# Patient Record
Sex: Male | Born: 1945
Health system: Southern US, Community
[De-identification: ages and names within clinical notes are randomized; demographics above are authoritative.]

## PROBLEM LIST (undated history)

## (undated) DIAGNOSIS — G4710 Hypersomnia, unspecified: Secondary | ICD-10-CM

## (undated) DIAGNOSIS — I4891 Unspecified atrial fibrillation: Secondary | ICD-10-CM

## (undated) DIAGNOSIS — K635 Polyp of colon: Secondary | ICD-10-CM

## (undated) DIAGNOSIS — R0600 Dyspnea, unspecified: Secondary | ICD-10-CM

## (undated) DIAGNOSIS — S43429A Sprain of unspecified rotator cuff capsule, initial encounter: Secondary | ICD-10-CM

## (undated) DIAGNOSIS — R51 Headache: Secondary | ICD-10-CM

## (undated) DIAGNOSIS — S86012A Strain of left Achilles tendon, initial encounter: Secondary | ICD-10-CM

## (undated) DIAGNOSIS — I499 Cardiac arrhythmia, unspecified: Secondary | ICD-10-CM

## (undated) DIAGNOSIS — J329 Chronic sinusitis, unspecified: Secondary | ICD-10-CM

## (undated) DIAGNOSIS — K219 Gastro-esophageal reflux disease without esophagitis: Secondary | ICD-10-CM

## (undated) DIAGNOSIS — S8990XA Unspecified injury of unspecified lower leg, initial encounter: Secondary | ICD-10-CM

## (undated) DIAGNOSIS — L219 Seborrheic dermatitis, unspecified: Secondary | ICD-10-CM

## (undated) DIAGNOSIS — G47419 Narcolepsy without cataplexy: Secondary | ICD-10-CM

## (undated) DIAGNOSIS — M779 Enthesopathy, unspecified: Secondary | ICD-10-CM

## (undated) DIAGNOSIS — I340 Nonrheumatic mitral (valve) insufficiency: Secondary | ICD-10-CM

## (undated) DIAGNOSIS — I509 Heart failure, unspecified: Secondary | ICD-10-CM

## (undated) DIAGNOSIS — M1990 Unspecified osteoarthritis, unspecified site: Secondary | ICD-10-CM

## (undated) DIAGNOSIS — G43909 Migraine, unspecified, not intractable, without status migrainosus: Secondary | ICD-10-CM

## (undated) DIAGNOSIS — K5792 Diverticulitis of intestine, part unspecified, without perforation or abscess without bleeding: Secondary | ICD-10-CM

## (undated) DIAGNOSIS — L570 Actinic keratosis: Secondary | ICD-10-CM

## (undated) DIAGNOSIS — N189 Chronic kidney disease, unspecified: Secondary | ICD-10-CM

## (undated) DIAGNOSIS — R519 Headache, unspecified: Secondary | ICD-10-CM

## (undated) DIAGNOSIS — B159 Hepatitis A without hepatic coma: Secondary | ICD-10-CM

## (undated) DIAGNOSIS — G43109 Migraine with aura, not intractable, without status migrainosus: Secondary | ICD-10-CM

## (undated) DIAGNOSIS — M549 Dorsalgia, unspecified: Secondary | ICD-10-CM

## (undated) DIAGNOSIS — Z8719 Personal history of other diseases of the digestive system: Secondary | ICD-10-CM

## (undated) DIAGNOSIS — R52 Pain, unspecified: Secondary | ICD-10-CM

## (undated) DIAGNOSIS — B001 Herpesviral vesicular dermatitis: Secondary | ICD-10-CM

## (undated) HISTORY — PX: UPPER GASTROINTESTINAL ENDOSCOPY: SHX188

## (undated) HISTORY — DX: Migraine, unspecified, not intractable, without status migrainosus: G43.909

## (undated) HISTORY — DX: Migraine with aura, not intractable, without status migrainosus: G43.109

## (undated) HISTORY — DX: Seborrheic dermatitis, unspecified: L21.9

## (undated) HISTORY — DX: Herpesviral vesicular dermatitis: B00.1

## (undated) HISTORY — DX: Diverticulitis of intestine, part unspecified, without perforation or abscess without bleeding: K57.92

## (undated) HISTORY — DX: Hepatitis a without hepatic coma: B15.9

## (undated) HISTORY — DX: Dyspnea, unspecified: R06.00

## (undated) HISTORY — DX: Unspecified injury of unspecified lower leg, initial encounter: S89.90XA

## (undated) HISTORY — DX: Unspecified osteoarthritis, unspecified site: M19.90

## (undated) HISTORY — PX: COLONOSCOPY: SHX174

## (undated) HISTORY — DX: Gastro-esophageal reflux disease without esophagitis: K21.9

## (undated) HISTORY — DX: Pain, unspecified: R52

## (undated) HISTORY — PX: VASECTOMY: SHX75

## (undated) HISTORY — DX: Strain of left Achilles tendon, initial encounter: S86.012A

## (undated) HISTORY — PX: FINGER SURGERY: SHX640

## (undated) HISTORY — DX: Hypersomnia, unspecified: G47.10

## (undated) HISTORY — DX: Narcolepsy without cataplexy: G47.419

## (undated) HISTORY — DX: Headache, unspecified: R51.9

## (undated) HISTORY — PX: LASIK: SHX215

## (undated) HISTORY — DX: Dorsalgia, unspecified: M54.9

## (undated) HISTORY — DX: Actinic keratosis: L57.0

## (undated) HISTORY — DX: Sprain of unspecified rotator cuff capsule, initial encounter: S43.429A

## (undated) HISTORY — DX: Headache: R51

## (undated) HISTORY — DX: Enthesopathy, unspecified: M77.9

## (undated) HISTORY — DX: Chronic sinusitis, unspecified: J32.9

---
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
## 1983-09-14 DIAGNOSIS — B159 Hepatitis A without hepatic coma: Secondary | ICD-10-CM

## 1983-09-14 HISTORY — DX: Hepatitis a without hepatic coma: B15.9

## 1999-02-05 ENCOUNTER — Ambulatory Visit (HOSPITAL_COMMUNITY): Admission: RE | Admit: 1999-02-05 | Discharge: 1999-02-05 | Payer: Self-pay | Admitting: Gastroenterology

## 1999-07-08 ENCOUNTER — Encounter: Payer: Self-pay | Admitting: Neurology

## 1999-07-08 ENCOUNTER — Encounter: Admission: RE | Admit: 1999-07-08 | Discharge: 1999-07-08 | Payer: Self-pay | Admitting: Neurology

## 2000-09-13 DIAGNOSIS — S8990XA Unspecified injury of unspecified lower leg, initial encounter: Secondary | ICD-10-CM

## 2000-09-13 HISTORY — DX: Unspecified injury of unspecified lower leg, initial encounter: S89.90XA

## 2000-12-13 ENCOUNTER — Encounter: Admission: RE | Admit: 2000-12-13 | Discharge: 2000-12-13 | Payer: Self-pay | Admitting: Neurology

## 2000-12-13 ENCOUNTER — Encounter: Payer: Self-pay | Admitting: Neurology

## 2001-01-01 ENCOUNTER — Encounter: Payer: Self-pay | Admitting: Emergency Medicine

## 2001-01-01 ENCOUNTER — Emergency Department (HOSPITAL_COMMUNITY): Admission: EM | Admit: 2001-01-01 | Discharge: 2001-01-01 | Payer: Self-pay | Admitting: Emergency Medicine

## 2001-01-06 ENCOUNTER — Encounter: Payer: Self-pay | Admitting: Orthopaedic Surgery

## 2001-01-06 ENCOUNTER — Ambulatory Visit (HOSPITAL_COMMUNITY): Admission: RE | Admit: 2001-01-06 | Discharge: 2001-01-06 | Payer: Self-pay | Admitting: Orthopaedic Surgery

## 2004-06-05 ENCOUNTER — Ambulatory Visit (HOSPITAL_COMMUNITY): Admission: RE | Admit: 2004-06-05 | Discharge: 2004-06-05 | Payer: Self-pay | Admitting: Gastroenterology

## 2004-08-05 ENCOUNTER — Ambulatory Visit (HOSPITAL_COMMUNITY): Admission: RE | Admit: 2004-08-05 | Discharge: 2004-08-05 | Payer: Self-pay | Admitting: Gastroenterology

## 2004-12-29 ENCOUNTER — Encounter: Admission: RE | Admit: 2004-12-29 | Discharge: 2004-12-29 | Payer: Self-pay | Admitting: Diagnostic Radiology

## 2007-07-28 ENCOUNTER — Ambulatory Visit (HOSPITAL_BASED_OUTPATIENT_CLINIC_OR_DEPARTMENT_OTHER): Admission: RE | Admit: 2007-07-28 | Discharge: 2007-07-28 | Payer: Self-pay | Admitting: Orthopedic Surgery

## 2011-01-26 NOTE — Op Note (Signed)
NAME:  Christopher Weber, Christopher Weber               ACCOUNT NO.:  [id number]   MEDICAL RECORD NO.:  [id number]          PATIENT TYPE:  AMB   LOCATION:  DSC                          FACILITY:  [facility]   PHYSICIAN:  Katy Fitch. Sypher, M.D. DATE OF BIRTH:  27-Jun-1946   DATE OF PROCEDURE:  07/28/2007  DATE OF DISCHARGE:                               OPERATIVE REPORT   PREOPERATIVE DIAGNOSIS:  Chronic stenosing tenosynovitis of left long  finger at A1 pulley.   POSTOPERATIVE DIAGNOSIS:  Chronic stenosing tenosynovitis of left long  finger at A1 pulley.   OPERATION:  Release of left long finger A1 pulley under 2% lidocaine  field block anesthesia and sedation.   OPERATING SURGEON:  Katy Fitch. Sypher, M.D.   ASSISTANT:  Molly Maduro Dasnoit, PA-C   ANESTHESIA:  2% lidocaine flexor sheath block and palmar block, left  hand, to obtain anesthesia of the left long finger.   SUPERVISING ANESTHESIOLOGIST:  Janetta Hora. Gelene Mink, M.D.   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   He has had at least two prior steroid injections without longstanding  relief.   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   Due to episodes of awakening with his finger in a flexed posture and  difficulty with comfort while playing golf, he presents at this time for  release of his left long finger A1 pulley.   PROCEDURE:  [name] was brought to the operating room and placed in  the supine position on the operating table.   Following light sedation, the left arm was prepped with Betadine soap  and solution and sterilely draped.  A pneumatic tourniquet was applied  to the proximal brachium.   Following exsanguination of the left arm with Esmarch bandage, the  arterial tourniquet was inflated to 220 mmHg.  2% lidocaine was  infiltrated in the path of  the intended incision and into the flexor  sheath of the left long finger.   The procedure commenced with a short oblique incision in the distal  palmar crease just proximal to the A1 pulley.   The subcutaneous tissues were carefully divided, taking care to release  the pretendinous fibers of the palmar fascia to the long finger.  The A1  pulley was isolated and all soft tissues cleared with a Therapist, nutritional.  Blunt retractors were placed to protect the neurovascular structures.  The A1 pulley was then released along its radial border and a limited  synovectomy accomplished at the synovial reflection proximally.  Thereafter, Dr. Babs Sciara demonstrated full active range of motion was  finger with only minimal crepitation proximal to the A1 pulley.   The wound was then repaired with mattress suture of 5-0 nylon x3.   A compressive dressing was applied with Xeroflo, sterile gauze, and Ace  wrap.   There were no apparent complications.   For aftercare, we have encouraged him to continue to work on flexion and  extension  exercises for the next week.  He will return to our office for  followup in a week for dressing change, suture removal, and advancement  to an exercise program.      Katy Fitch. Sypher, M.D.  Electronically Signed     RVS/MEDQ  D:  07/28/2007  T:  07/28/2007  Job:  [id number]

## 2011-06-07 ENCOUNTER — Other Ambulatory Visit (INDEPENDENT_AMBULATORY_CARE_PROVIDER_SITE_OTHER): Payer: Self-pay | Admitting: General Surgery

## 2011-06-07 ENCOUNTER — Encounter (INDEPENDENT_AMBULATORY_CARE_PROVIDER_SITE_OTHER): Payer: Self-pay | Admitting: General Surgery

## 2011-06-07 ENCOUNTER — Ambulatory Visit (INDEPENDENT_AMBULATORY_CARE_PROVIDER_SITE_OTHER): Payer: BC Managed Care – PPO | Admitting: General Surgery

## 2011-06-07 VITALS — BP 130/88 | HR 60 | Temp 97.0°F | Resp 16 | Ht 69.0 in | Wt 171.1 lb

## 2011-06-07 DIAGNOSIS — D235 Other benign neoplasm of skin of trunk: Secondary | ICD-10-CM

## 2011-06-07 DIAGNOSIS — L723 Sebaceous cyst: Secondary | ICD-10-CM

## 2011-06-07 DIAGNOSIS — L720 Epidermal cyst: Secondary | ICD-10-CM

## 2011-06-07 NOTE — Patient Instructions (Signed)
Keep the wound clean and dry. Change the dry gauze dressing once or twice daily. Removedthe gauze packing in 48-672 hours and did not repack. You may shower the day after you remove the packing. The sutures should should be removed in 10-14 days. Call me if there are any problems.

## 2011-06-07 NOTE — Progress Notes (Signed)
Chief Complaint  Patient presents with  . Other    exision of cyst on back      HPI  Christopher Weber is a 65 y.o. male.    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  He says this has been there for some time and recently he has had 2 episodes where became very small swollen tender and drained. He called 2 or 3 weeks ago a set up an appointment for me to excise this in the office today.  He is otherwise very healthy. HPI  Past Medical History  Diagnosis Date  . Arthritis   . GERD (gastroesophageal reflux disease)   . Generalized headaches     Past Surgical History  Procedure Date  . Lasik   . Hernia repair 1963    right inguinal   . Tonsillectomy and adenoidectomy 1959  . Finger surgery 2009/2010    trigger finger of left hand      Family History  Problem Relation Age of Onset  . Heart disease Father 26    Social History History  Substance Use Topics  . Smoking status: Never Smoker   . Smokeless tobacco: Never Used  . Alcohol Use: 4.2 oz/week    7 Cans of beer per week    Allergies  Allergen Reactions  . Tetracyclines & Related     photosensitivity - can take medication but just must avoid being out in the sun     Current Outpatient Prescriptions  Medication Sig Dispense Refill  . ranitidine (ZANTAC) 150 MG tablet Take 150 mg by mouth daily.          Review of Systems Review of Systems  12 system review of systems is performed and is negative except as described above. Blood pressure 130/88, pulse 60, temperature 97 F (36.1 C), resp. rate 16, height 5\' 9"  (1.753 m), weight 171 lb 2 oz (77.622 kg).  Physical Exam Physical Exam  Constitutional: He appears well-developed and well-nourished. No distress.  HENT:  Head: Normocephalic and atraumatic.  Skin: Skin is warm and dry. No rash noted. He is not diaphoretic. No erythema. No pallor.       Data Reviewednone  Assessment    2 cm chronically infected epidermoid  cyst of MID back.    Plan    Informed consent was provided.  The patient was placed prone on the operating table.Marking pen was used to plan a transverse skin  Incision 4-5 cm. in length.The area was prepped with betadine in a sterile fashion. 1% xylocaine local. Transverse elliptical incision made and the cyst was completely excised. It was inspected I excised all of the cyst  The wounds irrigated with saline.The skin edges were closed loosely with 2 interrupted sutures of  3-0 nylon and packed loosely with iodoform gauze.Hemostasis was good. Dry gauze bandage was placed. Wound care was explained  Dry gauze bandage once or twice daily to expect some drainage  Remove gauze packing in 2 days. .Sutures should be removed in 10-14 days.  His wife is a home health care nurse and she will call if there are problems. Otherwise see me p.r.n.       Juel Bellerose M 06/07/2011, 2:26 PM

## 2011-06-14 ENCOUNTER — Telehealth (INDEPENDENT_AMBULATORY_CARE_PROVIDER_SITE_OTHER): Payer: Self-pay | Admitting: General Surgery

## 2011-06-14 NOTE — Telephone Encounter (Signed)
LM with path results on VM.

## 2012-08-29 ENCOUNTER — Other Ambulatory Visit: Payer: Self-pay | Admitting: Orthopedic Surgery

## 2012-08-30 NOTE — H&P (Signed)
  Patient is a 66 y/o right handed male who recently presented c/o increasing symptoms of STS of the right long finger for the past 2 months. He has tried splinting with out success. He has previously undergone release A-1 pulley of the left long finger in 2008 with excellent post-op results.  Exam of the right hand reveals active triggering of the right long finger. Neurovascular exam is intact. He has full ROM of the remaining digits without triggering. Normal sweat pattern. Impression: Stenosing tenosynovitis right long finger  Plan: To the OR for release A-1 pulley right long finger.The procedure, risks,benefits and post-op course were discussed with the patient at length and they were in agreement with the plan.   Jonni Sanger PA-C  H&P documentation: 08/31/2012  -History and Physical Reviewed  -Patient has been re-examined  -No change in the plan of care  Wyn Forster, MD

## 2012-08-31 ENCOUNTER — Encounter (HOSPITAL_BASED_OUTPATIENT_CLINIC_OR_DEPARTMENT_OTHER): Payer: Self-pay

## 2012-08-31 ENCOUNTER — Encounter (HOSPITAL_BASED_OUTPATIENT_CLINIC_OR_DEPARTMENT_OTHER)
Admission: RE | Disposition: A | Discharge status: Home / Self Care | Payer: Self-pay | Source: Ambulatory Visit | Attending: Orthopedic Surgery

## 2012-08-31 ENCOUNTER — Ambulatory Visit (HOSPITAL_BASED_OUTPATIENT_CLINIC_OR_DEPARTMENT_OTHER)
Admission: RE | Admit: 2012-08-31 | Discharge: 2012-08-31 | Disposition: A | Discharge status: Home / Self Care | Payer: BC Managed Care – PPO | Source: Ambulatory Visit | Attending: Orthopedic Surgery | Admitting: Orthopedic Surgery

## 2012-08-31 DIAGNOSIS — M6530 Trigger finger, unspecified finger: Secondary | ICD-10-CM | POA: Insufficient documentation

## 2012-08-31 HISTORY — PX: TRIGGER FINGER RELEASE: SHX641

## 2012-08-31 SURGERY — MINOR RELEASE TRIGGER FINGER/A-1 PULLEY
Anesthesia: LOCAL | Site: Finger | Laterality: Right | Wound class: Clean

## 2012-08-31 MED ORDER — LIDOCAINE HCL 2 % IJ SOLN
INTRAMUSCULAR | Status: DC | PRN
  Administered 2012-08-31: 4 mL

## 2012-08-31 MED ORDER — ACETAMINOPHEN-CODEINE #3 300-30 MG PO TABS: 1.0000 | ORAL_TABLET | ORAL | Status: DC | PRN

## 2012-08-31 MED ORDER — CHLORHEXIDINE GLUCONATE 4 % EX LIQD: 60.0000 mL | Freq: Once | CUTANEOUS | Status: DC

## 2012-08-31 SURGICAL SUPPLY — 38 items
BANDAGE ADHESIVE 1X3 (GAUZE/BANDAGES/DRESSINGS) IMPLANT
BANDAGE ELASTIC 3 VELCRO ST LF (GAUZE/BANDAGES/DRESSINGS) ×2 IMPLANT
BLADE SURG 15 STRL LF DISP TIS (BLADE) ×1 IMPLANT
BLADE SURG 15 STRL SS (BLADE) ×2
BNDG CMPR 9X4 STRL LF SNTH (GAUZE/BANDAGES/DRESSINGS)
BNDG ESMARK 4X9 LF (GAUZE/BANDAGES/DRESSINGS) IMPLANT
BRUSH SCRUB EZ PLAIN DRY (MISCELLANEOUS) ×2 IMPLANT
CLOTH BEACON ORANGE TIMEOUT ST (SAFETY) ×2 IMPLANT
CORDS BIPOLAR (ELECTRODE) IMPLANT
COVER MAYO STAND STRL (DRAPES) ×2 IMPLANT
COVER TABLE BACK 60X90 (DRAPES) ×2 IMPLANT
CUFF TOURNIQUET SINGLE 18IN (TOURNIQUET CUFF) ×1 IMPLANT
DECANTER SPIKE VIAL GLASS SM (MISCELLANEOUS) IMPLANT
DRAPE EXTREMITY T 121X128X90 (DRAPE) ×2 IMPLANT
DRAPE SURG 17X23 STRL (DRAPES) ×2 IMPLANT
GLOVE BIO SURGEON STRL SZ7.5 (GLOVE) ×2 IMPLANT
GLOVE BIOGEL M STRL SZ7.5 (GLOVE) ×2 IMPLANT
GLOVE BIOGEL PI IND STRL 8 (GLOVE) IMPLANT
GLOVE BIOGEL PI INDICATOR 8 (GLOVE) ×1
GLOVE ORTHO TXT STRL SZ7.5 (GLOVE) ×2 IMPLANT
GOWN PREVENTION PLUS XLARGE (GOWN DISPOSABLE) ×2 IMPLANT
GOWN PREVENTION PLUS XXLARGE (GOWN DISPOSABLE) ×4 IMPLANT
NEEDLE 27GAX1X1/2 (NEEDLE) ×1 IMPLANT
PACK BASIN DAY SURGERY FS (CUSTOM PROCEDURE TRAY) ×2 IMPLANT
PADDING CAST ABS 4INX4YD NS (CAST SUPPLIES) ×1
PADDING CAST ABS COTTON 4X4 ST (CAST SUPPLIES) ×1 IMPLANT
SPONGE GAUZE 4X4 12PLY (GAUZE/BANDAGES/DRESSINGS) ×2 IMPLANT
STOCKINETTE 4X48 STRL (DRAPES) ×2 IMPLANT
STRIP CLOSURE SKIN 1/2X4 (GAUZE/BANDAGES/DRESSINGS) IMPLANT
SUT ETHILON 5 0 P 3 18 (SUTURE)
SUT NYLON ETHILON 5-0 P-3 1X18 (SUTURE) IMPLANT
SUT PROLENE 3 0 PS 2 (SUTURE) IMPLANT
SUT PROLENE 4 0 P 3 18 (SUTURE) ×1 IMPLANT
SYR 3ML 23GX1 SAFETY (SYRINGE) IMPLANT
SYR CONTROL 10ML LL (SYRINGE) ×1 IMPLANT
TRAY DSU PREP LF (CUSTOM PROCEDURE TRAY) ×2 IMPLANT
UNDERPAD 30X30 INCONTINENT (UNDERPADS AND DIAPERS) ×2 IMPLANT
WATER STERILE IRR 1000ML POUR (IV SOLUTION) IMPLANT

## 2012-08-31 NOTE — Op Note (Signed)
[id number] 

## 2012-08-31 NOTE — Brief Op Note (Signed)
08/31/2012  2:06 PM  PATIENT:  Christopher Weber  66 y.o. male  PRE-OPERATIVE DIAGNOSIS:  STS Right Long Finger  POST-OPERATIVE DIAGNOSIS:  STS Right Long Finger  PROCEDURE:  Procedure(s) (LRB) with comments: MINOR RELEASE TRIGGER FINGER/A-1 PULLEY (Right) - right long finger  SURGEON:  Surgeon(s) and Role:    * Wyn Forster., MD - Primary  PHYSICIAN ASSISTANT:   ASSISTANTS: none   ANESTHESIA:   local  EBL:     BLOOD ADMINISTERED:none  DRAINS: none   LOCAL MEDICATIONS USED:  XYLOCAINE   SPECIMEN:  No Specimen  DISPOSITION OF SPECIMEN:  N/A  COUNTS:  YES  TOURNIQUET:   Total Tourniquet Time Documented: Forearm (Right) - 6 minutes  DICTATION: .Other Dictation: Dictation Number [id number]  PLAN OF CARE: Discharge to home after PACU  PATIENT DISPOSITION:  PACU - hemodynamically stable.

## 2012-09-01 ENCOUNTER — Encounter (HOSPITAL_BASED_OUTPATIENT_CLINIC_OR_DEPARTMENT_OTHER): Payer: Self-pay | Admitting: Orthopedic Surgery

## 2012-09-01 NOTE — Op Note (Signed)
NAME:  Christopher Weber, Christopher Weber               ACCOUNT NO.:  [id number]  MEDICAL RECORD NO.:  [id number]  LOCATION:                                 FACILITY:  PHYSICIAN:  Christopher Fitch. Artis Beggs, M.D. DATE OF BIRTH:  May 29, 1946  DATE OF PROCEDURE:  08/31/2012 DATE OF DISCHARGE:                              OPERATIVE REPORT   PREOPERATIVE DIAGNOSIS:  Chronic stenosing tenosynovitis status right long finger.  POSTOPERATIVE DIAGNOSIS:  Chronic stenosing tenosynovitis status right long finger.  OPERATION:  Release of right long finger A1 pulley and incidental synovectomy of superficialis tendon.  SURGEON:  Christopher Fitch. Taletha Twiford, M.D.  ASSISTANT:  None.  ANESTHESIA:  2% lidocaine flexor sheath and metacarpal head level block of right long finger.  This was performed as a minor operating room procedure.  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  He has a history of bilateral stenosing tenosynovitis.  He is status post release of his left long finger A1 pulley in 2006.  He now presents with identical triggering on the right.  Christopher Weber experienced prolonged stiffness of his left long finger PIP joint following his prior surgery, therefore he choose at this time to have surgery in a more timely manner.  Preoperatively, he was reminded the potential risks and benefits of surgery.  Understands to perform this under local anesthesia with him awake, so we could demonstrate active range of motion of his finger.  After informed consent, he was brought to the operating room at this time.  PROCEDURE:  Christopher Weber is brought to room 4 at the [facility] and placed in supine position on the operating table.  After anesthesia, informed consent and Betadine prep 4 mL of 2% lidocaine infiltrated into the path intended incision and the flexor sheath of the right long finger.  Within 5 minutes, excellent anesthesia of the long finger was  achieved.  The right hand and arm were then prepped with Betadine soap and solution, sterilely draped.  A pneumatic tourniquet was applied to the proximal forearm.  Sterile stockinette and impervious arthroscopy drapes were applied.  Following exsanguination of the right hand and forearm by direct compression, the arterial tourniquet was inflated to 135 mmHg due to mild systolic hypertension.  A routine surgical time-out was accomplished.  We then created a 1 cm oblique incision in the distal palmar crease.  Subcutaneous tissues were carefully divided revealing early palmar fibromatosis of the pretendinous fibers.  This was excised with scissors followed by blunt dissection revealing the A1 pulley. Ragnell retractors were placed to protect the neurovascular structures followed by identification of the pulley.  Christopher Weber assisted by hyperextending his MP joints followed by incision of the A1 pulley in its midline.  After complete release of the A1 pulley, the superficialis profundus tendons were inspected.  There was a moderate degree of synovitis on the superficialis tendon.  This removed the microrongeur. Christopher Weber then demonstrated full active range of motion of his long finger without residual triggering.  The wound was inspected for bleeding points.  None were identified.  The skin was then repaired with intradermal 4-0 Prolene suture.  A Steri- Strip  was applied followed by release of the tourniquet.  A compressive dressing was applied with sterile gauze, and Ace wrap.  For aftercare, Christopher Weber is provided a prescription for Tylenol with Codeine No. 3 one p.o. q.4-6 hours p.r.n. pain, 20 tablets without refill.  We will see him back in our office in 1 week for suture removal or sooner p.r.n. problems.     Christopher Weber, M.D.     RVS/MEDQ  D:  08/31/2012  T:  09/01/2012  Job:  [id number]

## 2013-04-12 ENCOUNTER — Encounter: Payer: Self-pay | Admitting: Neurology

## 2013-04-12 ENCOUNTER — Ambulatory Visit (INDEPENDENT_AMBULATORY_CARE_PROVIDER_SITE_OTHER): Payer: BC Managed Care – PPO | Admitting: Neurology

## 2013-04-12 VITALS — BP 107/74 | HR 59 | Resp 16 | Ht 68.0 in | Wt 170.0 lb

## 2013-04-12 DIAGNOSIS — G43909 Migraine, unspecified, not intractable, without status migrainosus: Secondary | ICD-10-CM

## 2013-04-12 DIAGNOSIS — Z79899 Other long term (current) drug therapy: Secondary | ICD-10-CM

## 2013-04-12 DIAGNOSIS — G47429 Narcolepsy in conditions classified elsewhere without cataplexy: Secondary | ICD-10-CM | POA: Insufficient documentation

## 2013-04-12 DIAGNOSIS — G47419 Narcolepsy without cataplexy: Secondary | ICD-10-CM

## 2013-04-12 HISTORY — DX: Narcolepsy without cataplexy: G47.419

## 2013-04-12 MED ORDER — SUMATRIPTAN SUCCINATE 100 MG PO TABS: 100.0000 mg | ORAL_TABLET | Freq: Once | ORAL | Status: DC | PRN

## 2013-04-12 NOTE — Addendum Note (Signed)
Addended by: Melvyn Novas on: 04/12/2013 03:25 PM   Modules accepted: Orders

## 2013-04-12 NOTE — Progress Notes (Signed)
[name]  Provider:  Melvyn Novas, M D  Referring Provider: [name], MD Primary Care Physician:  Pearla Dubonnet, MD  Chief Complaint  Patient presents with  . Follow-up    per EMR,hypersomnia, rm 10    HPI:  Christopher Weber is a 67 y.o. male  Is seen here as a referral/ revisit  from Dr. Kevan Ny for hypersomnia .  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 There were also no clinically significant oxygen desaturations and only moderate snoring was noted not continuously as well. The patient stayed for an MSLT the following day, which revealed a mean sleep latency of only 4.4 minutes also no REM sleep was noted. Based on the persistent hypersomnia complaint and Epworth Sleepiness Scale of 14 points a normal body mass index and normal hypertension or diabetes been present at that time. The diagnosis was clinically without of severe or excessive daytime hypersomnia clinically in the narcolepsy spectrum. The patient had induce sleep paralysis, hypersomnia, sleep attacks, vivid dreams and  intrusions in wakefulness and some severe sleep fragmentation was also reported. He reported.hypnapompic hallucinations.  XYREM had been  Introduced- and the patient reported a reduction in the upper or sleepiness score from 14-4 points and today to 6 points. The regular dose of Xyrem, that was initiated caused some side effects. 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 He stated today that he experiences some migraines, announcing by visual aura of the central scotoma once a month , and  sees "squiggly lines" for about 2 hours, no medicine is taken. Marland Kitchen He may have one a month on average.  10 PM bedtime and up at 5 AM. No longer vivid dreams. He is not waking up with hallucinations 10 times at night.  Hypnapompic and hypnagogic hallucinations are reduced.       Review of Systems: Out of a complete 14 system review, the patient complains of only the following symptoms, and all other reviewed systems are negative.   History   Social History  . Marital Status: Married    Spouse Name: N/A    Number of Children: N/A  . Years of Education: N/A   Occupational History  . Not on file.   Social History Main Topics  . Smoking status: Never Smoker   . Smokeless tobacco: Never Used  . Alcohol Use: 4.2 oz/week    7 Cans of beer per week     Comment: consumes one beer daily  . Drug Use: No  . Sexually Active: Not on file   Other Topics Concern  . Not on file   Social History Narrative  . No narrative on file    Family History  Problem Relation Age of Onset  . Heart disease Father 52  . Hypertension Father   . Cancer Mother     breast,colon,skin  . Heart disease Mother     prostate  . CVA Maternal Grandmother   . Microcephaly Maternal Grandmother   . Microcephaly Paternal Grandfather     Past Medical History  Diagnosis Date  . Arthritis   . GERD (gastroesophageal reflux disease)   . Generalized headaches   . Hepatitis A 1985  . Migraine   . Pain     facial trigemonal  . Hypersomnia   . Diverticulitis   . Dyspnea  on exertion  . Achilles rupture, left   . Tendinitis     left middle finger  . Seborrhea     chronic  . Actinic keratoses   . Sinusitis   . Knee injuries 2002    left  . Back pain     low  . Fever blister     episodically  . Narcolepsy without cataplexy 04/12/2013     MSLT with  MSL 4.4 minutes.     Past Surgical History  Procedure Laterality Date  . Lasik  1998 or 99  . Hernia repair  1963    right inguinal   . Finger surgery  2009/2010    trigger finger of left hand    . Trigger finger release  08/31/2012    Procedure: MINOR RELEASE TRIGGER FINGER/A-1 PULLEY;  Surgeon: Wyn Forster., MD;  Location: [facility];  Service: Orthopedics;  Laterality: Right;  right long finger  . Tonsillectomy and adenoidectomy  1960  . Colonoscopy  2011-last    every 5 years  . Vasectomy      Current Outpatient Prescriptions  Medication Sig Dispense Refill  . Multiple Vitamin (MULTIVITAMIN) capsule Take 1 capsule by mouth daily.      . Omega-3 Fatty Acids (FISH OIL) 500 MG CAPS Take by mouth. As directed      . ranitidine (ZANTAC) 150 MG tablet Take 150 mg by mouth daily.        . Sodium Oxybate (XYREM) 500 MG/ML SOLN Take by mouth. Only 2 grams at night      . valACYclovir (VALTREX) 500 MG tablet Take 500 mg by mouth daily as needed.       No current facility-administered medications for this visit.    Allergies as of 04/12/2013 - Review Complete 04/12/2013  Allergen Reaction Noted  . Tetracyclines & related  06/07/2011  . Doxycycline  04/12/2013  . Dulcolax stool softener (dss)  04/12/2013    Vitals: BP 107/74  Pulse 59  Resp 16  Ht 5\' 8"  (1.727 m)  Wt 170 lb (77.111 kg)  BMI 25.85 kg/m2 Last Weight:  Wt Readings from Last 1 Encounters:  04/12/13 170 lb (77.111 kg)   Last Height:   Ht Readings from Last 1 Encounters:  04/12/13 5\' 8"  (1.727 m)    Physical exam:  General: The patient is awake, alert and appears not in acute distress. The patient is well groomed. Head: Normocephalic, atraumatic. Neck is supple.  Cardiovascular:  Regular rate and rhythm , without  murmurs or carotid bruit, and without distended neck veins. Respiratory: Lungs are clear to auscultation. Skin:  Without evidence of edema, or rash Trunk:  Patient  has normal posture.  Neurologic exam : The patient is awake and alert, oriented to place and time.  Memory subjective described as intact. There is a normal attention span & concentration ability. Speech is fluent without dysarthria, dysphonia or aphasia. Mood and affect are appropriate.  Cranial nerves: Pupils are equal and briskly reactive to light. Funduscopic  exam without  evidence of pallor or edema.  Extraocular movements  in vertical and horizontal planes intact and without nystagmus. Visual fields by finger perimetry are intact. Hearing to finger rub intact.  Facial sensation intact to fine touch.  Facial motor strength is symmetric and tongue and uvula move midline.  Motor exam:  Normal tone and normal muscle bulk and symmetric normal strength in all extremities.  Sensory:  Fine touch, pinprick and vibration were tested  in all extremities. Proprioception is  normal.  Coordination: Rapid alternating movements in the fingers/hands is tested and normal. Finger-to-nose maneuver without evidence of ataxia, dysmetria or tremor.  Gait and station: Patient walks without assistive device .  Assessment:  After physical and neurologic examination, review of laboratory studies, imaging, neurophysiology testing and pre-existing records, assessment is that of clinical narcolepsy.   Plan:  Treatment plan and additional workup : Continue Xyrem at 2 gram twice nightly.

## 2013-04-12 NOTE — Patient Instructions (Signed)
Narcolepsy Narcolepsy is a disabling neurological disorder of sleep regulation. It affects the control of sleep. It also affects the control of wakefulness. It is an interruption of the dreaming state of sleep. This state is known as REM or rapid eye movement sleep.  SYMPTOMS  The development, number, and severity of symptoms vary widely among people with the disorder. Symptoms generally begin between the ages of 15 and 30. The four classic symptoms of the disorder are:   Excessive daytime sleepiness.  Cataplexy. This is sudden, brief episodes of muscle weakness or paralysis. It is caused by strong emotions. Common strong emotions are laughter, anger, surprise, or anticipation.  Sleep paralysis. This is paralysis upon falling asleep or waking up.  Hallucinations. These are vivid dream-like images that occur at when you first fall asleep. Other symptoms include:   Unrelenting excessive sleepiness. This is usually the first and most obvious symptom.  Sleep attacks. Patients have strong sleep attacks throughout the day. These attacks can last for 30 seconds to more than 30 minutes. These happen no matter how much or how well the person slept the night before. These attacks end up making the person sleep at work and social events. The person can fall asleep while eating, talking, and driving. They also fall asleep at other out of place times.  Disturbed nighttime sleep.  Tossing and turning in bed.  Leg jerks.  Nightmares.  Waking up often. DIAGNOSIS  It's possible that genetics play a role in this disorder. Narcolepsy is not a rare disorder. It is often misdiagnosed. It is often diagnosed years after symptoms first appear. Early diagnosis and treatment are important. This help the physical and mental well-being of the patient. TREATMENT  There is no cure for narcolepsy. The symptoms can be controlled with behavioral and medical therapy. The excessive daytime sleepiness may be treated with  stimulant drugs. It may also be treated with the drug modafinil (Provigil). Cataplexy and other REM-sleep symptoms may be treated with antidepressant medications. Medications will reduce the symptoms. Medications will not ease symptoms entirely. Many available medications have side effects. Basic lifestyle changes may also reduce the symptoms. These changes include having regular sleep schedules and scheduled daytime naps. Other lifestyle changes include avoiding "over-stimulating" situations. Document Released: 08/20/2002 Document Revised: 11/22/2011 Document Reviewed: 08/30/2005 [employer] Patient Information 2014 [employer].  

## 2013-04-13 LAB — COMPREHENSIVE METABOLIC PANEL
Albumin/Globulin Ratio: 2.3 (ref 1.1–2.5)
Albumin: 4.6 g/dL (ref 3.6–4.8)
Alkaline Phosphatase: 48 IU/L (ref 39–117)
BUN/Creatinine Ratio: 12 (ref 10–22)
BUN: 15 mg/dL (ref 8–27)
Chloride: 102 mmol/L (ref 97–108)
GFR calc Af Amer: 70 mL/min/{1.73_m2} (ref >59)
GFR calc non Af Amer: 60 mL/min/{1.73_m2} (ref >59)
Globulin, Total: 2 g/dL (ref 1.5–4.5)
Total Bilirubin: 0.5 mg/dL (ref 0.0–1.2)

## 2013-04-13 NOTE — Progress Notes (Signed)
Quick Note:  All normal labs - ______

## 2013-04-18 ENCOUNTER — Telehealth: Payer: Self-pay

## 2013-04-18 NOTE — Telephone Encounter (Signed)
I called patient. He was not home. I spoke with daughter and asked that she have patient call back for results. She asked if it was urgent. I let her know, No. Just call when it is convenient.

## 2013-04-18 NOTE — Telephone Encounter (Signed)
Message copied by [facility] on Wed Apr 18, 2013  4:33 PM ------      Message from: [facility], CARMEN      Created: Fri Apr 13, 2013  4:04 PM       All normal labs - ------

## 2013-07-16 DIAGNOSIS — Z136 Encounter for screening for cardiovascular disorders: Secondary | ICD-10-CM | POA: Diagnosis not present

## 2013-07-16 DIAGNOSIS — H2510 Age-related nuclear cataract, unspecified eye: Secondary | ICD-10-CM | POA: Diagnosis not present

## 2013-07-16 DIAGNOSIS — Z0000 Encounter for general adult medical examination without abnormal findings: Secondary | ICD-10-CM | POA: Diagnosis not present

## 2013-07-16 DIAGNOSIS — K649 Unspecified hemorrhoids: Secondary | ICD-10-CM | POA: Diagnosis not present

## 2013-07-16 DIAGNOSIS — Z23 Encounter for immunization: Secondary | ICD-10-CM | POA: Diagnosis not present

## 2013-07-16 DIAGNOSIS — G47429 Narcolepsy in conditions classified elsewhere without cataplexy: Secondary | ICD-10-CM | POA: Diagnosis not present

## 2013-07-16 DIAGNOSIS — Z125 Encounter for screening for malignant neoplasm of prostate: Secondary | ICD-10-CM | POA: Diagnosis not present

## 2013-07-16 DIAGNOSIS — B009 Herpesviral infection, unspecified: Secondary | ICD-10-CM | POA: Diagnosis not present

## 2013-07-16 DIAGNOSIS — Z79899 Other long term (current) drug therapy: Secondary | ICD-10-CM | POA: Diagnosis not present

## 2013-07-19 ENCOUNTER — Other Ambulatory Visit: Payer: Self-pay

## 2013-07-20 DIAGNOSIS — R3129 Other microscopic hematuria: Secondary | ICD-10-CM | POA: Diagnosis not present

## 2013-08-14 ENCOUNTER — Telehealth: Payer: Self-pay

## 2013-08-14 NOTE — Telephone Encounter (Signed)
Patient called, left message saying he has a new Financial controller.  Says they may require a new Prior Auth for his meds.  He did not leave the new info.  He asked for a return call on his cell at [phone].  I called back, got no answer.  Asked him to call us back with the name of Ins, his ID# and the phone number we need to call if PA is required.

## 2013-08-16 ENCOUNTER — Telehealth: Payer: Self-pay | Admitting: Neurology

## 2013-08-16 NOTE — Telephone Encounter (Signed)
Mr Hemmer has faxed over all of his new ins info.  I called him to confirm we have received the info and will submit PA.  He would like his email address noted in his chart : [email or web address].  By viewing his demographics, this info is already in his chart.

## 2013-08-16 NOTE — Telephone Encounter (Signed)
LMVM that 1/29 appt is CX due to provider not being in and for him to call back and R/S

## 2013-09-13 DIAGNOSIS — K635 Polyp of colon: Secondary | ICD-10-CM

## 2013-09-13 HISTORY — DX: Polyp of colon: K63.5

## 2013-10-11 ENCOUNTER — Ambulatory Visit: Payer: BC Managed Care – PPO | Admitting: Neurology

## 2013-12-20 ENCOUNTER — Other Ambulatory Visit: Payer: Self-pay | Admitting: Neurology

## 2014-02-08 ENCOUNTER — Telehealth: Payer: Self-pay | Admitting: Neurology

## 2014-02-08 NOTE — Telephone Encounter (Signed)
Patient returning call, regarding coming in today between 11:30-2:00.  He received a message stating Dr Brett Fairy had NP in training, but erased the message and doesn't remember the name of person who left message inquiring if he could come in today.  Please call and advise

## 2014-02-15 NOTE — Telephone Encounter (Signed)
Patient has an scheduled an appointment with Dr. Brett Fairy for next week.

## 2014-02-21 ENCOUNTER — Ambulatory Visit (INDEPENDENT_AMBULATORY_CARE_PROVIDER_SITE_OTHER): Payer: Medicare Other | Admitting: Neurology

## 2014-02-21 ENCOUNTER — Encounter: Payer: Self-pay | Admitting: Neurology

## 2014-02-21 VITALS — BP 111/75 | HR 59 | Resp 16 | Ht 67.25 in | Wt 171.0 lb

## 2014-02-21 DIAGNOSIS — G47411 Narcolepsy with cataplexy: Secondary | ICD-10-CM | POA: Diagnosis not present

## 2014-02-21 MED ORDER — SODIUM OXYBATE 500 MG/ML PO SOLN: 2000.0000 mg | Freq: Every evening | ORAL | Status: DC

## 2014-02-21 NOTE — Progress Notes (Signed)
[name]  Provider:  [name], M D  Referring Provider: [name], MD Primary Care Physician:  Henrine Screws, MD  Chief Complaint  Patient presents with  . Follow-up    Room 10  . 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  There were also no clinically significant oxygen desaturations and only moderate snoring was noted not continuously as well. The patient stayed on for an MSLT the following day, which revealed a mean sleep latency of only 4.4 minutes also no REM sleep was noted. Based on the persistent hypersomnia complaint and Epworth Sleepiness Scale of 14 points a normal body mass index and normal hypertension or diabetes been present at that time. The diagnosis was clinically without of severe or excessive daytime hypersomnia clinically in the narcolepsy spectrum. The patient had induce sleep paralysis, hypersomnia, sleep attacks, vivid dreams and  intrusions in wakefulness and some severe sleep fragmentation was also reported. He reported.hypnapompic hallucinations.  XYREM had been  Introduced- and the patient reported a reduction in the upper or sleepiness score from 14-4 points and today to 6 points.  The regular dose of Xyrem, that was initiated caused some side effects. 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  still achieves a reduction in his sleepiness but has no sexual side effects. He stated today that he experiences some migraines, announced by visual aura of the central scotoma now 2-3 times a  month ,but only leading to a headache every 2 month once .  He sees "squiggly lines" for about 2 hours, in that case he will take 50 mg Imitrex and aleve. He still gets vertigo.   Recently he met a childhood friend who told him : "  you were always asleep ;anytime and anywhere " .    Sleep habits unchanged:  XYREM : 10 PM bedtime , promptly going to sleep  and up at 5 AM.  No longer vivid dreams. He is not waking up with hallucinations 10 times at night. Hypnapompic and hypnagogic hallucinations are reduced.    He has a new complaint of left arm numbness when keeping the arm extended for a time, for ex. while biking.  Neck arthritis.    Review of Systems: Out of a complete 14 system review, the patient complains of only the following symptoms, and all other reviewed systems are negative. epworth between 3 and 8    History   Social History  . Marital Status: Married    Spouse Name: GraceAnn    Number of Children: 2  . Years of Education: College   Occupational History  .     Social History Main Topics  . Smoking status: Never Smoker   . Smokeless tobacco: Never Used  . Alcohol Use: 4.2 oz/week    7 Cans of beer per week     Comment: consumes one beer daily  . Drug Use: No  . Sexual Activity: Not on file   Other Topics Concern  . Not on file   Social History Narrative   Patient is married [name]) and lives at home with his wife.   Patient has two adult children.   Patient is working full-time.   Patient has a Financial risk analyst, Production manager.  Patient is right-handed.   Patient drinks two cups of coffee in the morning.    Family History  Problem Relation Age of Onset  . Heart disease Father 26  . Hypertension Father   . Cancer Mother     breast,colon,skin  . Heart disease Mother     prostate  . CVA Maternal Grandmother   . Microcephaly Maternal Grandmother   . Microcephaly Paternal Grandfather     Past Medical History  Diagnosis Date  . Arthritis   . GERD (gastroesophageal reflux disease)   . Generalized headaches   . Hepatitis A 1985  . Migraine   . Pain     facial trigemonal  . Hypersomnia   . Diverticulitis   . Dyspnea     on exertion  . Achilles rupture, left   .  Tendinitis     left middle finger  . Seborrhea     chronic  . Actinic keratoses   . Sinusitis   . Knee injuries 2002    left  . Back pain     low  . Fever blister     episodically  . Narcolepsy without cataplexy 04/12/2013     MSLT with  MSL 4.4 minutes.     Past Surgical History  Procedure Laterality Date  . [place] or 99  . Hernia repair  1963    right inguinal   . Finger surgery  2009/2010    trigger finger of left hand    . Trigger finger release  08/31/2012    Procedure: MINOR RELEASE TRIGGER FINGER/A-1 PULLEY;  Surgeon: Cammie Sickle., MD;  Location: [place];  Service: Orthopedics;  Laterality: Right;  right long finger  . Tonsillectomy and adenoidectomy  1960  . Colonoscopy  2011-last    every 5 years  . Vasectomy      Current Outpatient Prescriptions  Medication Sig Dispense Refill  . Multiple Vitamin (MULTIVITAMIN) capsule Take 1 capsule by mouth daily.      . Omega-3 Fatty Acids (FISH OIL) 500 MG CAPS Take by mouth. As directed      . ranitidine (ZANTAC) 150 MG tablet Take 150 mg by mouth daily.        . Sodium Oxybate (XYREM) 500 MG/ML SOLN Take by mouth. Only 2 grams at night      . SUMAtriptan (IMITREX) 100 MG tablet TAKE 1 TABLET (100 MG TOTAL) BY MOUTH ONCE AS NEEDED FOR MIGRAINE.  10 tablet  3  . valACYclovir (VALTREX) 500 MG tablet Take 500 mg by mouth daily as needed.       No current facility-administered medications for this visit.    Allergies as of 02/21/2014 - Review Complete 02/21/2014  Allergen Reaction Noted  . Tetracyclines & related  06/07/2011  . Doxycycline  04/12/2013  . Dulcolax stool softener [dss]  04/12/2013    Vitals: BP 111/75  Pulse 59  Resp 16  Ht 5' 7.25" (1.708 m)  Wt 171 lb (77.565 kg)  BMI 26.59 kg/m2 Last Weight:  Wt Readings from Last 1 Encounters:  02/21/14 171 lb (77.565 kg)   Last Height:   Ht Readings from Last 1 Encounters:  02/21/14 5' 7.25" (1.708 m)    Physical  exam:  General: The patient is awake, alert and appears not in acute distress. The patient is well groomed. Head: Normocephalic, atraumatic. Neck is supple.  Cardiovascular:  Regular rate and rhythm , without  murmurs or carotid bruit, and without distended neck  veins. Respiratory: Lungs are clear to auscultation. Skin:  Without evidence of edema, or rash Trunk:  Patient  has normal posture.  Neurologic exam : The patient is awake and alert, oriented to place and time.  Memory subjective described as intact. There is a normal attention span & concentration ability. Speech is fluent without dysarthria, dysphonia or aphasia. Mood and affect are appropriate.  Cranial nerves: Pupils are equal and briskly reactive to light. Funduscopic exam without  evidence of pallor or edema.  Extraocular movements  in vertical and horizontal planes intact and without nystagmus. Visual fields by finger perimetry are intact. Hearing to finger rub intact.  Facial sensation intact to fine touch.  Facial motor strength is symmetric and tongue and uvula move midline.  Motor exam:  Normal tone and normal muscle bulk and symmetric normal strength in all extremities.  Sensory:  Fine touch, pinprick and vibration were tested in all extremities. Proprioception is  normal.  Coordination: Rapid alternating movements in the fingers/hands is tested and normal. Finger-to-nose maneuver without evidence of ataxia, dysmetria or tremor.  Gait and station: Patient walks without assistive device .  Assessment:  After physical and neurologic examination, review of laboratory studies, imaging, neurophysiology testing and pre-existing records,  1)assessment is that of clinical narcolepsy. 2) likely cervical spine arthritis- related to the C 5-6  Sensory changes in the right arm .   Plan:  Treatment plan and additional workup : Continue Xyrem at 2 gram twice nightly.  EMG and NCS declined.  Patient wants no MRI of the neck.    Swiss narcolepsy scale at 14 minus.  He still wakes up 3-4 times each night on XYREM 2 gram bid.

## 2014-02-21 NOTE — Patient Instructions (Signed)
Narcolepsy Narcolepsy is a disabling neurological disorder of sleep regulation. It affects the control of sleep. It also affects the control of wakefulness. It is an interruption of the dreaming state of sleep. This state is known as REM or rapid eye movement sleep.  SYMPTOMS  The development, number, and severity of symptoms vary widely among people with the disorder. Symptoms generally begin between the ages of 29 and 26. The four classic symptoms of the disorder are:   Excessive daytime sleepiness.  Cataplexy. This is sudden, brief episodes of muscle weakness or paralysis. It is caused by strong emotions. Common strong emotions are laughter, anger, surprise, or anticipation.  Sleep paralysis. This is paralysis upon falling asleep or waking up.  Hallucinations. These are vivid dream-like images that occur at when you first fall asleep. Other symptoms include:   Unrelenting excessive sleepiness. This is usually the first and most obvious symptom.  Sleep attacks. Patients have strong sleep attacks throughout the day. These attacks can last for 30 seconds to more than 30 minutes. These happen no matter how much or how well the person slept the night before. These attacks end up making the person sleep at work and social events. The person can fall asleep while eating, talking, and driving. They also fall asleep at other out of place times.  Disturbed nighttime sleep.  Tossing and turning in bed.  Leg jerks.  Nightmares.  Waking up often. DIAGNOSIS  It's possible that genetics play a role in this disorder. Narcolepsy is not a rare disorder. It is often misdiagnosed. It is often diagnosed years after symptoms first appear. Early diagnosis and treatment are important. This help the physical and mental well-being of the patient. TREATMENT  There is no cure for narcolepsy. The symptoms can be controlled with behavioral and medical therapy. The excessive daytime sleepiness may be treated with  stimulant drugs. It may also be treated with the drug modafinil (Provigil). Cataplexy and other REM-sleep symptoms may be treated with antidepressant medications. Medications will reduce the symptoms. Medications will not ease symptoms entirely. Many available medications have side effects. Basic lifestyle changes may also reduce the symptoms. These changes include having regular sleep schedules and scheduled daytime naps. Other lifestyle changes include avoiding "over-stimulating" situations. Document Released: 08/20/2002 Document Revised: 11/22/2011 Document Reviewed: 08/30/2005 [facility] Patient Information 2014 [place].

## 2014-02-22 LAB — COMPREHENSIVE METABOLIC PANEL
ALBUMIN: 4.3 g/dL (ref 3.6–4.8)
ALT: 29 IU/L (ref 0–44)
AST: 30 IU/L (ref 0–40)
Albumin/Globulin Ratio: 1.9 (ref 1.1–2.5)
Alkaline Phosphatase: 41 IU/L (ref 39–117)
BUN/Creatinine Ratio: 12 (ref 10–22)
BUN: 16 mg/dL (ref 8–27)
CALCIUM: 9.4 mg/dL (ref 8.6–10.2)
CO2: 26 mmol/L (ref 18–29)
CREATININE: 1.3 mg/dL — AB (ref 0.76–1.27)
Chloride: 103 mmol/L (ref 96–108)
GFR calc Af Amer: 66 mL/min/{1.73_m2} (ref >59)
GFR calc non Af Amer: 57 mL/min/{1.73_m2} — ABNORMAL LOW (ref >59)
GLOBULIN, TOTAL: 2.3 g/dL (ref 1.5–4.5)
Glucose: 87 mg/dL (ref 65–99)
Potassium: 4.3 mmol/L (ref 3.5–5.2)
Sodium: 137 mmol/L (ref 134–144)
TOTAL PROTEIN: 6.6 g/dL (ref 6.0–8.5)
Total Bilirubin: 0.5 mg/dL (ref 0.0–1.2)

## 2014-03-08 DIAGNOSIS — L820 Inflamed seborrheic keratosis: Secondary | ICD-10-CM | POA: Diagnosis not present

## 2014-03-08 DIAGNOSIS — D239 Other benign neoplasm of skin, unspecified: Secondary | ICD-10-CM | POA: Diagnosis not present

## 2014-03-08 DIAGNOSIS — D219 Benign neoplasm of connective and other soft tissue, unspecified: Secondary | ICD-10-CM | POA: Diagnosis not present

## 2014-03-08 DIAGNOSIS — L819 Disorder of pigmentation, unspecified: Secondary | ICD-10-CM | POA: Diagnosis not present

## 2014-03-08 DIAGNOSIS — L821 Other seborrheic keratosis: Secondary | ICD-10-CM | POA: Diagnosis not present

## 2014-03-20 ENCOUNTER — Telehealth: Payer: Self-pay | Admitting: Neurology

## 2014-03-20 ENCOUNTER — Encounter: Payer: Self-pay | Admitting: Neurology

## 2014-03-20 NOTE — Telephone Encounter (Signed)
Spoke to patient regarding rescheduling 08/23/14 appointment per Dr. Edwena Felty schedule, patient verbalized understanding, printed and mailed letter with new appointment time.

## 2014-05-28 ENCOUNTER — Telehealth: Payer: Self-pay | Admitting: Neurology

## 2014-05-28 DIAGNOSIS — G47411 Narcolepsy with cataplexy: Secondary | ICD-10-CM

## 2014-05-28 MED ORDER — SODIUM OXYBATE 500 MG/ML PO SOLN: ORAL | Status: DC

## 2014-05-28 NOTE — Telephone Encounter (Signed)
Patient calling to state that he has increased his dosage of Xyrem to 3 grams per night and is requesting the script to be changed to reflect that. Please call and advise.

## 2014-05-28 NOTE — Telephone Encounter (Signed)
Patient called saying he has increased the Xyrem dose from 2 grams twice nightly to 3 grams.  He is requesting a new Rx for this dose.  Please advise.  Thank you.

## 2014-06-14 DIAGNOSIS — S92524A Nondisplaced fracture of medial phalanx of right lesser toe(s), initial encounter for closed fracture: Secondary | ICD-10-CM | POA: Diagnosis not present

## 2014-07-24 DIAGNOSIS — H2513 Age-related nuclear cataract, bilateral: Secondary | ICD-10-CM | POA: Diagnosis not present

## 2014-07-24 DIAGNOSIS — D2311 Other benign neoplasm of skin of right eyelid, including canthus: Secondary | ICD-10-CM | POA: Diagnosis not present

## 2014-07-25 DIAGNOSIS — K219 Gastro-esophageal reflux disease without esophagitis: Secondary | ICD-10-CM | POA: Diagnosis not present

## 2014-07-25 DIAGNOSIS — G4753 Recurrent isolated sleep paralysis: Secondary | ICD-10-CM | POA: Diagnosis not present

## 2014-07-25 DIAGNOSIS — Z13220 Encounter for screening for lipoid disorders: Secondary | ICD-10-CM | POA: Diagnosis not present

## 2014-07-25 DIAGNOSIS — Z23 Encounter for immunization: Secondary | ICD-10-CM | POA: Diagnosis not present

## 2014-07-25 DIAGNOSIS — Z125 Encounter for screening for malignant neoplasm of prostate: Secondary | ICD-10-CM | POA: Diagnosis not present

## 2014-07-25 DIAGNOSIS — B009 Herpesviral infection, unspecified: Secondary | ICD-10-CM | POA: Diagnosis not present

## 2014-07-25 DIAGNOSIS — G43109 Migraine with aura, not intractable, without status migrainosus: Secondary | ICD-10-CM | POA: Diagnosis not present

## 2014-07-25 DIAGNOSIS — Z0000 Encounter for general adult medical examination without abnormal findings: Secondary | ICD-10-CM | POA: Diagnosis not present

## 2014-07-25 DIAGNOSIS — Z79899 Other long term (current) drug therapy: Secondary | ICD-10-CM | POA: Diagnosis not present

## 2014-07-25 DIAGNOSIS — R312 Other microscopic hematuria: Secondary | ICD-10-CM | POA: Diagnosis not present

## 2014-07-25 DIAGNOSIS — G47429 Narcolepsy in conditions classified elsewhere without cataplexy: Secondary | ICD-10-CM | POA: Diagnosis not present

## 2014-07-25 DIAGNOSIS — N529 Male erectile dysfunction, unspecified: Secondary | ICD-10-CM | POA: Diagnosis not present

## 2014-08-12 ENCOUNTER — Telehealth: Payer: Self-pay | Admitting: *Deleted

## 2014-08-12 NOTE — Telephone Encounter (Signed)
LMVM for pt that was following up on call for appt rescheduling for 08-29-14.  Did relay that spoke to Dr. Brett Fairy about 1200 appt and this was ok.  Will change to 08-29-14 at 1200.  Pt to call back if not ok.

## 2014-08-23 ENCOUNTER — Ambulatory Visit: Payer: Medicare Other | Admitting: Neurology

## 2014-08-29 ENCOUNTER — Ambulatory Visit: Payer: Medicare Other | Admitting: Neurology

## 2014-08-29 ENCOUNTER — Ambulatory Visit (INDEPENDENT_AMBULATORY_CARE_PROVIDER_SITE_OTHER): Payer: Medicare Other | Admitting: Neurology

## 2014-08-29 ENCOUNTER — Encounter: Payer: Self-pay | Admitting: Neurology

## 2014-08-29 VITALS — BP 120/77 | HR 59 | Resp 12 | Ht 67.5 in | Wt 172.0 lb

## 2014-08-29 DIAGNOSIS — G47419 Narcolepsy without cataplexy: Secondary | ICD-10-CM | POA: Diagnosis not present

## 2014-08-29 DIAGNOSIS — S43429A Sprain of unspecified rotator cuff capsule, initial encounter: Secondary | ICD-10-CM

## 2014-08-29 DIAGNOSIS — G43109 Migraine with aura, not intractable, without status migrainosus: Secondary | ICD-10-CM

## 2014-08-29 DIAGNOSIS — G47411 Narcolepsy with cataplexy: Secondary | ICD-10-CM

## 2014-08-29 DIAGNOSIS — S43421D Sprain of right rotator cuff capsule, subsequent encounter: Secondary | ICD-10-CM

## 2014-08-29 HISTORY — DX: Narcolepsy without cataplexy: G47.419

## 2014-08-29 HISTORY — DX: Migraine with aura, not intractable, without status migrainosus: G43.109

## 2014-08-29 HISTORY — DX: Sprain of unspecified rotator cuff capsule, initial encounter: S43.429A

## 2014-08-29 MED ORDER — XYREM 500 MG/ML PO SOLN: ORAL | Status: DC

## 2014-08-29 NOTE — Progress Notes (Signed)
[name]  Provider:  [name], M D  Referring Provider: [name], MD Primary Care Physician:  Henrine Screws, MD  Chief Complaint  Patient presents with  . 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  Dr. [name] underwent a polysomnography on 11/26/2011, revealing only mild sleep disordered breathing.  There were also no clinically significant oxygen desaturations and only moderate snoring was noted- not continuously.  The patient stayed on for an MSLT the following day, which revealed a mean sleep latency of only 4.4 minutes also no REM sleep was noted. Based on the persistent hypersomnia complaint and Epworth Sleepiness Scale of 14 points a normal body mass index and normal hypertension or diabetes been present at that time. The diagnosis was clinically without of severe or excessive daytime hypersomnia clinically in the narcolepsy spectrum. The patient had induce sleep paralysis, hypersomnia, sleep attacks, vivid dreams and  intrusions in wakefulness and some severe sleep fragmentation was also reported. He reported.hypnapompic hallucinations.  XYREM had been  Introduced- and the patient reported a reduction in the upper or sleepiness score from 14-4 points and today to 6 points.  The regular dose of Xyrem, that was initiated caused some side effects. 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  still achieves a reduction in his sleepiness but has no sexual side effects. He stated today that he experiences some migraines, announced by visual aura of the central scotoma now 2-3 times a  month ,but only leading to a headache every 2 month once .  He sees "squiggly lines" for about 2 hours, in that case he will take 50 mg Imitrex and Aleve. He still gets vertigo.   Recently he met a  childhood friend who told him : " you were always asleep ;anytime and anywhere " .  Sleep habits unchanged:  XYREM : 10 PM bedtime , promptly going to sleep  and up at 5 AM.  No longer vivid dreams. He is not waking up with hallucinations 10 times at night. Hypnapompic and hypnagogic hallucinations are reduced.  He has a new complaint of left arm numbness when keeping the arm extended for a time, for ex. while biking.  Neck arthritis.   Interval history : 08-29-14 " if I take more Xyrem I sleep better, but have erectile dysfunction. If I don't take enough, my libido is fine but I an prone to vertigo".  He developed a way to take xyrem at 11 Pm and goes to bed at 9 Pm, the second at 1.30 AM- no hang over. His daughter was married October, in an interfaith marriage. He is doing well. No alcohol .     Review of Systems: Out of a complete 14 system review, the patient complains of only the following symptoms, and all other reviewed systems are negative. epworth  8 points. FSS 13 points .  Never able to sleep on planes or in cars.  Sonata taken when on long plane trips.    History   Social History  . Marital Status: Married    Spouse Name: Christopher Weber    Number of Children: 2  . Years of Education: College   Occupational History  .     Social History Main Topics  . Smoking  status: Never Smoker   . Smokeless tobacco: Never Used  . Alcohol Use: 4.2 oz/week    7 Cans of beer per week     Comment: consumes one beer daily  . Drug Use: No  . Sexual Activity: Not on file   Other Topics Concern  . Not on file   Social History Narrative   Patient is married Christopher Weber) and lives at home with his wife.   Patient has two adult children.   Patient is working full-time.   Patient has a Financial risk analyst, Production manager.   Patient is right-handed.   Patient drinks two cups of coffee in the morning.    Family History  Problem Relation Age of Onset  . Heart disease Father 15  . Hypertension  Father   . Cancer Mother     breast,colon,skin  . Heart disease Mother     prostate  . CVA Maternal Grandmother   . Microcephaly Maternal Grandmother   . Microcephaly Paternal Grandfather     Past Medical History  Diagnosis Date  . Arthritis   . GERD (gastroesophageal reflux disease)   . Generalized headaches   . Hepatitis A 1985  . Migraine   . Pain     facial trigemonal  . Hypersomnia   . Diverticulitis   . Dyspnea     on exertion  . Achilles rupture, left   . Tendinitis     left middle finger  . Seborrhea     chronic  . Actinic keratoses   . Sinusitis   . Knee injuries 2002    left  . Back pain     low  . Fever blister     episodically  . Narcolepsy without cataplexy 04/12/2013     MSLT with  MSL 4.4 minutes.     Past Surgical History  Procedure Laterality Date  . [place] or 99  . Hernia repair  1963    right inguinal   . Finger surgery  2009/2010    trigger finger of left hand    . Trigger finger release  08/31/2012    Procedure: MINOR RELEASE TRIGGER FINGER/A-1 PULLEY;  Surgeon: Cammie Sickle., MD;  Location: [place];  Service: Orthopedics;  Laterality: Right;  right long finger  . Tonsillectomy and adenoidectomy  1960  . Colonoscopy  2011-last    every 5 years  . Vasectomy      Current Outpatient Prescriptions  Medication Sig Dispense Refill  . Multiple Vitamin (MULTIVITAMIN) capsule Take 1 capsule by mouth daily.    . Omega-3 Fatty Acids (FISH OIL) 500 MG CAPS Take by mouth. As directed    . ranitidine (ZANTAC) 150 MG tablet Take 150 mg by mouth daily.      . Sodium Oxybate (XYREM) 500 MG/ML SOLN 3 grams liquid XYREM  at night 500 mL 3  . SUMAtriptan (IMITREX) 100 MG tablet TAKE 1 TABLET (100 MG TOTAL) BY MOUTH ONCE AS NEEDED FOR MIGRAINE. 10 tablet 3  . valACYclovir (VALTREX) 500 MG tablet Take 500 mg by mouth daily as needed.     No current facility-administered medications for this visit.    Allergies as of  08/29/2014 - Review Complete 08/29/2014  Allergen Reaction Noted  . Tetracyclines & related  06/07/2011  . Doxycycline  04/12/2013  . Dulcolax stool softener [dss]  04/12/2013    Vitals: BP 120/77 mmHg  Pulse 59  Resp 12  Ht 5' 7.5" (1.715 m)  Wt 172 lb (  78.019 kg)  BMI 26.53 kg/m2 Last Weight:  Wt Readings from Last 1 Encounters:  08/29/14 172 lb (78.019 kg)   Last Height:   Ht Readings from Last 1 Encounters:  08/29/14 5' 7.5" (1.715 m)    Physical exam:  General: The patient is awake, alert and appears not in acute distress. The patient is well groomed. Head: Normocephalic, atraumatic. Neck is supple.  Cardiovascular:  Regular rate and rhythm , without  murmurs or carotid bruit, and without distended neck veins. Respiratory: Lungs are clear to auscultation. Skin:  Without evidence of edema, or rash Trunk:  Patient  has normal posture.  Neurologic exam : The patient is awake and alert, oriented to place and time.   Memory subjective described as intact. There is a normal attention span & concentration ability.  Speech is fluent without dysarthria, dysphonia or aphasia. Mood and affect are appropriate.  Cranial nerves: Pupils are equal and briskly reactive to light. Funduscopic exam without  evidence of pallor or edema.  Extraocular movements  in vertical and horizontal planes intact and without nystagmus. Visual fields by finger perimetry are intact. Hearing to finger rub intact.  Facial sensation intact to fine touch.  Facial motor strength is symmetric and tongue and uvula move midline.  Motor exam:  Normal tone and symmetric normal strength in all extremities.  Sensory:  Fine touch, pinprick and vibration were tested in all extremities. Proprioception is  normal.  Coordination: Rapid alternating movements in the fingers/hands is tested and normal.  Finger-to-nose maneuver without evidence of ataxia, dysmetria or tremor.  Gait and station: Patient walks without  assistive device . No instability .   Assessment:  After physical and neurologic examination, review of laboratory studies, imaging, neurophysiology testing and pre-existing records,    Labs were provided through 07-25-14  Creatinine elevated for 20 years, eye exam normal.  1) assessment is that of  Narcolepsy , without cataplexy. 2)Sensory changes in the right arm . Related to position in bed, cannot sleep with extended arm on the right. Ulnar neuropathy. 3) vertigo and headaches, visual aura for migraine. Responding to triptan and ibuprofen. Vertigo is part of his migraine.    Plan:  Treatment plan and additional workup :  Continue Xyrem at 2 gram twice nightly.     Swiss narcolepsy scale at 14 minus.  He still wakes up 3-4 times each night on XYREM 2 gram bid.

## 2014-08-29 NOTE — Patient Instructions (Signed)
Narcolepsy Narcolepsy is a nervous system disorder that causes daytime sleepiness and sudden bouts of irresistible sleep during the day (sleep attacks). Many people with narcolepsy live with extreme daytime sleepiness for many years before being diagnosed and treated. Narcolepsy is a lifelong (chronic) disorder.  You normally go through cycles when you sleep. When your sleep becomes deeper, you have less body movement, and you start dreaming. This type of deep sleep should happen after about 90 minutes of lighter sleep. Deep sleep is called rapid eye movement (REM) sleep. When you have narcolepsy, REM is not well regulated. It can happen as soon as you fall asleep, and components of REM sleep can occur during the day when you are awake. Uncontrolled REM sleep causes symptoms of narcolepsy. CAUSES Narcolepsy may be caused by an abnormality with a chemical messenger (neurotransmitter) in your brain that controls your sleep and wake cycles. Most people with narcolepsy have low levels of the neurotransmitter hypocretin. Hypocretin is important for controlling wakefulness.  A hypocretin imbalance may be caused by abnormal genes that are passed down through families. It may also develop if the body's defense system (immune system) mistakenly attacks the brain cells that produce hypocretin (autoimmune disease). RISK FACTORS You may be at higher risk for narcolepsy if you have a family history of the disease. Other risk factors that may contribute include:  Injuries, tumors, or infections in the areas of the brain that control sleep.  Exposure to toxins.  Stress.  Hormones produced during puberty and menopause.  Poor sleep habits. SIGNS AND SYMPTOMS  Symptoms of narcolepsy can start at any age but usually begin when people are between the ages of 7 and 25 years. There are four major symptoms. Not everyone with narcolepsy will have all four.   Excessive daytime sleepiness. This is the most common symptom  and is usually the first symptom you will notice.  You may feel as if you are in a mental fog.  Daytime sleepiness may severely affect your performance at work or school.  You may fall asleep during a conversation or while eating dinner.  Sudden loss of muscle tone (cataplexy). You do not lose consciousness, but you may suddenly lose muscle control. When this occurs, your speech may become slurred, or your knees may buckle. This symptom is usually triggered by surprise, anger, or laughter.  Sleep paralysis. You may lose the ability to speak or move just as you start to fall asleep or wake up. You will be aware of the paralysis. It usually lasts for just a few seconds or minutes.  Vivid hallucinations. These may occur with sleep paralysis. The hallucinations are like having bizarre or frightening dreams while you are still awake. Other symptoms may include:   Trouble staying asleep at night (insomnia).  Restless sleep.  Feeling a strong urge to get up at night to smoke or eat. DIAGNOSIS  Your health care provider can diagnose narcolepsy based on your symptoms and the results of two diagnostic tests. You may also be asked to keep a sleep diary for several weeks. You may need the tests if you have had daytime sleepiness for at least 3 months. The two tests are:   A polysomnogram to find out how well your REM sleep is regulated at night. This test is an overnight sleep study. It measures your heart rate, breathing, movement, and brain waves.  A multiple sleep latency test (MSLT) to find out how well your REM sleep is regulated during the day. This   is a daytime sleep study. You may need to take several naps during the day. This test also measures your heart rate, breathing, movement, and brain waves. TREATMENT  There is no cure for narcolepsy, but treatment can be very effective in helping manage the condition. Treatment may include:  Lifestyle and sleeping strategies to help cope with the  condition.  Medicines. These may include:  Stimulant medicines to fight daytime sleepiness.  Antidepressant medicines to treat cataplexy.  Sodium oxybate. This is a strong sedative that you take at night. It can help daytime sleepiness and cataplexy. HOME CARE INSTRUCTIONS  Take all medicines as directed by your health care provider.  Follow these sleep practices:  Try to get about 8 hours of sleep every night.  Go to sleep and get up close to the same time every day.  Keep your bedroom dark, quiet, and comfortable.  Schedule short naps for when you feel sleepiest during the day. Tell your employer or teachers that you have narcolepsy. You may be able to adjust your schedule to include time for naps.  Try to get at least 20 minutes of exercise every day. This will help you sleep better at night and reduce daytime sleepiness.  Do not drink alcohol or caffeinated beverages within 4-5 hours of bedtime.  Do not eat a heavy meal before bedtime. Eat at about the same times every day.  Do not smoke.  Do not drive if you are sleepy or have untreated narcolepsy.  Do not swim or go out on the water without a life jacket. SEEK MEDICAL CARE IF: Your medicines are not controlling your narcolepsy symptoms. SEEK IMMEDIATE MEDICAL CARE IF:  You hurt yourself during a sleep attack or an attack of cataplexy.  You have chest pain or trouble breathing. Document Released: 08/20/2002 Document Revised: 01/14/2014 Document Reviewed: 08/22/2013 ExitCare Patient Information 2015 [employer]. This information is not intended to replace advice given to you by your health care provider. Make sure you discuss any questions you have with your health care provider.  

## 2014-09-09 ENCOUNTER — Encounter: Payer: Self-pay | Admitting: Neurology

## 2014-10-04 ENCOUNTER — Other Ambulatory Visit: Payer: Self-pay | Admitting: Neurology

## 2014-10-11 ENCOUNTER — Other Ambulatory Visit: Payer: Self-pay | Admitting: Gastroenterology

## 2014-10-11 DIAGNOSIS — D123 Benign neoplasm of transverse colon: Secondary | ICD-10-CM | POA: Diagnosis not present

## 2014-10-11 DIAGNOSIS — Z1211 Encounter for screening for malignant neoplasm of colon: Secondary | ICD-10-CM | POA: Diagnosis not present

## 2014-10-11 DIAGNOSIS — K635 Polyp of colon: Secondary | ICD-10-CM | POA: Diagnosis not present

## 2014-10-11 DIAGNOSIS — K5730 Diverticulosis of large intestine without perforation or abscess without bleeding: Secondary | ICD-10-CM | POA: Diagnosis not present

## 2014-10-11 DIAGNOSIS — D126 Benign neoplasm of colon, unspecified: Secondary | ICD-10-CM | POA: Diagnosis not present

## 2014-10-11 DIAGNOSIS — Z8371 Family history of colonic polyps: Secondary | ICD-10-CM | POA: Diagnosis not present

## 2014-10-11 DIAGNOSIS — Z800 Family history of malignant neoplasm of digestive organs: Secondary | ICD-10-CM | POA: Diagnosis not present

## 2014-10-11 DIAGNOSIS — D122 Benign neoplasm of ascending colon: Secondary | ICD-10-CM | POA: Diagnosis not present

## 2015-01-15 ENCOUNTER — Telehealth: Payer: Self-pay | Admitting: Neurology

## 2015-01-15 NOTE — Telephone Encounter (Signed)
Christopher Weber called and stated that the patient has been approved for the Rx. XYREM 500 MG/ML SOLN for one year starting today. She said that she has already let the patient know so there is no need to call him but she wanted you to know.

## 2015-01-15 NOTE — Telephone Encounter (Signed)
Thank you!  [employer] has approved the request for coverage on Xyrem effective until 01/15/2015 Ref # [facility]

## 2015-02-27 ENCOUNTER — Ambulatory Visit (INDEPENDENT_AMBULATORY_CARE_PROVIDER_SITE_OTHER): Payer: Medicare Other | Admitting: Neurology

## 2015-02-27 ENCOUNTER — Encounter: Payer: Self-pay | Admitting: Neurology

## 2015-02-27 ENCOUNTER — Other Ambulatory Visit: Payer: Self-pay

## 2015-02-27 VITALS — BP 104/64 | HR 72 | Resp 20 | Ht 66.93 in | Wt 167.0 lb

## 2015-02-27 DIAGNOSIS — G47419 Narcolepsy without cataplexy: Secondary | ICD-10-CM

## 2015-02-27 DIAGNOSIS — S43421D Sprain of right rotator cuff capsule, subsequent encounter: Secondary | ICD-10-CM

## 2015-02-27 DIAGNOSIS — G43109 Migraine with aura, not intractable, without status migrainosus: Secondary | ICD-10-CM

## 2015-02-27 DIAGNOSIS — G47411 Narcolepsy with cataplexy: Secondary | ICD-10-CM | POA: Diagnosis not present

## 2015-02-27 DIAGNOSIS — Z5181 Encounter for therapeutic drug level monitoring: Secondary | ICD-10-CM

## 2015-02-27 MED ORDER — SUMATRIPTAN SUCCINATE 100 MG PO TABS: 100.0000 mg | ORAL_TABLET | ORAL | Status: DC | PRN

## 2015-02-27 MED ORDER — XYREM 500 MG/ML PO SOLN: ORAL | Status: DC

## 2015-02-27 MED ORDER — ZALEPLON 10 MG PO CAPS: 10.0000 mg | ORAL_CAPSULE | Freq: Every evening | ORAL | Status: DC | PRN

## 2015-02-27 NOTE — Patient Instructions (Signed)
Narcolepsy Narcolepsy is a nervous system disorder that causes daytime sleepiness and sudden bouts of irresistible sleep during the day (sleep attacks). Many people with narcolepsy live with extreme daytime sleepiness for many years before being diagnosed and treated. Narcolepsy is a lifelong (chronic) disorder.  You normally go through cycles when you sleep. When your sleep becomes deeper, you have less body movement, and you start dreaming. This type of deep sleep should happen after about 90 minutes of lighter sleep. Deep sleep is called rapid eye movement (REM) sleep. When you have narcolepsy, REM is not well regulated. It can happen as soon as you fall asleep, and components of REM sleep can occur during the day when you are awake. Uncontrolled REM sleep causes symptoms of narcolepsy. CAUSES Narcolepsy may be caused by an abnormality with a chemical messenger (neurotransmitter) in your brain that controls your sleep and wake cycles. Most people with narcolepsy have low levels of the neurotransmitter hypocretin. Hypocretin is important for controlling wakefulness.  A hypocretin imbalance may be caused by abnormal genes that are passed down through families. It may also develop if the body's defense system (immune system) mistakenly attacks the brain cells that produce hypocretin (autoimmune disease). RISK FACTORS You may be at higher risk for narcolepsy if you have a family history of the disease. Other risk factors that may contribute include:  Injuries, tumors, or infections in the areas of the brain that control sleep.  Exposure to toxins.  Stress.  Hormones produced during puberty and menopause.  Poor sleep habits. SIGNS AND SYMPTOMS  Symptoms of narcolepsy can start at any age but usually begin when people are between the ages of 7 and 25 years. There are four major symptoms. Not everyone with narcolepsy will have all four.   Excessive daytime sleepiness. This is the most common symptom  and is usually the first symptom you will notice.  You may feel as if you are in a mental fog.  Daytime sleepiness may severely affect your performance at work or school.  You may fall asleep during a conversation or while eating dinner.  Sudden loss of muscle tone (cataplexy). You do not lose consciousness, but you may suddenly lose muscle control. When this occurs, your speech may become slurred, or your knees may buckle. This symptom is usually triggered by surprise, anger, or laughter.  Sleep paralysis. You may lose the ability to speak or move just as you start to fall asleep or wake up. You will be aware of the paralysis. It usually lasts for just a few seconds or minutes.  Vivid hallucinations. These may occur with sleep paralysis. The hallucinations are like having bizarre or frightening dreams while you are still awake. Other symptoms may include:   Trouble staying asleep at night (insomnia).  Restless sleep.  Feeling a strong urge to get up at night to smoke or eat. DIAGNOSIS  Your health care provider can diagnose narcolepsy based on your symptoms and the results of two diagnostic tests. You may also be asked to keep a sleep diary for several weeks. You may need the tests if you have had daytime sleepiness for at least 3 months. The two tests are:   A polysomnogram to find out how well your REM sleep is regulated at night. This test is an overnight sleep study. It measures your heart rate, breathing, movement, and brain waves.  A multiple sleep latency test (MSLT) to find out how well your REM sleep is regulated during the day. This   is a daytime sleep study. You may need to take several naps during the day. This test also measures your heart rate, breathing, movement, and brain waves. TREATMENT  There is no cure for narcolepsy, but treatment can be very effective in helping manage the condition. Treatment may include:  Lifestyle and sleeping strategies to help cope with the  condition.  Medicines. These may include:  Stimulant medicines to fight daytime sleepiness.  Antidepressant medicines to treat cataplexy.  Sodium oxybate. This is a strong sedative that you take at night. It can help daytime sleepiness and cataplexy. HOME CARE INSTRUCTIONS  Take all medicines as directed by your health care provider.  Follow these sleep practices:  Try to get about 8 hours of sleep every night.  Go to sleep and get up close to the same time every day.  Keep your bedroom dark, quiet, and comfortable.  Schedule short naps for when you feel sleepiest during the day. Tell your employer or teachers that you have narcolepsy. You may be able to adjust your schedule to include time for naps.  Try to get at least 20 minutes of exercise every day. This will help you sleep better at night and reduce daytime sleepiness.  Do not drink alcohol or caffeinated beverages within 4-5 hours of bedtime.  Do not eat a heavy meal before bedtime. Eat at about the same times every day.  Do not smoke.  Do not drive if you are sleepy or have untreated narcolepsy.  Do not swim or go out on the water without a life jacket. SEEK MEDICAL CARE IF: Your medicines are not controlling your narcolepsy symptoms. SEEK IMMEDIATE MEDICAL CARE IF:  You hurt yourself during a sleep attack or an attack of cataplexy.  You have chest pain or trouble breathing. Document Released: 08/20/2002 Document Revised: 01/14/2014 Document Reviewed: 08/22/2013 ExitCare Patient Information 2015 [employer]. This information is not intended to replace advice given to you by your health care provider. Make sure you discuss any questions you have with your health care provider.  

## 2015-02-27 NOTE — Telephone Encounter (Signed)
Rx signed and faxed.

## 2015-02-27 NOTE — Progress Notes (Signed)
[name]  Provider:  Larey Weber, Christopher Weber  Referring Provider: Josetta Weber, Christopher Weber Primary Care Physician:  Christopher Weber, Christopher Weber  Chief Complaint  Patient presents with  . 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  Christopher Weber underwent a polysomnography on 11/26/2011, revealing only mild sleep disordered breathing. There were also no clinically significant oxygen desaturations and only moderate snoring was noted- not continuously. The patient stayed on for an MSLT the following day, which revealed a mean sleep latency of only 4.4 minutes also no REM sleep was noted. Based on the persistent hypersomnia complaint and Epworth Sleepiness Scale of 14 points a normal body mass index and normal hypertension or diabetes been present at that time. The diagnosis was clinically without of severe or excessive daytime hypersomnia clinically in the narcolepsy spectrum. The patient had induce sleep paralysis, hypersomnia, sleep attacks, vivid dreams and  intrusions in wakefulness and some severe sleep fragmentation was also reported. He reported.hypnapompic hallucinations.   XYREM had been introduced- and the patient reported a reduction in the upper or sleepiness score from 14-4 points and today to 6 points.  The regular dose of Xyrem, that was initiated caused some side effects. Based on this,  Christopher Weber is now taking a rather low  Dose  of Xyrem at night, 2 gram bid.  still achieves a reduction in his sleepiness but has no sexual side effects. He stated today that he experiences some migraines, announced by visual aura of the central scotoma now 2-3 times a  month ,but only leading to a headache every 2 month once .  He sees "squiggly lines" for about 2 hours, in that case he will take 50 mg Imitrex and Aleve. He still gets vertigo.  Recently he met a  childhood friend who told him : " you were always asleep ;anytime and anywhere " .  Sleep habits unchanged:  XYREM : 10 PM bedtime , promptly going to sleep  and up at 5 AM.  No longer vivid dreams. He is not waking up with hallucinations 10 times at night. Hypnapompic and hypnagogic hallucinations are reduced.  He has a new complaint of left arm numbness when keeping the arm extended for a time, for ex. while biking.  Neck arthritis.   Interval history : 08-29-14 " if I take more Xyrem I sleep better, but have erectile dysfunction. If I don't take enough, my libido is fine but I an prone to vertigo".  He developed a way to take xyrem at 11 Pm and goes to bed at 9 Pm, the second at 1.30 AM- no hang over. His daughter was married October, in an interfaith marriage. He is doing well. No alcohol.   Interval 02-27-15 Patient is planning a trip to [place] and had questions about how to get rest on the pain especially in light of him being a Xyrem user. I encouraged him to use Sonata,  He would not be a candidate for bit summer. His Epworth sleepiness score today is endorsed at 7 points his geriatric depression score was 1.  I will also fill the paperwork necessary to enroll the patient in the so-called REMS program. He is still working as a Teacher, music, he is still driving to several [place] centers. He is not falling asleep in the car, plane etc.  Today repeated labs.  LFT.      Review of Systems: Out of a complete 14 system review, the patient complains of only the following symptoms, and all other reviewed systems are negative. epworth  7 points. FSS 13 points . GDS 1 .  Never able to sleep on planes or in cars.  Sonata taken when on long plane trips.    History   Social History  . Marital Status: Married    Spouse Name: Christopher Weber  . Number of Children: 2  . Years of Education: College   Occupational History  .     Social History Main Topics  . Smoking status: Never Smoker   . Smokeless  tobacco: Never Used  . Alcohol Use: 4.2 oz/week    7 Cans of beer per week     Comment: consumes one beer daily  . Drug Use: No  . Sexual Activity: Not on file   Other Topics Concern  . Not on file   Social History Narrative   Patient is married Christopher Weber) and lives at home with his wife.   Patient has two adult children.   Patient is working full-time.   Patient has a Financial risk analyst, Production manager.   Patient is right-handed.   Patient drinks two cups of coffee in the morning.    Family History  Problem Relation Age of Onset  . Heart disease Father 93  . Hypertension Father   . Cancer Mother     breast,colon,skin  . Heart disease Mother     prostate  . CVA Maternal Grandmother   . Microcephaly Maternal Grandmother   . Microcephaly Paternal Grandfather     Past Medical History  Diagnosis Date  . Arthritis   . GERD (gastroesophageal reflux disease)   . Generalized headaches   . Hepatitis A 1985  . Migraine   . Pain     facial trigemonal  . Hypersomnia   . Diverticulitis   . Dyspnea     on exertion  . Achilles rupture, left   . Tendinitis     left middle finger  . Seborrhea     chronic  . Actinic keratoses   . Sinusitis   . Knee injuries 2002    left  . Back pain     low  . Fever blister     episodically  . Narcolepsy without cataplexy 04/12/2013     MSLT with  MSL 4.4 minutes.   . Controlled narcolepsy 08/29/2014  . Rotator cuff (capsule) sprain 08/29/2014  . Migraine headache with aura 08/29/2014    Past Surgical History  Procedure Laterality Date  . [place] or 99  . Hernia repair  1963    right inguinal   . Finger surgery  2009/2010    trigger finger of left hand    . Trigger finger release  08/31/2012    Procedure: MINOR RELEASE TRIGGER FINGER/A-1 PULLEY;  Surgeon: Cammie Sickle., Christopher Weber;  Location: [facility];  Service: Orthopedics;  Laterality: Right;  right long finger  . Tonsillectomy and adenoidectomy  1960  .  Colonoscopy  2011-last    every 5 years  . Vasectomy      Current Outpatient Prescriptions  Medication Sig Dispense Refill  . Multiple Vitamin (MULTIVITAMIN) capsule Take 1 capsule by mouth daily.    . Omega-3 Fatty Acids (FISH OIL) 500 MG CAPS Take by mouth. As directed    . ranitidine (ZANTAC) 150 MG tablet Take 150 mg by mouth daily.      Marland Kitchen  SUMAtriptan (IMITREX) 100 MG tablet TAKE 1 TABLET (100 MG TOTAL) BY MOUTH ONCE AS NEEDED FOR MIGRAINE. 10 tablet 6  . valACYclovir (VALTREX) 500 MG tablet Take 500 mg by mouth daily as needed.    Gust Brooms 500 MG/ML SOLN 2 grams liquid XYREM  Twice at night 1 Bottle 5   No current facility-administered medications for this visit.    Allergies as of 02/27/2015 - Review Complete 02/27/2015  Allergen Reaction Noted  . Tetracyclines & related  06/07/2011  . Doxycycline  04/12/2013  . Dulcolax stool softener [dss]  04/12/2013    Vitals: BP 104/64 mmHg  Pulse 72  Resp 20  Ht 5' 6.93" (1.7 Christopher)  Wt 167 lb (75.751 kg)  BMI 26.21 kg/m2 Last Weight:  Wt Readings from Last 1 Encounters:  02/27/15 167 lb (75.751 kg)   Last Height:   Ht Readings from Last 1 Encounters:  02/27/15 5' 6.93" (1.7 Christopher)    Physical exam:  General: The patient is awake, alert and appears not in acute distress. The patient is well groomed. Head: Normocephalic, atraumatic. Neck is supple.  Cardiovascular:  Regular rate and rhythm , without  murmurs or carotid bruit, and without distended neck veins. Respiratory: Lungs are clear to auscultation. Skin:  Without evidence of edema, or rash Trunk:  Patient  has normal posture.  Neurologic exam : The patient is awake and alert, oriented to place and time.   Memory subjective described as intact. There is a normal attention span & concentration ability.  Speech is fluent without dysarthria, dysphonia or aphasia. Mood and affect are appropriate. Cranial nerves: Pupils are equal and briskly reactive to light. Funduscopic exam  without  evidence of pallor or edema.  Extraocular movements  in vertical and horizontal planes intact and without nystagmus. Visual fields by finger perimetry are intact. Hearing to finger rub intact.  Facial sensation intact to fine touch. Facial motor strength is symmetric and tongue and uvula move midline. Motor exam:  Normal tone and symmetric normal strength in all extremities. Sensory:  Fine touch, pinprick and vibration were tested in all extremities. Proprioception is  normal. Coordination: Rapid alternating movements in the fingers/hands is tested and normal. Finger-to-nose maneuver without evidence of ataxia, dysmetria or tremor. Gait and station: Patient walks without assistive device . No instability .   Assessment:  After physical and neurologic examination, review of laboratory studies, imaging, neurophysiology testing and pre-existing records,    Labs were provided through 07-25-14, Creatinine elevated for 20 years, eye exam normal.  1) assessment is that of Narcolepsy , without cataplexy. 2)Sensory changes in the right arm . Related to position in bed, cannot sleep with extended arm on the right. Ulnar neuropathy. 3) vertigo and headaches, visual aura for migraine. Responding to triptan and ibuprofen. Vertigo is part of his migraine.    Plan:  Treatment plan and additional workup :  Continue Xyrem at 2 gram twice nightly.     Swiss narcolepsy scale at 16 minus.  He still wakes up 2-3  times each night on XYREM 2 gram bid. First wakes up after 1.5 hours of sleep, again after 3 hours of sleep.  He will use sonata on the long flight to [facility]

## 2015-02-28 LAB — COMPREHENSIVE METABOLIC PANEL
ALK PHOS: 42 IU/L (ref 39–117)
ALT: 27 IU/L (ref 0–44)
AST: 28 IU/L (ref 0–40)
Albumin/Globulin Ratio: 2.1 (ref 1.1–2.5)
Albumin: 4.4 g/dL (ref 3.6–4.8)
BILIRUBIN TOTAL: 0.7 mg/dL (ref 0.0–1.2)
BUN / CREAT RATIO: 11 (ref 10–22)
BUN: 16 mg/dL (ref 8–27)
CHLORIDE: 103 mmol/L (ref 97–108)
CO2: 25 mmol/L (ref 18–29)
CREATININE: 1.48 mg/dL — AB (ref 0.76–1.27)
Calcium: 10 mg/dL (ref 8.6–10.2)
GFR calc non Af Amer: 49 mL/min/{1.73_m2} — ABNORMAL LOW (ref >59)
GFR, EST AFRICAN AMERICAN: 56 mL/min/{1.73_m2} — AB (ref >59)
GLOBULIN, TOTAL: 2.1 g/dL (ref 1.5–4.5)
Glucose: 91 mg/dL (ref 65–99)
Potassium: 5 mmol/L (ref 3.5–5.2)
SODIUM: 141 mmol/L (ref 134–144)
Total Protein: 6.5 g/dL (ref 6.0–8.5)

## 2015-03-03 ENCOUNTER — Telehealth: Payer: Self-pay

## 2015-03-03 NOTE — Telephone Encounter (Signed)
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 GFR lower than 1 year ago. Please inform him, he knows about the creatinnine for many years.

## 2015-03-03 NOTE — Telephone Encounter (Signed)
Called pt to give him lab results. No answer, left a message asking him to call me back.

## 2015-03-04 NOTE — Telephone Encounter (Signed)
Spoke to pt. He said he was able to see the results in mychart, and is aware of his creatinine and GFR.

## 2015-03-04 NOTE — Telephone Encounter (Signed)
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 GFR lower than 1 year ago. Please inform him, he knows about the creatinnine for many years.

## 2015-03-04 NOTE — Telephone Encounter (Signed)
Patient called returning Kristen's call.

## 2015-03-04 NOTE — Telephone Encounter (Signed)
Attempted to call pt again re: results. No answer.

## 2015-03-10 ENCOUNTER — Other Ambulatory Visit: Payer: Self-pay

## 2015-04-01 ENCOUNTER — Telehealth: Payer: Self-pay | Admitting: Neurology

## 2015-04-01 DIAGNOSIS — L03019 Cellulitis of unspecified finger: Secondary | ICD-10-CM | POA: Diagnosis not present

## 2015-04-01 NOTE — Telephone Encounter (Signed)
Christopher Weber called and requested to speak with someone regarding a medication interaction with wine. Please call and advise.

## 2015-04-01 NOTE — Telephone Encounter (Signed)
I called back.  Spoke with Vaughan Basta.  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  They will proceed with order as prescribed.

## 2015-04-04 DIAGNOSIS — B009 Herpesviral infection, unspecified: Secondary | ICD-10-CM | POA: Diagnosis not present

## 2015-04-09 ENCOUNTER — Telehealth: Payer: Self-pay

## 2015-04-09 NOTE — Telephone Encounter (Signed)
Xyrem has confirmed the patient is enrolled with the current Xyrem REMS Program.

## 2015-07-23 ENCOUNTER — Other Ambulatory Visit: Payer: Self-pay

## 2015-07-23 DIAGNOSIS — G43109 Migraine with aura, not intractable, without status migrainosus: Secondary | ICD-10-CM

## 2015-07-23 DIAGNOSIS — S43421D Sprain of right rotator cuff capsule, subsequent encounter: Secondary | ICD-10-CM

## 2015-07-23 DIAGNOSIS — G47411 Narcolepsy with cataplexy: Secondary | ICD-10-CM

## 2015-07-23 DIAGNOSIS — G47419 Narcolepsy without cataplexy: Secondary | ICD-10-CM

## 2015-07-23 MED ORDER — XYREM 500 MG/ML PO SOLN: ORAL | Status: DC

## 2015-07-23 NOTE — Telephone Encounter (Signed)
Rx signed and faxed.

## 2015-07-28 DIAGNOSIS — N529 Male erectile dysfunction, unspecified: Secondary | ICD-10-CM | POA: Diagnosis not present

## 2015-07-28 DIAGNOSIS — Z79899 Other long term (current) drug therapy: Secondary | ICD-10-CM | POA: Diagnosis not present

## 2015-07-28 DIAGNOSIS — B009 Herpesviral infection, unspecified: Secondary | ICD-10-CM | POA: Diagnosis not present

## 2015-07-28 DIAGNOSIS — Z1389 Encounter for screening for other disorder: Secondary | ICD-10-CM | POA: Diagnosis not present

## 2015-07-28 DIAGNOSIS — K219 Gastro-esophageal reflux disease without esophagitis: Secondary | ICD-10-CM | POA: Diagnosis not present

## 2015-07-28 DIAGNOSIS — R3129 Other microscopic hematuria: Secondary | ICD-10-CM | POA: Diagnosis not present

## 2015-07-28 DIAGNOSIS — G43109 Migraine with aura, not intractable, without status migrainosus: Secondary | ICD-10-CM | POA: Diagnosis not present

## 2015-07-28 DIAGNOSIS — Z23 Encounter for immunization: Secondary | ICD-10-CM | POA: Diagnosis not present

## 2015-07-28 DIAGNOSIS — Z0001 Encounter for general adult medical examination with abnormal findings: Secondary | ICD-10-CM | POA: Diagnosis not present

## 2015-07-28 DIAGNOSIS — G47429 Narcolepsy in conditions classified elsewhere without cataplexy: Secondary | ICD-10-CM | POA: Diagnosis not present

## 2015-07-28 DIAGNOSIS — D126 Benign neoplasm of colon, unspecified: Secondary | ICD-10-CM | POA: Diagnosis not present

## 2015-07-28 DIAGNOSIS — Z125 Encounter for screening for malignant neoplasm of prostate: Secondary | ICD-10-CM | POA: Diagnosis not present

## 2015-08-01 ENCOUNTER — Other Ambulatory Visit: Payer: Self-pay | Admitting: Internal Medicine

## 2015-08-01 DIAGNOSIS — R319 Hematuria, unspecified: Secondary | ICD-10-CM

## 2015-08-05 DIAGNOSIS — R3129 Other microscopic hematuria: Secondary | ICD-10-CM | POA: Diagnosis not present

## 2015-08-14 ENCOUNTER — Ambulatory Visit
Admission: RE | Admit: 2015-08-14 | Discharge: 2015-08-14 | Disposition: A | Discharge status: Home / Self Care | Payer: Medicare Other | Source: Ambulatory Visit | Attending: Internal Medicine | Admitting: Internal Medicine

## 2015-08-14 DIAGNOSIS — R319 Hematuria, unspecified: Secondary | ICD-10-CM | POA: Diagnosis not present

## 2015-08-14 DIAGNOSIS — N281 Cyst of kidney, acquired: Secondary | ICD-10-CM | POA: Diagnosis not present

## 2015-08-28 ENCOUNTER — Ambulatory Visit (INDEPENDENT_AMBULATORY_CARE_PROVIDER_SITE_OTHER): Payer: Medicare Other | Admitting: Neurology

## 2015-08-28 ENCOUNTER — Encounter: Payer: Self-pay | Admitting: Neurology

## 2015-08-28 VITALS — BP 100/64 | HR 66 | Resp 20 | Ht 66.0 in | Wt 168.0 lb

## 2015-08-28 DIAGNOSIS — Z79899 Other long term (current) drug therapy: Secondary | ICD-10-CM | POA: Diagnosis not present

## 2015-08-28 DIAGNOSIS — G43109 Migraine with aura, not intractable, without status migrainosus: Secondary | ICD-10-CM | POA: Diagnosis not present

## 2015-08-28 DIAGNOSIS — S43421D Sprain of right rotator cuff capsule, subsequent encounter: Secondary | ICD-10-CM

## 2015-08-28 DIAGNOSIS — G47419 Narcolepsy without cataplexy: Secondary | ICD-10-CM | POA: Diagnosis not present

## 2015-08-28 DIAGNOSIS — G47411 Narcolepsy with cataplexy: Secondary | ICD-10-CM | POA: Diagnosis not present

## 2015-08-28 MED ORDER — XYREM 500 MG/ML PO SOLN: ORAL | Status: DC

## 2015-08-28 NOTE — Progress Notes (Signed)
[name]  Provider:  [name], M D  Referring Provider: [name], MD Primary Care Physician:  Henrine Screws, MD  Chief Complaint  Patient presents with  . 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 There were also no clinically significant oxygen desaturations and only moderate snoring was noted- not continuously. The patient stayed on for an MSLT the following day, which revealed a mean sleep latency of only 4.4 minutes also no REM sleep was noted. Based on the persistent hypersomnia complaint and Epworth Sleepiness Scale of 14 points a normal body mass index and normal hypertension or diabetes been present at that time. The diagnosis was clinically without of severe or excessive daytime hypersomnia clinically in the narcolepsy spectrum. The patient had induce sleep paralysis, hypersomnia, sleep attacks, vivid dreams and  intrusions in wakefulness and some severe sleep fragmentation was also reported. He reported.hypnapompic hallucinations.   XYREM had been introduced- and the patient reported a reduction in the upper or sleepiness score from 14-4 points and today to 6 points.  The regular dose of Xyrem, that was initiated caused some side effects. Based on this, [name] is now taking a rather low dose  of Xyrem at night, 2 gram bid.  still achieves a reduction in his sleepiness but has no sexual side effects. He stated today that he experiences some migraines, announced by visual aura of the central scotoma now 2-3 times a  month ,but only leading to a headache every 2 month once .  He sees "squiggly lines" for about 2 hours, in that case he will take 50 mg Imitrex and Aleve. He still gets vertigo.  Recently he met a childhood  friend who told him : " you were always asleep ;anytime and anywhere " .  Sleep habits unchanged:  XYREM : 10 PM bedtime , promptly going to sleep  and up at 5 AM.  No longer vivid dreams. He is not waking up with hallucinations 10 times at night. Hypnapompic and hypnagogic hallucinations are reduced.  He has a new complaint of left arm numbness when keeping the arm extended for a time, for ex. while biking.  Neck arthritis.   Interval history : 08-29-14 " if I take more Xyrem I sleep better, but have erectile dysfunction. If I don't take enough, my libido is fine but I an prone to vertigo".  He developed a way to take xyrem at 11 Pm and goes to bed at 9 Pm, the second at 1.30 AM- no hang over. His daughter was married October, in an interfaith marriage. He is doing well. No alcohol.   Interval 02-27-15 Patient is planning a trip to [place] and had questions about how to get rest on the pain especially in light of him being a Xyrem user. I encouraged him to use Sonata,  He would not be a candidate for bit summer. His Epworth sleepiness score today is endorsed at 7 points his geriatric depression score was 1.  I will also fill the paperwork necessary to enroll the patient in the so-called REMS program. 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 He is not falling asleep in the car, plane etc.  Today repeated labs. LFT.  Interval history from 08-28-15 Patient doing well on 2.5 gram twice nightly of XYREM. Did well with Sonata on his flight to [place], was  finally able to sleep on a plane.  He has been diagnosed with a herpetic infection on his right thumb , He was placed on Valtrex. Had taken this 4 years ago. He has noted that the valtrex keeps trigeminal pain and Vertigo away !!  [name] also provided his last laboratory tests from Dr. Inda Merlin primary care office for me. Labs were normal. White blood count subchondral differential, he had microhematuria,  ultrasound of the kidney was negative ultrasound of the bladder was negative. Flow cytometry was negative. I also found the recent metabolic panel obtained on 02-27-15. His labs show normal liver function tests but an elevated creatinine of 1.48. Ago his creatinine was 1.3 the patient is a elevated creatinine levels. 20 years.     Review of Systems: Out of a complete 14 system review, the patient complains of only the following symptoms, and all other reviewed systems are negative. epworth  7 points. FSS 13 points . GDS 1 .  Never able to sleep on planes or in cars.  Sonata taken when on long plane trips.  Herpetic cause of narcolepsy - sleepiness. ?   Social History   Social History  . Marital Status: Married    Spouse Name: [name]  . Number of Children: 2  . Years of Education: College   Occupational History  .     Social History Main Topics  . Smoking status: Never Smoker   . Smokeless tobacco: Never Used  . Alcohol Use: 4.2 oz/week    7 Cans of beer per week     Comment: consumes one beer daily  . Drug Use: No  . Sexual Activity: Not on file   Other Topics Concern  . Not on file   Social History Narrative   Patient is married [name]) and lives at home with his wife.   Patient has two adult children.   Patient is working full-time.   Patient has a Financial risk analyst, Production manager.   Patient is right-handed.   Patient drinks two cups of coffee in the morning.    Family History  Problem Relation Age of Onset  . Heart disease Father 45  . Hypertension Father   . Cancer Mother     breast,colon,skin  . Heart disease Mother     prostate  . CVA Maternal Grandmother   . Microcephaly Maternal Grandmother   . Microcephaly Paternal Grandfather     Past Medical History  Diagnosis Date  . Arthritis   . GERD (gastroesophageal reflux disease)   . Generalized headaches   . Hepatitis A 1985  . Migraine   . Pain     facial trigemonal  . Hypersomnia   .  Diverticulitis   . Dyspnea     on exertion  . Achilles rupture, left   . Tendinitis     left middle finger  . Seborrhea     chronic  . Actinic keratoses   . Sinusitis   . Knee injuries 2002    left  . Back pain     low  . Fever blister     episodically  . Narcolepsy without cataplexy 04/12/2013     MSLT with  MSL 4.4 minutes.   . Controlled narcolepsy 08/29/2014  . Rotator cuff (capsule) sprain 08/29/2014  . Migraine headache with aura 08/29/2014    Past Surgical History  Procedure Laterality Date  . [place] or 99  . Hernia repair  1963    right inguinal   . Finger surgery  2009/2010    trigger finger of left hand    . Trigger finger release  08/31/2012    Procedure: MINOR RELEASE TRIGGER FINGER/A-1 PULLEY;  Surgeon: Cammie Sickle., MD;  Location: [facility];  Service: Orthopedics;  Laterality: Right;  right long finger  . Tonsillectomy and adenoidectomy  1960  . Colonoscopy  2011-last    every 5 years  . Vasectomy      Current Outpatient Prescriptions  Medication Sig Dispense Refill  . Multiple Vitamin (MULTIVITAMIN) capsule Take 1 capsule by mouth daily.    . Omega-3 Fatty Acids (FISH OIL) 500 MG CAPS Take by mouth. As directed    . ranitidine (ZANTAC) 150 MG tablet Take 150 mg by mouth daily.      . SUMAtriptan (IMITREX) 100 MG tablet Take 1 tablet (100 mg total) by mouth every 2 (two) hours as needed for migraine. May repeat in 2 hours if headache persists or recurs. 10 tablet 6  . valACYclovir (VALTREX) 500 MG tablet Take 500 mg by mouth daily.     Gust Brooms 500 MG/ML SOLN 2 grams liquid XYREM  Twice at night 1 Bottle 5  . zaleplon (SONATA) 10 MG capsule Take 1 capsule (10 mg total) by mouth at bedtime as needed for sleep. 30 capsule 1   No current facility-administered medications for this visit.    Allergies as of 08/28/2015 - Review Complete 08/28/2015  Allergen Reaction Noted  . Tetracyclines & related  06/07/2011  . Doxycycline   04/12/2013  . Dulcolax stool softener [dss]  04/12/2013    Vitals: BP 100/64 mmHg  Pulse 66  Resp 20  Ht '5\' 6"'$  (1.676 m)  Wt 168 lb (76.204 kg)  BMI 27.13 kg/m2 Last Weight:  Wt Readings from Last 1 Encounters:  08/28/15 168 lb (76.204 kg)   Last Height:   Ht Readings from Last 1 Encounters:  08/28/15 '5\' 6"'$  (1.676 m)    Physical exam:  General: The patient is awake, alert and appears not in acute distress. The patient is well groomed. Head: Normocephalic, atraumatic. Neck is supple.  Cardiovascular:  Regular rate and rhythm , without  murmurs or carotid bruit, and without distended neck veins. Respiratory: Lungs are clear to auscultation. Skin:  Without evidence of edema, or rash Trunk:  Patient  has normal posture.  Neurologic exam : The patient is awake and alert, oriented to place and time.   Memory subjective described as intact. There is a normal attention span & concentration ability.  Speech is fluent without dysarthria, dysphonia or aphasia. Mood and affect are appropriate. Cranial nerves: Pupils are equal and briskly reactive to light. Funduscopic exam without  evidence of pallor or edema.  Extraocular movements  in vertical and horizontal planes intact and without nystagmus. Visual fields by finger perimetry are intact. Hearing to finger rub intact.  Facial sensation intact to fine touch. Facial motor strength is symmetric and tongue and uvula move midline. Motor exam:  Normal tone and symmetric normal strength in all extremities. Sensory:  Fine touch, pinprick and vibration were tested in all extremities. Proprioception is  normal. Coordination: Rapid alternating movements in the fingers/hands is tested and normal. Finger-to-nose maneuver without evidence of ataxia, dysmetria or tremor. Gait and station: Patient walks without assistive device . No instability .   Assessment:  After  physical and neurologic examination, review of laboratory studies, imaging,  neurophysiology testing and pre-existing records,    Labs : Creatinine elevated for 20 years, eye exam normal.   1) assessment is that of Narcolepsy , without cataplexy.  2)Sensory changes in the right arm . Related to position in bed, cannot sleep with extended arm on the right. Ulnar neuropathy.  3) vertigo and headaches, visual aura for migraine.  Responding to triptan and ibuprofen. Vertigo is part of his migraine.    Plan:  Treatment plan and additional workup :  Continue Xyrem at 2.5  gram twice nightly.   Swiss narcolepsy scale at 12 minus.  He still wakes up 2-3  times each night on XYREM 2 gram bid. First wakes up after 1.5 hours of sleep, again after 3 hours of sleep.  He  used Quarry manager on the long flight to [place] , for the first time he slept on a plane !    Milferd Ansell, MD

## 2015-10-27 ENCOUNTER — Telehealth: Payer: Self-pay

## 2015-10-27 NOTE — Telephone Encounter (Signed)
I received a notice from Medicare stating that Medicare will not continue to cover th prescription's full amount because of a quantity limit of 18 tablets/30 days. I called [facility] and verified that they only dispense 10 tablets per 30 days as ordered. They verified that the last refill was on 10/17/15 for 10 tablets and they will only give 10 tablets per 30 days.

## 2015-12-18 ENCOUNTER — Telehealth: Payer: Self-pay

## 2015-12-18 NOTE — Telephone Encounter (Signed)
Completed pa for xyrem and submitted to [facility]. Should received determination in 1-3 business days.

## 2015-12-18 NOTE — Telephone Encounter (Signed)
PA for xyrem was approved 12/18/15 to 12/17/16.

## 2015-12-23 ENCOUNTER — Other Ambulatory Visit: Payer: Self-pay

## 2015-12-23 DIAGNOSIS — G47419 Narcolepsy without cataplexy: Secondary | ICD-10-CM

## 2015-12-23 DIAGNOSIS — S43421D Sprain of right rotator cuff capsule, subsequent encounter: Secondary | ICD-10-CM

## 2015-12-23 DIAGNOSIS — G47411 Narcolepsy with cataplexy: Secondary | ICD-10-CM

## 2015-12-23 DIAGNOSIS — G43109 Migraine with aura, not intractable, without status migrainosus: Secondary | ICD-10-CM

## 2015-12-23 MED ORDER — XYREM 500 MG/ML PO SOLN: ORAL | Status: DC

## 2015-12-23 MED ORDER — XYREM 500 MG/ML PO SOLN
ORAL | Status: DC
Start: 2015-12-23 — End: 2016-02-26

## 2015-12-23 NOTE — Telephone Encounter (Signed)
RX faxed to xyrem [facility] program. Received a receipt of confirmation.

## 2016-01-29 ENCOUNTER — Other Ambulatory Visit: Payer: Self-pay | Admitting: Neurology

## 2016-02-26 ENCOUNTER — Ambulatory Visit (INDEPENDENT_AMBULATORY_CARE_PROVIDER_SITE_OTHER): Payer: Medicare Other | Admitting: Neurology

## 2016-02-26 ENCOUNTER — Encounter: Payer: Self-pay | Admitting: Neurology

## 2016-02-26 VITALS — BP 114/78 | HR 68 | Resp 20 | Ht 66.0 in | Wt 164.0 lb

## 2016-02-26 DIAGNOSIS — S43421D Sprain of right rotator cuff capsule, subsequent encounter: Secondary | ICD-10-CM

## 2016-02-26 DIAGNOSIS — G43109 Migraine with aura, not intractable, without status migrainosus: Secondary | ICD-10-CM

## 2016-02-26 DIAGNOSIS — G47419 Narcolepsy without cataplexy: Secondary | ICD-10-CM | POA: Diagnosis not present

## 2016-02-26 DIAGNOSIS — G47411 Narcolepsy with cataplexy: Secondary | ICD-10-CM

## 2016-02-26 MED ORDER — XYREM 500 MG/ML PO SOLN
ORAL | Status: DC
Start: 2016-02-26 — End: 2016-07-27

## 2016-02-26 NOTE — Progress Notes (Signed)
[name]  Provider:  [name], M D  Referring Provider: [name], MD Primary Care Physician:  Henrine Screws, MD  Chief Complaint  Patient presents with  . 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 There were also no clinically significant oxygen desaturations and only moderate snoring was noted- not continuously. The patient stayed on for an MSLT the following day, which revealed a mean sleep latency of only 4.4 minutes also no REM sleep was noted. Based on the persistent hypersomnia complaint and Epworth Sleepiness Scale of 14 points a normal body mass index and normal hypertension or diabetes been present at that time. The diagnosis was clinically without of severe or excessive daytime hypersomnia clinically in the narcolepsy spectrum. The patient had induce sleep paralysis, hypersomnia, sleep attacks, vivid dreams and  intrusions in wakefulness and some severe sleep fragmentation was also reported. He reported.hypnapompic hallucinations.  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 love. He woke one morning with a severest of headaches stiffness and photophobia. In the meantime he has been started on Valtrex and his migraines have responded, he feels more alert to overall it seems that the Valtrex has not just reduced possible hepatic lesions but had a positive cognitive and alertness affect. His energy level is much higher than it was before Valtrex was introduced.  XYREM had been introduced- and the patient reported a reduction in the Epworth sleepiness score from 14-4 points and today to 6 points.  The regular dose of Xyrem, that was initiated caused some side  effects. Based on this, [name] is now taking a rather low dose of Xyrem at night, 2 gram bid and  still achieves a reduction in his sleepiness but has no sexual side effects. He stated that he experiences some migraines, announced by visual aura of the central scotoma now 2-3 times a  month ,but only leading to a headache every 2 month once . He sees "squiggly lines" for about 2 hours, in that case he will take 50 mg Imitrex and Aleve. He still gets vertigo. Recently he met a childhood friend who told him : " you were always asleep ;anytime and anywhere " .  Sleep habits unchanged:  XYREM : 10 PM bedtime , promptly going to sleep  and up at 5 AM.  No longer vivid dreams. He is not waking up with hallucinations 10 times at night. Hypnapompic and hypnagogic hallucinations are reduced.  He has a new complaint of left arm numbness when keeping the arm extended for a time, for ex. while biking.  Neck arthritis.   Interval history : 08-29-14 " if I take more Xyrem I sleep better, but have erectile dysfunction. If I don't take enough, my libido is fine but I an prone to vertigo".  He developed a way to take xyrem at 11 Pm and goes to bed at 9 Pm, the second at 1.30 AM- no hang over. His daughter was married October, in an interfaith marriage. He is doing well. No alcohol.   Interval 02-27-15 -Patient is planning a trip to [place] and had questions about how to get rest on the pain especially in light of him being  a Xyrem user. I encouraged him to use Sonata,  He would not be a candidate for bit summer. His Epworth sleepiness score today is endorsed at 7 points his geriatric depression score was 1. I will also fill the paperwork necessary to enroll the patient in the so-called REMS program. 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 He is not falling asleep in the car, plane etc. Today repeated labs. LFT.   Interval history from 12-15-16Patient doing well on 2.5 gram  twice nightly of XYREM. Did well with Sonata on his flight to [place], was  finally able to sleep on a plane. He has been diagnosed with a herpetic infection on his right thumb , He was placed on Valtrex. Had taken this 4 years ago. He has noted that the valtrex keeps trigeminal pain and Vertigo away !! [name] also provides his last laboratory tests from Dr. Inda Merlin primary care office for me. Labs were normal.  I also found the recent metabolic panel obtained on 02-27-15. His labs show normal liver function tests but an elevated creatinine of 1.48. Ago his creatinine was 1.3 the patient is a elevated creatinine levels. 20 years.  Interval history from 02/26/2016. As the pleasure of seeing [name] today who is here for his routine follow-up is a Xyrem patient. He also reports that he has had an increase in alertness, vitality and cognitive ability since he was started on Valtrex. He has a very remote history of a viral meningitis in 1988. Gean Quint diagnosed a herpetic whitlow on his thumb in July 2016, and asked him to restart Valtrex. His skin lesions have resolved, he also reported having had some fasciculations in his gastrocnemius muscle which have reduced and he does know longer suffer from migraines. And he doesn't have the trigeminal pain on his right face.     Review of Systems: Out of a complete 14 system review, the patient complains of only the following symptoms, and all other reviewed systems are negative. epworth  7 points. FSS 13 points . GDS 1 .  Never able to sleep on planes or in cars.  Sonata taken when on long plane trips.  Herpetic cause of narcolepsy - sleepiness. ?   Social History   Social History  . Marital Status: Married    Spouse Name: [name]  . Number of Children: 2  . Years of Education: College   Occupational History  .     Social History Main Topics  . Smoking status: Never Smoker   . Smokeless tobacco: Never Used  . Alcohol Use: 4.2 oz/week    7  Cans of beer per week     Comment: consumes one beer daily  . Drug Use: No  . Sexual Activity: Not on file   Other Topics Concern  . Not on file   Social History Narrative   Patient is married [name]) and lives at home with his wife.   Patient has two adult children.   Patient is working full-time.   Patient has a Financial risk analyst, Production manager.   Patient is right-handed.   Patient drinks two cups of coffee in the morning.    Family History  Problem Relation Age of Onset  . Heart disease Father 27  . Hypertension Father   . Cancer Mother     breast,colon,skin  . Heart disease Mother     prostate  . CVA Maternal Grandmother   . Microcephaly Maternal Grandmother   .  Microcephaly Paternal Grandfather       Allergies as of 02/26/2016 - Review Complete 02/26/2016  Allergen Reaction Noted  . Azithromycin Photosensitivity 02/26/2016  . Tetracyclines & related  06/07/2011  . Doxycycline  04/12/2013  . Dulcolax stool softener [dss]  04/12/2013    Vitals: BP 114/78 mmHg  Pulse 68  Resp 20  Ht '5\' 6"'$  (1.676 m)  Wt 164 lb (74.39 kg)  BMI 26.48 kg/m2 Last Weight:  Wt Readings from Last 1 Encounters:  02/26/16 164 lb (74.39 kg)   Last Height:   Ht Readings from Last 1 Encounters:  02/26/16 '5\' 6"'$  (1.676 m)    Physical exam:  General: The patient is awake, alert and appears not in acute distress. The patient is well groomed. Head: Normocephalic, atraumatic. Neck is supple.  Cardiovascular:  Regular rate and rhythm , without  murmurs or carotid bruit, and without distended neck veins. Respiratory: Lungs are clear to auscultation. Skin:  Without evidence of edema, or rash Trunk:  Patient  has normal posture.  Neurologic exam : The patient is awake and alert, oriented to place and time.   Memory subjective described as intact. There is a normal attention span & concentration ability.  Speech is fluent without dysarthria, dysphonia or aphasia.  Mood and affect are  appropriate. He appears happy, light and fresh.  Cranial nerves: Pupils are equal and briskly reactive to light.  Extraocular movements  in vertical and horizontal planes intact and without nystagmus. Visual fields by finger perimetry are intact. Hearing to finger rub intact.  Facial sensation intact to fine touch. Facial motor strength is symmetric and tongue and uvula move midline.   Assessment:  After physical and neurologic examination, review of laboratory studies, imaging, neurophysiology testing and pre-existing records,    Labs : Creatinine elevated for 20 years, eye exam normal.   1) assessment is that of Narcolepsy , without cataplexy.  2)Sensory changes in the right arm . Related to position in bed, cannot sleep with extended arm on the right. Ulnar neuropathy. All improved after Valtrex .   3) vertigo and headaches, visual aura for migraine. Responding to triptan and ibuprofen. Vertigo is part of his migraine.    Plan:  Treatment plan and additional workup :   Continue Xyrem at 2.5  gram twice nightly.   Swiss narcolepsy scale at 12 minus.  He still wakes up 2-3  times each night on XYREM 2 gram bid. First wakes up after 1.5 hours of sleep, again after 3 hours of sleep.  He used Quarry manager on  long flights  , for the first time he slept on a plane ! Has this medication prn available.    Azrael Maddix, [place]. MD

## 2016-02-27 ENCOUNTER — Other Ambulatory Visit: Payer: Self-pay | Admitting: General Surgery

## 2016-02-27 DIAGNOSIS — D216 Benign neoplasm of connective and other soft tissue of trunk, unspecified: Secondary | ICD-10-CM | POA: Diagnosis not present

## 2016-02-27 DIAGNOSIS — D170 Benign lipomatous neoplasm of skin and subcutaneous tissue of head, face and neck: Secondary | ICD-10-CM | POA: Diagnosis not present

## 2016-02-27 LAB — COMPREHENSIVE METABOLIC PANEL
A/G RATIO: 1.8 (ref 1.2–2.2)
ALT: 23 IU/L (ref 0–44)
AST: 29 IU/L (ref 0–40)
Albumin: 4.3 g/dL (ref 3.6–4.8)
Alkaline Phosphatase: 40 IU/L (ref 39–117)
BILIRUBIN TOTAL: 0.6 mg/dL (ref 0.0–1.2)
BUN / CREAT RATIO: 9 — AB (ref 10–24)
BUN: 13 mg/dL (ref 8–27)
CO2: 24 mmol/L (ref 18–29)
Calcium: 9.6 mg/dL (ref 8.6–10.2)
Chloride: 101 mmol/L (ref 96–106)
Creatinine, Ser: 1.37 mg/dL — ABNORMAL HIGH (ref 0.76–1.27)
GFR calc non Af Amer: 53 mL/min/{1.73_m2} — ABNORMAL LOW (ref >59)
GFR, EST AFRICAN AMERICAN: 61 mL/min/{1.73_m2} (ref >59)
GLOBULIN, TOTAL: 2.4 g/dL (ref 1.5–4.5)
Glucose: 90 mg/dL (ref 65–99)
Potassium: 4.7 mmol/L (ref 3.5–5.2)
SODIUM: 141 mmol/L (ref 134–144)
TOTAL PROTEIN: 6.7 g/dL (ref 6.0–8.5)

## 2016-03-01 ENCOUNTER — Telehealth: Payer: Self-pay

## 2016-03-01 NOTE — Telephone Encounter (Signed)
I spoke to pt and advised him that per Dr. Brett Fairy, his labs are all as usual, chronically elevated creatinine, normal LFTs. Pt verbalized understanding of results. Pt had no questions at this time but was encouraged to call back if questions arise.  Pt wanted to advise Dr. Brett Fairy that he had a lipoma removed from his scalp yesterday.

## 2016-03-01 NOTE — Telephone Encounter (Signed)
I called pt to discuss labs. No answer, left a message asking him to call me back. 

## 2016-03-01 NOTE — Telephone Encounter (Signed)
-----   Message from [name], MD sent at 02/27/2016  1:49 PM EDT ----- All as usual;  chronically elevated creatinine, Normal LFTs.  The XYREM and Valtrex medication have not caused liver problems.

## 2016-03-31 DIAGNOSIS — M25571 Pain in right ankle and joints of right foot: Secondary | ICD-10-CM | POA: Diagnosis not present

## 2016-03-31 DIAGNOSIS — M79604 Pain in right leg: Secondary | ICD-10-CM | POA: Diagnosis not present

## 2016-03-31 DIAGNOSIS — M25371 Other instability, right ankle: Secondary | ICD-10-CM | POA: Diagnosis not present

## 2016-03-31 DIAGNOSIS — M79671 Pain in right foot: Secondary | ICD-10-CM | POA: Diagnosis not present

## 2016-03-31 DIAGNOSIS — M19079 Primary osteoarthritis, unspecified ankle and foot: Secondary | ICD-10-CM | POA: Diagnosis not present

## 2016-05-07 DIAGNOSIS — L820 Inflamed seborrheic keratosis: Secondary | ICD-10-CM | POA: Diagnosis not present

## 2016-05-07 DIAGNOSIS — L570 Actinic keratosis: Secondary | ICD-10-CM | POA: Diagnosis not present

## 2016-05-07 DIAGNOSIS — L821 Other seborrheic keratosis: Secondary | ICD-10-CM | POA: Diagnosis not present

## 2016-05-07 DIAGNOSIS — L814 Other melanin hyperpigmentation: Secondary | ICD-10-CM | POA: Diagnosis not present

## 2016-05-07 DIAGNOSIS — L918 Other hypertrophic disorders of the skin: Secondary | ICD-10-CM | POA: Diagnosis not present

## 2016-05-07 DIAGNOSIS — D225 Melanocytic nevi of trunk: Secondary | ICD-10-CM | POA: Diagnosis not present

## 2016-07-22 ENCOUNTER — Encounter: Payer: Self-pay | Admitting: Neurology

## 2016-07-27 ENCOUNTER — Other Ambulatory Visit: Payer: Self-pay

## 2016-07-27 DIAGNOSIS — G47411 Narcolepsy with cataplexy: Secondary | ICD-10-CM

## 2016-07-27 DIAGNOSIS — G47419 Narcolepsy without cataplexy: Secondary | ICD-10-CM

## 2016-07-27 DIAGNOSIS — G43109 Migraine with aura, not intractable, without status migrainosus: Secondary | ICD-10-CM

## 2016-07-27 MED ORDER — XYREM 500 MG/ML PO SOLN
ORAL | 5 refills | Status: DC
Start: 2016-07-27 — End: 2017-02-10

## 2016-07-27 NOTE — Telephone Encounter (Signed)
RX for xyrem faxed to [facility]. Received a receipt of confirmation.

## 2016-08-16 DIAGNOSIS — Z79899 Other long term (current) drug therapy: Secondary | ICD-10-CM | POA: Diagnosis not present

## 2016-08-16 DIAGNOSIS — G43109 Migraine with aura, not intractable, without status migrainosus: Secondary | ICD-10-CM | POA: Diagnosis not present

## 2016-08-16 DIAGNOSIS — G47429 Narcolepsy in conditions classified elsewhere without cataplexy: Secondary | ICD-10-CM | POA: Diagnosis not present

## 2016-08-16 DIAGNOSIS — E785 Hyperlipidemia, unspecified: Secondary | ICD-10-CM | POA: Diagnosis not present

## 2016-08-16 DIAGNOSIS — Z23 Encounter for immunization: Secondary | ICD-10-CM | POA: Diagnosis not present

## 2016-08-16 DIAGNOSIS — Z1389 Encounter for screening for other disorder: Secondary | ICD-10-CM | POA: Diagnosis not present

## 2016-08-16 DIAGNOSIS — D126 Benign neoplasm of colon, unspecified: Secondary | ICD-10-CM | POA: Diagnosis not present

## 2016-08-16 DIAGNOSIS — G47419 Narcolepsy without cataplexy: Secondary | ICD-10-CM | POA: Diagnosis not present

## 2016-08-16 DIAGNOSIS — K219 Gastro-esophageal reflux disease without esophagitis: Secondary | ICD-10-CM | POA: Diagnosis not present

## 2016-08-16 DIAGNOSIS — Z125 Encounter for screening for malignant neoplasm of prostate: Secondary | ICD-10-CM | POA: Diagnosis not present

## 2016-08-16 DIAGNOSIS — Z0000 Encounter for general adult medical examination without abnormal findings: Secondary | ICD-10-CM | POA: Diagnosis not present

## 2016-08-16 DIAGNOSIS — B009 Herpesviral infection, unspecified: Secondary | ICD-10-CM | POA: Diagnosis not present

## 2016-08-16 DIAGNOSIS — R3129 Other microscopic hematuria: Secondary | ICD-10-CM | POA: Diagnosis not present

## 2016-08-16 DIAGNOSIS — N529 Male erectile dysfunction, unspecified: Secondary | ICD-10-CM | POA: Diagnosis not present

## 2016-08-16 DIAGNOSIS — G4753 Recurrent isolated sleep paralysis: Secondary | ICD-10-CM | POA: Diagnosis not present

## 2016-09-16 ENCOUNTER — Ambulatory Visit: Payer: Medicare Other | Admitting: Neurology

## 2016-10-21 ENCOUNTER — Ambulatory Visit: Payer: Medicare Other | Admitting: Neurology

## 2016-11-25 ENCOUNTER — Ambulatory Visit: Payer: Medicare Other | Admitting: Neurology

## 2017-02-10 ENCOUNTER — Ambulatory Visit (INDEPENDENT_AMBULATORY_CARE_PROVIDER_SITE_OTHER): Payer: Medicare Other | Admitting: Neurology

## 2017-02-10 ENCOUNTER — Encounter: Payer: Self-pay | Admitting: Neurology

## 2017-02-10 VITALS — BP 98/62 | HR 60 | Ht 66.0 in | Wt 168.0 lb

## 2017-02-10 DIAGNOSIS — G47411 Narcolepsy with cataplexy: Secondary | ICD-10-CM | POA: Diagnosis not present

## 2017-02-10 DIAGNOSIS — G47419 Narcolepsy without cataplexy: Secondary | ICD-10-CM | POA: Diagnosis not present

## 2017-02-10 DIAGNOSIS — G43109 Migraine with aura, not intractable, without status migrainosus: Secondary | ICD-10-CM | POA: Diagnosis not present

## 2017-02-10 DIAGNOSIS — G4725 Circadian rhythm sleep disorder, jet lag type: Secondary | ICD-10-CM | POA: Diagnosis not present

## 2017-02-10 DIAGNOSIS — R253 Fasciculation: Secondary | ICD-10-CM | POA: Diagnosis not present

## 2017-02-10 DIAGNOSIS — H524 Presbyopia: Secondary | ICD-10-CM | POA: Diagnosis not present

## 2017-02-10 DIAGNOSIS — H2513 Age-related nuclear cataract, bilateral: Secondary | ICD-10-CM | POA: Diagnosis not present

## 2017-02-10 MED ORDER — ZALEPLON 10 MG PO CAPS: 10.0000 mg | ORAL_CAPSULE | Freq: Every evening | ORAL | 1 refills | Status: DC | PRN

## 2017-02-10 MED ORDER — XYREM 500 MG/ML PO SOLN: ORAL | 5 refills | Status: DC

## 2017-02-10 NOTE — Addendum Note (Signed)
Addended by: [name] on: 02/10/2017 04:27 PM   Modules accepted: Orders

## 2017-02-10 NOTE — Progress Notes (Signed)
[name]  Provider:  [name], M D  Referring Provider: [name], MD Primary Care Physician:  [name], MD  Chief Complaint  Patient presents with  . 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 Xyrem has helped to achieve a higher quality of sleep, less fragmented sleep. But the lower dose has not allowed him to skip a dose without feeling sleepy again. The patient also had noticed a decrease in headache frequency after he achieves better sleep. Since he has been treated with Valtrex for herpetic whitlow he noted further decrease in headache frequency and intensity and overall at the level of energy is well he feels cognitively brighter and he has not experienced vertigo in a while He  continues to use valtrex, has had a herpetic finger infection .     Dr. [name] underwent a polysomnography on 11/26/2011, revealing only mild sleep disordered breathing. There were also no clinically significant oxygen desaturations and only moderate snoring was noted- not continuously. The patient stayed on for an MSLT the following day, which revealed a mean sleep latency of only 4.4 minutes also no REM sleep was noted. Based on the persistent hypersomnia complaint and Epworth Sleepiness Scale of 14 points a normal body mass index and normal hypertension or diabetes been present at that time. The diagnosis was clinically without of severe or excessive daytime hypersomnia clinically in the narcolepsy spectrum. The patient had induce sleep paralysis, hypersomnia, sleep attacks, vivid dreams and  intrusions in wakefulness and some severe sleep fragmentation was also reported. He reported.hypnapompic hallucinations.  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 love. He woke one morning with a severest of headaches stiffness and photophobia. In the meantime he has been started on Valtrex and his migraines have responded, he feels more alert to overall it seems that the Valtrex has not just reduced possible hepatic lesions but had a positive cognitive and alertness affect. His energy level is much higher than it was before Valtrex was introduced.  XYREM had been introduced- and the patient reported a reduction in the Epworth sleepiness score from 14-4 points and today to 6 points.  The regular dose of Xyrem, that was initiated caused some side effects. Based on this, Dr. [name] is now taking a rather low dose of Xyrem at night, 2 gram bid and  still achieves a reduction in his sleepiness but has no sexual side effects. He stated that he experiences some migraines, announced by visual aura of the central scotoma now 2-3 times a  month ,but only leading to a headache every 2 month once . He sees "squiggly lines" for about 2 hours, in that case he will take 50 mg Imitrex and Aleve. He still gets vertigo. Recently he met a childhood friend who told him : " you were always asleep ;anytime and anywhere " .  Sleep habits unchanged:  XYREM : 10 PM bedtime , promptly going to sleep  and up at 5 AM.  No longer vivid dreams. He is not waking up with hallucinations 10 times at night. Hypnapompic and hypnagogic hallucinations are reduced.  He has a new complaint  of left arm numbness when keeping the arm extended for a time, for ex. while biking.  Neck arthritis.     Review of Systems: Out of a complete 14 system review, the patient complains of only the following symptoms, and all other reviewed systems are negative. epworth  7 points. FSS 13 points . GDS 1 .  Never able to sleep on planes or in cars.  Sonata taken when on long plane trips.  Herpetic cause of narcolepsy - sleepiness. ?   Social History    Social History  . Marital status: Married    Spouse name: Christopher Weber  . Number of children: 2  . Years of education: College   Occupational History  .  [employer]   Social History Main Topics  . Smoking status: Never Smoker  . Smokeless tobacco: Never Used  . Alcohol use 4.2 oz/week    7 Cans of beer per week     Comment: consumes one beer daily  . Drug use: No  . Sexual activity: Not on file   Other Topics Concern  . Not on file   Social History Narrative   Patient is married Christopher Weber) and lives at home with his wife.   Patient has two adult children.   Patient is working full-time.   Patient has a Financial risk analyst, Production manager.   Patient is right-handed.   Patient drinks two cups of coffee in the morning.    Family History  Problem Relation Age of Onset  . Heart disease Father 61  . Hypertension Father   . Cancer Mother        breast,colon,skin  . Heart disease Mother        prostate  . CVA Maternal Grandmother   . Microcephaly Maternal Grandmother   . Microcephaly Paternal Grandfather       Allergies as of 02/10/2017 - Review Complete 02/10/2017  Allergen Reaction Noted  . Azithromycin Photosensitivity 02/26/2016  . Tetracyclines & related  06/07/2011  . Doxycycline Photosensitivity 04/12/2013  . Dulcolax stool softener [dss] Nausea And Vomiting 04/12/2013    Vitals: BP 98/62   Pulse 60   Ht _0  (1.676 m)   Wt 168 lb (76.2 kg)   BMI 27.12 kg/m  Last Weight:  Wt Readings from Last 1 Encounters:  02/10/17 168 lb (76.2 kg)   Last Height:   Ht Readings from Last 1 Encounters:  02/10/17 _1  (1.676 m)    Physical exam:  General: The patient is awake, alert and appears not in acute distress. The patient is well groomed. Head: Normocephalic, atraumatic. Neck is supple.  Cardiovascular:  Regular rate and rhythm , without  murmurs or carotid bruit, and without distended neck veins. Respiratory: Lungs are clear to auscultation. Skin:   Without evidence of edema, or rash Trunk:  Patient  has normal posture.  Neurologic exam : The patient is oriented to place and time.   He is alert .  Memory subjective described as intact. There is a normal attention span & concentration ability.  Speech is fluent without dysarthria, dysphonia or aphasia.  Mood and affect are appropriate. He appears happy, light and fresh.  Cranial nerves: Pupils are equal and briskly reactive to light.  Extraocular movements  in vertical and horizontal planes intact and without nystagmus. Visual fields by finger perimetry are intact. Hearing to finger rub intact.  Facial sensation intact to fine touch. No vertigo with sudden headmovements. Facial motor strength is symmetric and tongue and uvula move  midline.   Assessment:  After physical and neurologic examination, review of laboratory studies, imaging, neurophysiology testing and pre-existing records,    Labs : Creatinine elevated for 20 years, eye exam normal. Will continue with current treatment   1)  Narcolepsy , without cataplexy. On XYREM and Valtrex.  vertigo and headaches improved.  2)  Benign fasciculations for over 20 years. Unchanged.  3) new job has him travel all the time. Jet lag not a problem on [place], will  not take XYREM on travels of 1-2 day duration.   Plan:  Treatment plan and additional workup :   Continue Xyrem at 2.5  gram twice nightly.   Swiss narcolepsy scale at 12 minus.   [name], MD Cc Geanie Logan. MD

## 2017-02-11 LAB — COMPREHENSIVE METABOLIC PANEL
ALK PHOS: 40 IU/L (ref 39–117)
ALT: 25 IU/L (ref 0–44)
AST: 32 IU/L (ref 0–40)
Albumin/Globulin Ratio: 2 (ref 1.2–2.2)
Albumin: 4.7 g/dL (ref 3.6–4.8)
BUN/Creatinine Ratio: 10 (ref 10–24)
BUN: 14 mg/dL (ref 8–27)
Bilirubin Total: 0.7 mg/dL (ref 0.0–1.2)
CALCIUM: 10 mg/dL (ref 8.6–10.2)
CO2: 24 mmol/L (ref 18–29)
Chloride: 101 mmol/L (ref 96–106)
Creatinine, Ser: 1.41 mg/dL — ABNORMAL HIGH (ref 0.76–1.27)
GFR calc Af Amer: 59 mL/min/{1.73_m2} — ABNORMAL LOW (ref >59)
GFR, EST NON AFRICAN AMERICAN: 51 mL/min/{1.73_m2} — AB (ref >59)
GLOBULIN, TOTAL: 2.3 g/dL (ref 1.5–4.5)
GLUCOSE: 84 mg/dL (ref 65–99)
Potassium: 5.1 mmol/L (ref 3.5–5.2)
SODIUM: 141 mmol/L (ref 134–144)
Total Protein: 7 g/dL (ref 6.0–8.5)

## 2017-02-14 ENCOUNTER — Telehealth: Payer: Self-pay | Admitting: *Deleted

## 2017-02-14 NOTE — Telephone Encounter (Signed)
Called and spoke with patient about lab results per CD,MD note. Pt verbalized understanding and appreciation for call. He stated he already saw results on mychart.

## 2017-02-14 NOTE — Telephone Encounter (Signed)
LVM for patient to call about results.  

## 2017-02-14 NOTE — Telephone Encounter (Signed)
-----   Message from [name], MD sent at 02/13/2017  1:14 PM EDT ----- Dr, [name] has chronically elevated creatinine. No change noted.   Normal liver transaminases. Continue with Xyrem. CD

## 2017-02-14 NOTE — Telephone Encounter (Signed)
Patient is returning your call.  

## 2017-02-16 ENCOUNTER — Telehealth: Payer: Self-pay

## 2017-02-16 NOTE — Telephone Encounter (Signed)
Christopher Weber with [employer] says XYREM 500 MG/ML SOLN has been approved.

## 2017-02-16 NOTE — Telephone Encounter (Signed)
I completed the pa for xyrem, sent to [facility]. Should have a determination in 1-3 business days.

## 2017-03-04 ENCOUNTER — Other Ambulatory Visit: Payer: Self-pay | Admitting: Neurology

## 2017-03-29 DIAGNOSIS — M19071 Primary osteoarthritis, right ankle and foot: Secondary | ICD-10-CM | POA: Diagnosis not present

## 2017-03-29 DIAGNOSIS — M19079 Primary osteoarthritis, unspecified ankle and foot: Secondary | ICD-10-CM | POA: Diagnosis not present

## 2017-03-29 DIAGNOSIS — M25371 Other instability, right ankle: Secondary | ICD-10-CM | POA: Diagnosis not present

## 2017-03-29 DIAGNOSIS — M19171 Post-traumatic osteoarthritis, right ankle and foot: Secondary | ICD-10-CM | POA: Diagnosis not present

## 2017-05-27 DIAGNOSIS — D692 Other nonthrombocytopenic purpura: Secondary | ICD-10-CM | POA: Diagnosis not present

## 2017-05-27 DIAGNOSIS — D2261 Melanocytic nevi of right upper limb, including shoulder: Secondary | ICD-10-CM | POA: Diagnosis not present

## 2017-05-27 DIAGNOSIS — D2272 Melanocytic nevi of left lower limb, including hip: Secondary | ICD-10-CM | POA: Diagnosis not present

## 2017-05-27 DIAGNOSIS — D225 Melanocytic nevi of trunk: Secondary | ICD-10-CM | POA: Diagnosis not present

## 2017-05-27 DIAGNOSIS — D2262 Melanocytic nevi of left upper limb, including shoulder: Secondary | ICD-10-CM | POA: Diagnosis not present

## 2017-05-27 DIAGNOSIS — L821 Other seborrheic keratosis: Secondary | ICD-10-CM | POA: Diagnosis not present

## 2017-05-27 DIAGNOSIS — D1801 Hemangioma of skin and subcutaneous tissue: Secondary | ICD-10-CM | POA: Diagnosis not present

## 2017-05-27 DIAGNOSIS — L814 Other melanin hyperpigmentation: Secondary | ICD-10-CM | POA: Diagnosis not present

## 2017-06-21 ENCOUNTER — Other Ambulatory Visit: Payer: Self-pay | Admitting: Neurology

## 2017-06-21 DIAGNOSIS — G47411 Narcolepsy with cataplexy: Secondary | ICD-10-CM

## 2017-06-21 DIAGNOSIS — G47419 Narcolepsy without cataplexy: Secondary | ICD-10-CM

## 2017-06-21 DIAGNOSIS — G43109 Migraine with aura, not intractable, without status migrainosus: Secondary | ICD-10-CM

## 2017-06-21 MED ORDER — XYREM 500 MG/ML PO SOLN: ORAL | 5 refills | Status: DC

## 2017-07-01 DIAGNOSIS — Z23 Encounter for immunization: Secondary | ICD-10-CM | POA: Diagnosis not present

## 2017-08-18 ENCOUNTER — Ambulatory Visit: Payer: Medicare Other | Admitting: Neurology

## 2017-08-26 DIAGNOSIS — Z79899 Other long term (current) drug therapy: Secondary | ICD-10-CM | POA: Diagnosis not present

## 2017-08-26 DIAGNOSIS — R3129 Other microscopic hematuria: Secondary | ICD-10-CM | POA: Diagnosis not present

## 2017-08-26 DIAGNOSIS — K219 Gastro-esophageal reflux disease without esophagitis: Secondary | ICD-10-CM | POA: Diagnosis not present

## 2017-08-26 DIAGNOSIS — B009 Herpesviral infection, unspecified: Secondary | ICD-10-CM | POA: Diagnosis not present

## 2017-08-26 DIAGNOSIS — Z125 Encounter for screening for malignant neoplasm of prostate: Secondary | ICD-10-CM | POA: Diagnosis not present

## 2017-08-26 DIAGNOSIS — G47429 Narcolepsy in conditions classified elsewhere without cataplexy: Secondary | ICD-10-CM | POA: Diagnosis not present

## 2017-08-26 DIAGNOSIS — Z0000 Encounter for general adult medical examination without abnormal findings: Secondary | ICD-10-CM | POA: Diagnosis not present

## 2017-08-26 DIAGNOSIS — G43109 Migraine with aura, not intractable, without status migrainosus: Secondary | ICD-10-CM | POA: Diagnosis not present

## 2017-08-26 DIAGNOSIS — N529 Male erectile dysfunction, unspecified: Secondary | ICD-10-CM | POA: Diagnosis not present

## 2017-08-26 DIAGNOSIS — E785 Hyperlipidemia, unspecified: Secondary | ICD-10-CM | POA: Diagnosis not present

## 2017-08-26 DIAGNOSIS — D126 Benign neoplasm of colon, unspecified: Secondary | ICD-10-CM | POA: Diagnosis not present

## 2017-08-26 DIAGNOSIS — G4753 Recurrent isolated sleep paralysis: Secondary | ICD-10-CM | POA: Diagnosis not present

## 2017-09-02 ENCOUNTER — Ambulatory Visit (INDEPENDENT_AMBULATORY_CARE_PROVIDER_SITE_OTHER): Payer: Medicare Other | Admitting: Neurology

## 2017-09-02 ENCOUNTER — Encounter: Payer: Self-pay | Admitting: Neurology

## 2017-09-02 DIAGNOSIS — G47411 Narcolepsy with cataplexy: Secondary | ICD-10-CM | POA: Diagnosis not present

## 2017-09-02 DIAGNOSIS — G47419 Narcolepsy without cataplexy: Secondary | ICD-10-CM | POA: Diagnosis not present

## 2017-09-02 DIAGNOSIS — G43109 Migraine with aura, not intractable, without status migrainosus: Secondary | ICD-10-CM | POA: Diagnosis not present

## 2017-09-02 MED ORDER — XYREM 500 MG/ML PO SOLN
ORAL | 5 refills | Status: DC
Start: 2017-09-02 — End: 2018-02-21

## 2017-09-02 MED ORDER — ZALEPLON 10 MG PO CAPS: 10.0000 mg | ORAL_CAPSULE | Freq: Every evening | ORAL | 1 refills | Status: DC | PRN

## 2017-09-02 NOTE — Progress Notes (Deleted)
9

## 2017-09-02 NOTE — Patient Instructions (Signed)
As discussed, Xyrem has to be taken with very mindful caution: Taking Xyrem correctly is key. This means, take it only when you are fully ready to fall asleep, while in bed and refrain from doing any other activities, even brushing  your teeth after taking your first dose. The second dose will be about 2-1/2-4 hours after his first dose. You can go to the bathroom before your 2nd dose. Take your first dose, when actually IN BED, ready to sleep. No sitting up in bed, NO reading, NO using the cell phone or computer, NO getting up to use the bathroom. Take care of everything BEFORE sleep time. Try NOT to skip the second dose as the Xyrem is not going to stay in your system long enough with only one dose. Do not drink alcohol with Xyrem. If you do drink Alcohol, you cannot take your Xyrem doses that night.   

## 2017-09-02 NOTE — Progress Notes (Signed)
[name]  Provider:  [name], M D  Referring Provider: [name], MD Primary Care Physician:  [name], MD  Chief Complaint  Patient presents with  . Follow-up    pt alone, rm 10. 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  I have the pleasure of seeing Christopher Weber today on 02 September 2017 ina routine revisit to refill his XYREM.  He had recent labs drawn with his primary care physician on 14 December, [name], and except for an elevated LDL and HDL-transaminases are normal, TSH was normal range, MPV is low at 6.7 ALP was low at 35, creatinine has always been elevated and remains at 1.4 with an estimated glomerular filtration rate of 50.    02/10/2017 Dr [name] is doing well, He continues to take 2 doses of Xyrem at nighttime but at a low dose. Xyrem has helped to achieve a higher quality of sleep, less fragmented sleep. But the lower dose has not allowed him to skip a dose without feeling sleepy again. The patient also had noticed a decrease in headache frequency after he achieves better sleep. Since he has been treated with Valtrex for herpetic whitlow he noted further decrease in headache frequency and intensity and overall at the level of energy is well he feels cognitively brighter and he has not experienced vertigo in a while He  continues to use valtrex, has had a herpetic finger infection .     Christopher Weber underwent a polysomnography on 11/26/2011, revealing only mild sleep disordered breathing. There were also no clinically significant oxygen desaturations and only moderate snoring was noted- not continuously. The patient stayed on for an MSLT the following day, which revealed a mean sleep latency of only 4.4 minutes also no REM sleep was noted. Based on the persistent hypersomnia complaint and  Epworth Sleepiness Scale of 14 points a normal body mass index and normal hypertension or diabetes been present at that time. The diagnosis was clinically without of severe or excessive daytime hypersomnia clinically in the narcolepsy spectrum. The patient had induce sleep paralysis, hypersomnia, sleep attacks, vivid dreams and  intrusions in wakefulness and some severe sleep fragmentation was also reported. He reported.hypnapompic hallucinations.  Christopher Weber suffered a bout of viral meningitis in 1988, and was treated at the time by Dr. love. He woke one morning with a severest of headaches stiffness and photophobia. In the meantime he has been started on Valtrex and his migraines have responded, he feels more alert to overall it seems that the Valtrex has not just reduced possible hepatic lesions but had a positive cognitive and alertness affect. His energy level is much higher than it was before Valtrex was introduced.  XYREM had been introduced- and the patient reported a reduction in the Epworth sleepiness score from 14-4 points and today to 6 points.  The regular dose of Xyrem, that was initiated caused some side effects. Based on this, Christopher Weber is now taking a rather low dose of Xyrem at night, 2 gram bid and  still achieves a reduction in his sleepiness but has no sexual side effects. He stated that he experiences some migraines, announced by visual aura of the central scotoma now 2-3 times a  month ,but only leading to a headache every 2 month once . He sees "  squiggly lines" for about 2 hours, in that case he will take 50 mg Imitrex and Aleve. He still gets vertigo. Recently he met a childhood friend who told him : " you were always asleep ;anytime and anywhere " .  Sleep habits unchanged:  XYREM : 10 PM bedtime , promptly going to sleep  and up at 5 AM.  No longer vivid dreams. He is not waking up with hallucinations 10 times at night. Hypnapompic and hypnagogic hallucinations are reduced.  He has  a new complaint of left arm numbness when keeping the arm extended for a time, for ex. while biking.  Neck arthritis.     Review of Systems: Out of a complete 14 system review, the patient complains of only the following symptoms, and all other reviewed systems are negative. epworth 8 points. FSS 12 points . GDS 1 .  Never able to sleep on planes or in cars.  Sonata taken when on long plane trips.  Herpetic cause of narcolepsy - sleepiness. ?   Social History   Socioeconomic History  . Marital status: Married    Spouse name: Christopher Weber  . Number of children: 2  . Years of education: College  . Highest education level: Not on file  Social Needs  . Financial resource strain: Not on file  . Food insecurity - worry: Not on file  . Food insecurity - inability: Not on file  . Transportation needs - medical: Not on file  . Transportation needs - non-medical: Not on file  Occupational History    Employer: [employer]  Tobacco Use  . Smoking status: Never Smoker  . Smokeless tobacco: Never Used  Substance and Sexual Activity  . Alcohol use: Yes    Alcohol/week: 4.2 oz    Types: 7 Cans of beer per week    Comment: consumes one beer daily  . Drug use: No  . Sexual activity: Not on file  Other Topics Concern  . Not on file  Social History Narrative   Patient is married Christopher Weber) and lives at home with his wife.   Patient has two adult children.   Patient is working full-time.   Patient has a Financial risk analyst, Production manager.   Patient is right-handed.   Patient drinks two cups of coffee in the morning.    Family History  Problem Relation Age of Onset  . Heart disease Father 55  . Hypertension Father   . Cancer Mother        breast,colon,skin  . Heart disease Mother        prostate  . CVA Maternal Grandmother   . Microcephaly Maternal Grandmother   . Microcephaly Paternal Grandfather       Allergies as of 09/02/2017 - Review Complete 09/02/2017  Allergen Reaction  Noted  . Azithromycin Photosensitivity 02/26/2016  . Tetracyclines & related  06/07/2011  . Doxycycline Photosensitivity 04/12/2013  . Dulcolax stool softener [dss] Nausea And Vomiting 04/12/2013    Vitals: BP 105/72   Pulse 60   Ht _0  (1.702 m)   Wt 170 lb (77.1 kg)   BMI 26.63 kg/m  Last Weight:  Wt Readings from Last 1 Encounters:  09/02/17 170 lb (77.1 kg)   Last Height:   Ht Readings from Last 1 Encounters:  09/02/17 _1  (1.702 m)    Physical exam:  General: The patient is awake, alert and appears not in acute distress. The patient is well groomed. Head: Normocephalic, atraumatic. Neck is supple.  Cardiovascular:  Regular rate and rhythm, without  murmurs or carotid bruit, and without distended neck veins. Respiratory: Lungs are clear to auscultation. Skin:  Without evidence of edema, or rash Trunk: Patient  has normal posture.  Neurologic exam :The patient is oriented to place and time.   He is alert .  Memory subjective described as intact. There is a normal attention span & concentration ability.  Speech is fluent without dysarthria, dysphonia or aphasia.  Mood and affect are appropriate. He appears happy, light and fresh.  Cranial nerves: Pupils are equal and briskly reactive to light.  Extraocular movements  in vertical and horizontal planes intact and without nystagmus. Visual fields by finger perimetry are intact. Hearing to finger rub intact.  Facial sensation intact to fine touch. No vertigo with sudden headmovements. Facial motor strength is symmetric and tongue and uvula move midline.   Assessment:  After physical and neurologic examination, review of laboratory studies, imaging, neurophysiology testing and pre-existing records,    Labs : Creatinine elevated for 20 years, eye exam normal. Will continue with current treatment   1)  Narcolepsy, without cataplexy. On XYREM and Valtrex. Vertigo and headaches improved.  2)  Benign fasciculations for over 20  years. Unchanged.  3)  Job has him travel all the time. Jet lag not a problem on [place], will  not take XYREM on travels of 1-2 day duration.   Plan:  Treatment plan and additional workup :   Continue Xyrem at 2.5  gram twice nightly.   Swiss narcolepsy scale at 12 minus.   [name], MD Cc Geanie Logan. MD

## 2017-10-13 ENCOUNTER — Ambulatory Visit: Payer: Medicare Other | Admitting: Neurology

## 2018-02-16 ENCOUNTER — Ambulatory Visit: Payer: Medicare Other | Admitting: Neurology

## 2018-02-21 ENCOUNTER — Other Ambulatory Visit: Payer: Self-pay | Admitting: Neurology

## 2018-02-21 ENCOUNTER — Telehealth: Payer: Self-pay | Admitting: Neurology

## 2018-02-21 DIAGNOSIS — G47419 Narcolepsy without cataplexy: Secondary | ICD-10-CM

## 2018-02-21 DIAGNOSIS — G47411 Narcolepsy with cataplexy: Secondary | ICD-10-CM

## 2018-02-21 DIAGNOSIS — G43109 Migraine with aura, not intractable, without status migrainosus: Secondary | ICD-10-CM

## 2018-02-21 MED ORDER — XYREM 500 MG/ML PO SOLN: ORAL | 5 refills | Status: DC

## 2018-02-21 NOTE — Telephone Encounter (Signed)
[email or web address] called to inform of the approval on the XYREM 500 MG/ML SOLN effective 02-21-2018 for 1 year.  Elta Guadeloupe can be reached at [phone] option 5.  No call back requested

## 2018-02-21 NOTE — Telephone Encounter (Signed)
PA completed through cover my meds through [employer] [place]. [id number]. Can take up to 3 days before hearing back.

## 2018-03-10 ENCOUNTER — Other Ambulatory Visit: Payer: Self-pay | Admitting: Neurology

## 2018-04-10 ENCOUNTER — Other Ambulatory Visit: Payer: Self-pay | Admitting: Neurology

## 2018-05-17 NOTE — Progress Notes (Signed)
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  He has been doing well.  He continues to take Xyrem at night sometimes he does not get in the second dose.  ESS score 7.  He has not had recent labs.  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  I have the pleasure of seeing [name] today on 02 September 2017 ina routine revisit to refill his XYREM.  He had recent labs drawn with his primary care physician on 14 December, [name], and except for an elevated LDL and HDL-transaminases are normal, TSH was normal range, MPV is low at 6.7 ALP was low at 35, creatinine has always been elevated and remains at 1.4 with an estimated glomerular filtration rate of 50.    02/10/2017 Dr [name] is doing well, He continues to take 2 doses of Xyrem at nighttime but at a low dose. Xyrem has helped to achieve a higher quality of sleep, less fragmented sleep. But the lower dose has not allowed him to skip a dose without feeling sleepy again. The patient also had noticed a decrease in headache frequency after he achieves better sleep. Since he has been treated with Valtrex for herpetic whitlow he noted further decrease in headache frequency and intensity and overall at the level of energy is well he feels cognitively brighter and he has not experienced vertigo in a while He  continues to use valtrex, has had a herpetic finger infection .     REVIEW OF SYSTEMS: Full 14 system review of systems performed and notable only for those listed, all others are neg:  Constitutional: neg    Cardiovascular: neg Ear/Nose/Throat: neg  Skin: neg Eyes: neg Respiratory: neg Gastroitestinal: neg  Hematology/Lymphatic: neg  Endocrine: neg Musculoskeletal:neg Allergy/Immunology: neg Neurological: neg Psychiatric: neg Sleep : Narcolepsy no cataplexy   ALLERGIES: Allergies  Allergen Reactions  . Azithromycin Photosensitivity  . Tetracyclines & Related     photosensitivity - can take medication but just must avoid being out in the sun   . Doxycycline Photosensitivity  . Dulcolax Stool Softener [Dss] Nausea And Vomiting    HOME MEDICATIONS: Outpatient Medications Prior to Visit  Medication Sig Dispense Refill  . Multiple Vitamin (MULTIVITAMIN) capsule Take 1 capsule by mouth daily.    . Omega-3 Fatty Acids (FISH OIL) 500 MG CAPS Take by mouth. As directed    . ranitidine (ZANTAC) 150 MG tablet Take 150 mg by mouth daily.      . SUMAtriptan (IMITREX) 100 MG tablet TAKE 1 TABLET EVERY 2 HOURS AS NEEDED FOR HEADACHE. MAY REPEAT IN 2 HRS IF HEADACHE PERSISTS 10 tablet 6  . valACYclovir (VALTREX) 500 MG tablet Take 500 mg by mouth daily.     Gust Brooms 500 MG/ML SOLN Take 2 grams of xyrem by mouth twice nightly. 1 Bottle 5  . zaleplon (SONATA) 10 MG capsule TAKE 1 CAPSULE BY MOUTH EVERY DAY AT BEDTIME AS NEEDED FOR SLEEP 30 capsule 0   No facility-administered medications prior to visit.  PAST MEDICAL HISTORY: Past Medical History:  Diagnosis Date  . Achilles rupture, left   . Actinic keratoses   . Arthritis   . Back pain    low  . Controlled narcolepsy 08/29/2014  . Diverticulitis   . Dyspnea    on exertion  . Fever blister    episodically  . Generalized headaches   . GERD (gastroesophageal reflux disease)   . Hepatitis A 1985  . Hypersomnia   . Knee injuries 2002   left  . Migraine   . Migraine headache with aura 08/29/2014  . Narcolepsy without cataplexy 04/12/2013    MSLT with  MSL 4.4 minutes.   . Pain    facial trigemonal  . Rotator cuff (capsule)  sprain 08/29/2014  . Seborrhea    chronic  . Sinusitis   . Tendinitis    left middle finger    PAST SURGICAL HISTORY: Past Surgical History:  Procedure Laterality Date  . COLONOSCOPY  2011-last   every 5 years  . FINGER SURGERY  2009/2010   trigger finger of left hand    . [place]   right inguinal   . LASIK  1998 or 99  . TONSILLECTOMY AND ADENOIDECTOMY  1960  . TRIGGER FINGER RELEASE  08/31/2012   Procedure: MINOR RELEASE TRIGGER FINGER/A-1 PULLEY;  Surgeon: Cammie Sickle., MD;  Location: [facility];  Service: Orthopedics;  Laterality: Right;  right long finger  . VASECTOMY      FAMILY HISTORY: Family History  Problem Relation Age of Onset  . Heart disease Father 73  . Hypertension Father   . Cancer Mother        breast,colon,skin  . Heart disease Mother        prostate  . CVA Maternal Grandmother   . Microcephaly Maternal Grandmother   . Microcephaly Paternal Grandfather     SOCIAL HISTORY: Social History   Socioeconomic History  . Marital status: Married    Spouse name: [name]  . Number of children: 2  . Years of education: College  . Highest education level: Not on file  Occupational History    Employer: [employer]  Social Needs  . Financial resource strain: Not on file  . Food insecurity:    Worry: Not on file    Inability: Not on file  . Transportation needs:    Medical: Not on file    Non-medical: Not on file  Tobacco Use  . Smoking status: Never Smoker  . Smokeless tobacco: Never Used  Substance and Sexual Activity  . Alcohol use: Yes    Alcohol/week: 7.0 standard drinks    Types: 7 Cans of beer per week    Comment: consumes one beer daily  . Drug use: No  . Sexual activity: Not on file  Lifestyle  . Physical activity:    Days per week: Not on file    Minutes per session: Not on file  . Stress: Not on file  Relationships  . Social connections:    Talks on phone: Not on file    Gets  together: Not on file    Attends religious service: Not on file    Active member of club or organization: Not on file    Attends meetings of clubs or organizations: Not on file    Relationship status: Not on file  . Intimate partner violence:    Fear of current or ex partner: Not on file    Emotionally abused:  Not on file    Physically abused: Not on file    Forced sexual activity: Not on file  Other Topics Concern  . Not on file  Social History Narrative   Patient is married [name]) and lives at home with his wife.   Patient has two adult children.   Patient is working full-time.   Patient has a Financial risk analyst, Production manager.   Patient is right-handed.   Patient drinks two cups of coffee in the morning.     PHYSICAL EXAM  Vitals:   05/19/18 1039  BP: 109/71  Pulse: 61  Weight: 167 lb 12.8 oz (76.1 kg)  Height: 5\' 7"  (1.702 m)   Body mass index is 26.28 kg/m.  Generalized: Well developed, in no acute distress  Head: normocephalic and atraumatic,. Oropharynx benign  Neck: Supple,   Cardiac: Regular rate rhythm, no murmur  Musculoskeletal: No deformity   Neurological examination   Mentation: Alert oriented to time, place, history taking. Attention span and concentration appropriate. Recent and remote memory intact.  Follows all commands speech and language fluent.   Cranial nerve II-XII: .Pupils were equal round reactive to light extraocular movements were full, visual field were full on confrontational test. Facial sensation and strength were normal. hearing was intact to finger rubbing bilaterally. Uvula tongue midline. head turning and shoulder shrug were normal and symmetric.Tongue protrusion into cheek strength was normal. Motor: normal bulk and tone, full strength in the BUE, BLE,  Sensory: normal and symmetric to light touch,  Coordination: finger-nose-finger, heel-to-shin bilaterally, no dysmetria Gait and Station: Rising up from seated position without  assistance, normal stance,  moderate stride, good arm swing, smooth turning, able to perform tiptoe, and heel walking without difficulty. Tandem gait is steady  DIAGNOSTIC DATA (LABS, IMAGING, TESTING) - I reviewed patient records, labs, notes, testing and imaging myself where available.      Component Value Date/Time   NA 141 02/10/2017 1631   K 5.1 02/10/2017 1631   CL 101 02/10/2017 1631   CO2 24 02/10/2017 1631   GLUCOSE 84 02/10/2017 1631   BUN 14 02/10/2017 1631   CREATININE 1.41 (H) 02/10/2017 1631   CALCIUM 10.0 02/10/2017 1631   PROT 7.0 02/10/2017 1631   ALBUMIN 4.7 02/10/2017 1631   AST 32 02/10/2017 1631   ALT 25 02/10/2017 1631   ALKPHOS 40 02/10/2017 1631   BILITOT 0.7 02/10/2017 1631   GFRNONAA 51 (L) 02/10/2017 1631   GFRAA 59 (L) 02/10/2017 1631   ASSESSMENT AND PLAN  72 y.o. year old male with narcolepsy without cataplexy on Xyrem doing well.  Vertigo and headaches improved.  [employer] as needed when traveling Narcolepsy, without cataplexy. On XYREM and Valtrex. Vertigo and headaches improved.    Plan:   Continue Xyrem at 2.0  gram twice nightly.  Does not need refills Will get labs today BMP Follow up  Dr. Brett Fairy  in 6 months per pt request Dennie Bible, [facility], [facility], [facility] [address], [place], [place] [address] [phone]

## 2018-05-19 ENCOUNTER — Ambulatory Visit (INDEPENDENT_AMBULATORY_CARE_PROVIDER_SITE_OTHER): Payer: Medicare Other | Admitting: Nurse Practitioner

## 2018-05-19 ENCOUNTER — Encounter: Payer: Self-pay | Admitting: Nurse Practitioner

## 2018-05-19 VITALS — BP 109/71 | HR 61 | Ht 67.0 in | Wt 167.8 lb

## 2018-05-19 DIAGNOSIS — R253 Fasciculation: Secondary | ICD-10-CM | POA: Diagnosis not present

## 2018-05-19 DIAGNOSIS — G47419 Narcolepsy without cataplexy: Secondary | ICD-10-CM | POA: Diagnosis not present

## 2018-05-19 NOTE — Patient Instructions (Signed)
Continue Xyrem at 2.0  gram twice nightly.   Will get labs today Follow up  Dr. Brett Fairy  in 6 months

## 2018-05-20 LAB — BASIC METABOLIC PANEL
BUN/Creatinine Ratio: 12 (ref 10–24)
BUN: 18 mg/dL (ref 8–27)
CALCIUM: 10 mg/dL (ref 8.6–10.2)
CO2: 23 mmol/L (ref 20–29)
Chloride: 101 mmol/L (ref 96–106)
Creatinine, Ser: 1.55 mg/dL — ABNORMAL HIGH (ref 0.76–1.27)
GFR calc Af Amer: 52 mL/min/{1.73_m2} — ABNORMAL LOW (ref >59)
GFR, EST NON AFRICAN AMERICAN: 45 mL/min/{1.73_m2} — AB (ref >59)
Glucose: 99 mg/dL (ref 65–99)
POTASSIUM: 5.4 mmol/L — AB (ref 3.5–5.2)
SODIUM: 137 mmol/L (ref 134–144)

## 2018-05-23 ENCOUNTER — Telehealth: Payer: Self-pay | Admitting: *Deleted

## 2018-05-23 NOTE — Telephone Encounter (Signed)
LVM advising patient this RN has his lab results. Advised he will get them on his My Chart as well.Left office number for call back to discuss.

## 2018-05-24 ENCOUNTER — Encounter: Payer: Self-pay | Admitting: *Deleted

## 2018-05-24 NOTE — Telephone Encounter (Signed)
Lab results letter mailed to patient.

## 2018-06-01 DIAGNOSIS — Z23 Encounter for immunization: Secondary | ICD-10-CM | POA: Diagnosis not present

## 2018-07-04 ENCOUNTER — Other Ambulatory Visit: Payer: Self-pay | Admitting: Neurology

## 2018-07-04 DIAGNOSIS — G43109 Migraine with aura, not intractable, without status migrainosus: Secondary | ICD-10-CM

## 2018-07-04 DIAGNOSIS — G47411 Narcolepsy with cataplexy: Secondary | ICD-10-CM

## 2018-07-04 DIAGNOSIS — G47419 Narcolepsy without cataplexy: Secondary | ICD-10-CM

## 2018-07-04 MED ORDER — XYREM 500 MG/ML PO SOLN: ORAL | 5 refills | Status: DC

## 2018-07-14 DIAGNOSIS — L918 Other hypertrophic disorders of the skin: Secondary | ICD-10-CM | POA: Diagnosis not present

## 2018-07-14 DIAGNOSIS — L821 Other seborrheic keratosis: Secondary | ICD-10-CM | POA: Diagnosis not present

## 2018-07-14 DIAGNOSIS — D1801 Hemangioma of skin and subcutaneous tissue: Secondary | ICD-10-CM | POA: Diagnosis not present

## 2018-07-14 DIAGNOSIS — D485 Neoplasm of uncertain behavior of skin: Secondary | ICD-10-CM | POA: Diagnosis not present

## 2018-07-14 DIAGNOSIS — L988 Other specified disorders of the skin and subcutaneous tissue: Secondary | ICD-10-CM | POA: Diagnosis not present

## 2018-07-14 DIAGNOSIS — L814 Other melanin hyperpigmentation: Secondary | ICD-10-CM | POA: Diagnosis not present

## 2018-07-14 DIAGNOSIS — D225 Melanocytic nevi of trunk: Secondary | ICD-10-CM | POA: Diagnosis not present

## 2018-07-14 DIAGNOSIS — L819 Disorder of pigmentation, unspecified: Secondary | ICD-10-CM | POA: Diagnosis not present

## 2018-07-14 DIAGNOSIS — D2261 Melanocytic nevi of right upper limb, including shoulder: Secondary | ICD-10-CM | POA: Diagnosis not present

## 2018-07-14 DIAGNOSIS — D2262 Melanocytic nevi of left upper limb, including shoulder: Secondary | ICD-10-CM | POA: Diagnosis not present

## 2018-07-14 DIAGNOSIS — L820 Inflamed seborrheic keratosis: Secondary | ICD-10-CM | POA: Diagnosis not present

## 2018-08-17 DIAGNOSIS — L988 Other specified disorders of the skin and subcutaneous tissue: Secondary | ICD-10-CM | POA: Diagnosis not present

## 2018-08-17 DIAGNOSIS — D485 Neoplasm of uncertain behavior of skin: Secondary | ICD-10-CM | POA: Diagnosis not present

## 2018-08-18 DIAGNOSIS — H43811 Vitreous degeneration, right eye: Secondary | ICD-10-CM | POA: Diagnosis not present

## 2018-08-18 DIAGNOSIS — G43809 Other migraine, not intractable, without status migrainosus: Secondary | ICD-10-CM | POA: Diagnosis not present

## 2018-08-18 DIAGNOSIS — H2513 Age-related nuclear cataract, bilateral: Secondary | ICD-10-CM | POA: Diagnosis not present

## 2018-08-24 NOTE — Progress Notes (Signed)
I agree with the assessment and plan as directed by NP on this visit . I was available for consultation.The patient will follow with me on his next visit.    Sharlize Hoar, MD

## 2018-09-18 DIAGNOSIS — Z125 Encounter for screening for malignant neoplasm of prostate: Secondary | ICD-10-CM | POA: Diagnosis not present

## 2018-09-18 DIAGNOSIS — R3129 Other microscopic hematuria: Secondary | ICD-10-CM | POA: Diagnosis not present

## 2018-09-18 DIAGNOSIS — Z800 Family history of malignant neoplasm of digestive organs: Secondary | ICD-10-CM | POA: Diagnosis not present

## 2018-09-18 DIAGNOSIS — G43109 Migraine with aura, not intractable, without status migrainosus: Secondary | ICD-10-CM | POA: Diagnosis not present

## 2018-09-18 DIAGNOSIS — N529 Male erectile dysfunction, unspecified: Secondary | ICD-10-CM | POA: Diagnosis not present

## 2018-09-18 DIAGNOSIS — Z0000 Encounter for general adult medical examination without abnormal findings: Secondary | ICD-10-CM | POA: Diagnosis not present

## 2018-09-18 DIAGNOSIS — K219 Gastro-esophageal reflux disease without esophagitis: Secondary | ICD-10-CM | POA: Diagnosis not present

## 2018-09-18 DIAGNOSIS — Z79899 Other long term (current) drug therapy: Secondary | ICD-10-CM | POA: Diagnosis not present

## 2018-09-18 DIAGNOSIS — G47429 Narcolepsy in conditions classified elsewhere without cataplexy: Secondary | ICD-10-CM | POA: Diagnosis not present

## 2018-09-18 DIAGNOSIS — Z23 Encounter for immunization: Secondary | ICD-10-CM | POA: Diagnosis not present

## 2018-09-18 DIAGNOSIS — E785 Hyperlipidemia, unspecified: Secondary | ICD-10-CM | POA: Diagnosis not present

## 2018-09-18 DIAGNOSIS — B009 Herpesviral infection, unspecified: Secondary | ICD-10-CM | POA: Diagnosis not present

## 2018-09-29 DIAGNOSIS — R7309 Other abnormal glucose: Secondary | ICD-10-CM | POA: Diagnosis not present

## 2018-11-22 NOTE — Progress Notes (Signed)
[facility] PATIENT: Christopher Weber DOB: 1945-12-08   REASON FOR VISIT: Follow-up for narcolepsy HISTORY FROM: Patient    HISTORY OF PRESENT ILLNESS:UPDATE 11/23/2018 [name], 73 year old male returns for follow-up with history of narcolepsy no cataplexy.  He has been doing well he takes Xyrem twice nightly except maybe 1 night a week he does not get in the second dose.  ESS is 5 recent labs performed by primary care in January to include CBC CMP hemoglobin A1c and lipid panel reported to be within normal limits.  I do not have access to that information.  Patient had right vitreous detachment while traveling in [place] in November.  He returns for reevaluation.  He also has Sonata to take when he is traveling on a as needed basis.  UPDATE 9/6/2019CM [name] , 73 year old male returns for follow-up with history of narcolepsy cataplexy.  He has been doing well.  He continues to take Xyrem at night sometimes he does not get in the second dose.  ESS score 7.  He has not had recent labs.  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  I have the pleasure of seeing Dr. [name] today on 02 September 2017 ina routine revisit to refill his XYREM.  He had recent labs drawn with his primary care physician on 14 December, Dr. Josetta Weber, and except for an elevated LDL and HDL-transaminases are normal, TSH was normal range, MPV is low at 6.7 ALP was low at 35, creatinine has always been elevated and remains at 1.4 with an estimated glomerular filtration rate of 50.    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 Xyrem has helped to achieve a higher quality of sleep, less fragmented sleep. But the lower dose has not allowed him to skip a dose without feeling sleepy again.  The patient also had noticed a decrease in headache frequency after he achieves better sleep. Since he has been treated with Valtrex for herpetic whitlow he noted further decrease in headache frequency and intensity and overall at the level of energy is well he feels cognitively brighter and he has not experienced vertigo in a while He  continues to use valtrex, has had a herpetic finger infection .     REVIEW OF SYSTEMS: Full 14 system review of systems performed and notable only for those listed, all others are neg:  Constitutional: neg  Cardiovascular: neg Ear/Nose/Throat: neg  Skin: neg Eyes: neg Respiratory: neg Gastroitestinal: neg  Hematology/Lymphatic: neg  Endocrine: neg Musculoskeletal:neg Allergy/Immunology: neg Neurological: neg Psychiatric: neg Sleep : Narcolepsy no cataplexy   ALLERGIES: Allergies  Allergen Reactions   Azithromycin Photosensitivity   Tetracyclines & Related     photosensitivity - can take medication but just must avoid being out in the sun    Doxycycline Photosensitivity   Dulcolax Stool Softener [Dss] Nausea And Vomiting    HOME MEDICATIONS: Outpatient Medications Prior to Visit  Medication Sig Dispense Refill   Famotidine (PEPCID PO) Take 1 tablet by mouth daily.     Multiple Vitamin (MULTIVITAMIN) capsule Take 1 capsule by mouth daily.     multivitamin-lutein (OCUVITE-LUTEIN) CAPS capsule Take 1 capsule by mouth daily.     Omega-3 Fatty Acids (FISH OIL) 500 MG CAPS Take by mouth. As  directed     SUMAtriptan (IMITREX) 100 MG tablet TAKE 1 TABLET EVERY 2 HOURS AS NEEDED FOR HEADACHE. MAY REPEAT IN 2 HRS IF HEADACHE PERSISTS 10 tablet 6   valACYclovir (VALTREX) 500 MG tablet Take 500 mg by mouth daily.      XYREM 500 MG/ML SOLN Take 2 grams of xyrem by mouth twice nightly. 1 Bottle 5   zaleplon (SONATA) 10 MG capsule TAKE 1 CAPSULE BY MOUTH EVERY DAY AT BEDTIME AS NEEDED FOR SLEEP 30 capsule 0   ranitidine (ZANTAC) 150 MG tablet  Take 150 mg by mouth daily.       No facility-administered medications prior to visit.     PAST MEDICAL HISTORY: Past Medical History:  Diagnosis Date   Achilles rupture, left    Actinic keratoses    Arthritis    Back pain    low   Controlled narcolepsy 08/29/2014   Diverticulitis    Dyspnea    on exertion   Fever blister    episodically   Generalized headaches    GERD (gastroesophageal reflux disease)    Hepatitis A 1985   Hypersomnia    Knee injuries 2002   left   Migraine    Migraine headache with aura 08/29/2014   Narcolepsy without cataplexy 04/12/2013    MSLT with  MSL 4.4 minutes.    Pain    facial trigemonal   Rotator cuff (capsule) sprain 08/29/2014   Seborrhea    chronic   Sinusitis    Tendinitis    left middle finger    PAST SURGICAL HISTORY: Past Surgical History:  Procedure Laterality Date   COLONOSCOPY  2011-last   every 5 years   FINGER SURGERY  2009/2010   trigger finger of left hand     [place]   right inguinal    LASIK  1998 or [place] RELEASE  08/31/2012   Procedure: MINOR RELEASE TRIGGER FINGER/A-1 PULLEY;  Surgeon: Cammie Sickle., MD;  Location: [place];  Service: Orthopedics;  Laterality: Right;  right long finger   VASECTOMY      FAMILY HISTORY: Family History  Problem Relation Age of Onset   Heart disease Father 35   Hypertension Father    Cancer Mother        breast,colon,skin   Heart disease Mother        prostate   CVA Maternal Grandmother    Microcephaly Maternal Grandmother    Microcephaly Paternal Grandfather     SOCIAL HISTORY: Social History   Socioeconomic History   Marital status: Married    Spouse name: Development worker, international aid   Number of children: 2   Years of education: Secretary/administrator   Highest education level: Not on file  Occupational History    Employer: [employer]  Social Needs    Financial resource strain: Not on file   Food insecurity:    Worry: Not on file    Inability: Not on file   Transportation needs:    Medical: Not on file    Non-medical: Not on file  Tobacco Use   Smoking status: Never Smoker   Smokeless tobacco: Never Used  Substance and Sexual Activity   Alcohol use: Yes    Alcohol/week: 7.0 standard drinks    Types: 7 Cans of beer per week    Comment: consumes one beer daily   Drug use: No   Sexual activity: Not on  file  Lifestyle   Physical activity:    Days per week: Not on file    Minutes per session: Not on file   Stress: Not on file  Relationships   Social connections:    Talks on phone: Not on file    Gets together: Not on file    Attends religious service: Not on file    Active member of club or organization: Not on file    Attends meetings of clubs or organizations: Not on file    Relationship status: Not on file   Intimate partner violence:    Fear of current or ex partner: Not on file    Emotionally abused: Not on file    Physically abused: Not on file    Forced sexual activity: Not on file  Other Topics Concern   Not on file  Social History Narrative   Patient is married ([place]) and lives at home with his wife.   Patient has two adult children.   Patient is working full-time.   Patient has a Financial risk analyst, Production manager.   Patient is right-handed.   Patient drinks two cups of coffee in the morning.     PHYSICAL EXAM  Vitals:   11/23/18 1405  BP: 118/77  Pulse: 60  Weight: 171 lb 9.6 oz (77.8 kg)  Height: 5\' 7"  (1.702 m)   Body mass index is 26.88 kg/m.  Generalized: Well developed, in no acute distress  Head: normocephalic and atraumatic,. Oropharynx benign  Neck: Supple,   Musculoskeletal: No deformity  Skin no rash or edema Neurological examination   Mentation: Alert oriented to time, place, history taking. Attention span and concentration appropriate. Recent and remote memory intact.   Follows all commands speech and language fluent.   Cranial nerve II-XII: .Pupils were equal round reactive to light extraocular movements were full, visual field were full on confrontational test. Facial sensation and strength were normal. hearing was intact to finger rubbing bilaterally. Uvula tongue midline. head turning and shoulder shrug were normal and symmetric.Tongue protrusion into cheek strength was normal. Motor: normal bulk and tone, full strength in the BUE, BLE,  Sensory: normal and symmetric to light touch,  Coordination: finger-nose-finger, heel-to-shin bilaterally, no dysmetria Gait and Station: Rising up from seated position without assistance, normal stance,  moderate stride, good arm swing, smooth turning, able to perform tiptoe, and heel walking without difficulty. Tandem gait is steady  DIAGNOSTIC DATA (LABS, IMAGING, TESTING) - I reviewed patient records, labs, notes, testing and imaging myself where available.      Component Value Date/Time   NA 137 05/19/2018 1107   K 5.4 (H) 05/19/2018 1107   CL 101 05/19/2018 1107   CO2 23 05/19/2018 1107   GLUCOSE 99 05/19/2018 1107   BUN 18 05/19/2018 1107   CREATININE 1.55 (H) 05/19/2018 1107   CALCIUM 10.0 05/19/2018 1107   PROT 7.0 02/10/2017 1631   ALBUMIN 4.7 02/10/2017 1631   AST 32 02/10/2017 1631   ALT 25 02/10/2017 1631   ALKPHOS 40 02/10/2017 1631   BILITOT 0.7 02/10/2017 1631   GFRNONAA 45 (L) 05/19/2018 1107   GFRAA 52 (L) 05/19/2018 1107   ASSESSMENT AND PLAN  73 y.o. year old male with narcolepsy without cataplexy on Xyrem doing well.  Vertigo and headaches improved.  [employer] as needed when traveling  Plan:   Continue Xyrem at 2.0  gram twice nightly.  Does not need refills Recent labs in  January  to include CBC CMP hemoglobin A1c  and lipids reported as normal at primary care Continue Sonata as needed Follow up 6 months Christopher Weber, [facility], [facility], APRN  [facility] [address] [place], [place] [address] [phone]                       [name] PATIENT: Christopher Weber DOB: Jun 27, 1946   REASON FOR VISIT: Follow-up for narcolepsy HISTORY FROM: Patient    HISTORY OF PRESENT ILLNESS:UPDATE 9/6/2019CM [name] , 73 year old male returns for follow-up with history of narcolepsy cataplexy.  He has been doing well.  He continues to take Xyrem at night sometimes he does not get in the second dose.  ESS score 7.  He has not had recent labs.  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  I have the pleasure of seeing Dr. [name] today on 02 September 2017 ina routine revisit to refill his XYREM.  He had recent labs drawn with his primary care physician on 14 December, Dr. Josetta Weber, and except for an elevated LDL and HDL-transaminases are normal, TSH was normal range, MPV is low at 6.7 ALP was low at 35, creatinine has always been elevated and remains at 1.4 with an estimated glomerular filtration rate of 50.    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 Xyrem has helped to achieve a higher quality of sleep, less fragmented sleep. But the lower dose has not allowed him to skip a dose without feeling sleepy again. The patient also had noticed a decrease in headache frequency after he achieves better sleep. Since he has been treated with Valtrex for herpetic whitlow he noted further decrease in headache frequency and intensity and overall at the level of energy is well he feels cognitively brighter and he has not experienced vertigo in a while He  continues to use valtrex, has had a herpetic finger infection .     REVIEW OF SYSTEMS: Full 14 system review of systems performed and notable only for those  listed, all others are neg:  Constitutional: neg  Cardiovascular: neg Ear/Nose/Throat: neg  Skin: neg Eyes: neg Respiratory: neg Gastroitestinal: neg  Hematology/Lymphatic: neg  Endocrine: neg Musculoskeletal:neg Allergy/Immunology: neg Neurological: neg Psychiatric: neg Sleep : Narcolepsy no cataplexy   ALLERGIES: Allergies  Allergen Reactions   Azithromycin Photosensitivity   Tetracyclines & Related     photosensitivity - can take medication but just must avoid being out in the sun    Doxycycline Photosensitivity   Dulcolax Stool Softener [Dss] Nausea And Vomiting    HOME MEDICATIONS: Outpatient Medications Prior to Visit  Medication Sig Dispense Refill   Famotidine (PEPCID PO) Take 1 tablet by mouth daily.     Multiple Vitamin (MULTIVITAMIN) capsule Take 1 capsule by mouth daily.     multivitamin-lutein (OCUVITE-LUTEIN) CAPS capsule Take 1 capsule by mouth daily.     Omega-3 Fatty Acids (FISH OIL) 500 MG CAPS Take by mouth. As directed     SUMAtriptan (IMITREX) 100 MG tablet TAKE 1 TABLET EVERY 2 HOURS AS NEEDED FOR HEADACHE. MAY REPEAT IN 2 HRS IF HEADACHE PERSISTS 10 tablet 6   valACYclovir (VALTREX) 500 MG tablet Take 500 mg by mouth daily.      XYREM 500 MG/ML SOLN  Take 2 grams of xyrem by mouth twice nightly. 1 Bottle 5   zaleplon (SONATA) 10 MG capsule TAKE 1 CAPSULE BY MOUTH EVERY DAY AT BEDTIME AS NEEDED FOR SLEEP 30 capsule 0   ranitidine (ZANTAC) 150 MG tablet Take 150 mg by mouth daily.       No facility-administered medications prior to visit.     PAST MEDICAL HISTORY: Past Medical History:  Diagnosis Date   Achilles rupture, left    Actinic keratoses    Arthritis    Back pain    low   Controlled narcolepsy 08/29/2014   Diverticulitis    Dyspnea    on exertion   Fever blister    episodically   Generalized headaches    GERD (gastroesophageal reflux disease)    Hepatitis A 1985   Hypersomnia    Knee injuries 2002    left   Migraine    Migraine headache with aura 08/29/2014   Narcolepsy without cataplexy 04/12/2013    MSLT with  MSL 4.4 minutes.    Pain    facial trigemonal   Rotator cuff (capsule) sprain 08/29/2014   Seborrhea    chronic   Sinusitis    Tendinitis    left middle finger    PAST SURGICAL HISTORY: Past Surgical History:  Procedure Laterality Date   COLONOSCOPY  2011-last   every 5 years   FINGER SURGERY  2009/2010   trigger finger of left hand     [place]   right inguinal    LASIK  1998 or [place] RELEASE  08/31/2012   Procedure: MINOR RELEASE TRIGGER FINGER/A-1 PULLEY;  Surgeon: Cammie Sickle., MD;  Location: [place];  Service: Orthopedics;  Laterality: Right;  right long finger   VASECTOMY      FAMILY HISTORY: Family History  Problem Relation Age of Onset   Heart disease Father 37   Hypertension Father    Cancer Mother        breast,colon,skin   Heart disease Mother        prostate   CVA Maternal Grandmother    Microcephaly Maternal Grandmother    Microcephaly Paternal Grandfather     SOCIAL HISTORY: Social History   Socioeconomic History   Marital status: Married    Spouse name: Development worker, international aid   Number of children: 2   Years of education: Secretary/administrator   Highest education level: Not on file  Occupational History    Employer: [employer]  Social Needs   Financial resource strain: Not on file   Food insecurity:    Worry: Not on file    Inability: Not on file   Transportation needs:    Medical: Not on file    Non-medical: Not on file  Tobacco Use   Smoking status: Never Smoker   Smokeless tobacco: Never Used  Substance and Sexual Activity   Alcohol use: Yes    Alcohol/week: 7.0 standard drinks    Types: 7 Cans of beer per week    Comment: consumes one beer daily   Drug use: No   Sexual activity: Not on file  Lifestyle    Physical activity:    Days per week: Not on file    Minutes per session: Not on file   Stress: Not on file  Relationships   Social connections:    Talks on phone: Not on file    Gets together: Not on  file    Attends religious service: Not on file    Active member of club or organization: Not on file    Attends meetings of clubs or organizations: Not on file    Relationship status: Not on file   Intimate partner violence:    Fear of current or ex partner: Not on file    Emotionally abused: Not on file    Physically abused: Not on file    Forced sexual activity: Not on file  Other Topics Concern   Not on file  Social History Narrative   Patient is married ([place]) and lives at home with his wife.   Patient has two adult children.   Patient is working full-time.   Patient has a Financial risk analyst, Production manager.   Patient is right-handed.   Patient drinks two cups of coffee in the morning.     PHYSICAL EXAM  Vitals:   11/23/18 1405  BP: 118/77  Pulse: 60  Weight: 171 lb 9.6 oz (77.8 kg)  Height: 5\' 7"  (1.702 m)   Body mass index is 26.88 kg/m.  Generalized: Well developed, in no acute distress  Head: normocephalic and atraumatic,. Oropharynx benign  Neck: Supple,   Cardiac: Regular rate rhythm, no murmur  Musculoskeletal: No deformity   Neurological examination   Mentation: Alert oriented to time, place, history taking. Attention span and concentration appropriate. Recent and remote memory intact.  Follows all commands speech and language fluent.   Cranial nerve II-XII: .Pupils were equal round reactive to light extraocular movements were full, visual field were full on confrontational test. Facial sensation and strength were normal. hearing was intact to finger rubbing bilaterally. Uvula tongue midline. head turning and shoulder shrug were normal and symmetric.Tongue protrusion into cheek strength was normal. Motor: normal bulk and tone, full strength in the BUE,  BLE,  Sensory: normal and symmetric to light touch,  Coordination: finger-nose-finger, heel-to-shin bilaterally, no dysmetria Gait and Station: Rising up from seated position without assistance, normal stance,  moderate stride, good arm swing, smooth turning, able to perform tiptoe, and heel walking without difficulty. Tandem gait is steady  DIAGNOSTIC DATA (LABS, IMAGING, TESTING) - I reviewed patient records, labs, notes, testing and imaging myself where available.      Component Value Date/Time   NA 137 05/19/2018 1107   K 5.4 (H) 05/19/2018 1107   CL 101 05/19/2018 1107   CO2 23 05/19/2018 1107   GLUCOSE 99 05/19/2018 1107   BUN 18 05/19/2018 1107   CREATININE 1.55 (H) 05/19/2018 1107   CALCIUM 10.0 05/19/2018 1107   PROT 7.0 02/10/2017 1631   ALBUMIN 4.7 02/10/2017 1631   AST 32 02/10/2017 1631   ALT 25 02/10/2017 1631   ALKPHOS 40 02/10/2017 1631   BILITOT 0.7 02/10/2017 1631   GFRNONAA 45 (L) 05/19/2018 1107   GFRAA 52 (L) 05/19/2018 1107   ASSESSMENT AND PLAN  73 y.o. year old male with narcolepsy without cataplexy on Xyrem doing well.  Vertigo and headaches improved.  [employer] as needed when traveling Narcolepsy, without cataplexy. On XYREM and Valtrex. Vertigo and headaches improved.    Plan:   Continue Xyrem at 2.0  gram twice nightly.   Continue Sonata as needed Labs were just done at PCP Follow up    in 6 months Christopher Weber, [facility], [facility], [facility] [address] [place], [place] [address] [phone]

## 2018-11-23 ENCOUNTER — Ambulatory Visit (INDEPENDENT_AMBULATORY_CARE_PROVIDER_SITE_OTHER): Payer: Medicare Other | Admitting: Nurse Practitioner

## 2018-11-23 ENCOUNTER — Other Ambulatory Visit: Payer: Self-pay

## 2018-11-23 ENCOUNTER — Encounter: Payer: Self-pay | Admitting: Nurse Practitioner

## 2018-11-23 VITALS — BP 118/77 | HR 60 | Ht 67.0 in | Wt 171.6 lb

## 2018-11-23 DIAGNOSIS — G47429 Narcolepsy in conditions classified elsewhere without cataplexy: Secondary | ICD-10-CM

## 2018-11-23 MED ORDER — ZALEPLON 10 MG PO CAPS
ORAL_CAPSULE | ORAL | 0 refills | Status: DC
Start: 2018-11-23 — End: 2019-05-31

## 2018-11-23 NOTE — Patient Instructions (Signed)
Continue Xyrem at 2.0  gram twice nightly.   Continue Sonata as needed Labs were just done at PCP Follow up    in 6 months

## 2018-12-21 ENCOUNTER — Other Ambulatory Visit: Payer: Self-pay | Admitting: Neurology

## 2019-01-01 DIAGNOSIS — R0982 Postnasal drip: Secondary | ICD-10-CM | POA: Diagnosis not present

## 2019-01-01 DIAGNOSIS — R05 Cough: Secondary | ICD-10-CM | POA: Diagnosis not present

## 2019-01-24 ENCOUNTER — Other Ambulatory Visit: Payer: Self-pay | Admitting: Neurology

## 2019-01-24 DIAGNOSIS — G47419 Narcolepsy without cataplexy: Secondary | ICD-10-CM

## 2019-01-24 DIAGNOSIS — G43109 Migraine with aura, not intractable, without status migrainosus: Secondary | ICD-10-CM

## 2019-01-24 DIAGNOSIS — G47411 Narcolepsy with cataplexy: Secondary | ICD-10-CM

## 2019-01-24 MED ORDER — XYREM 500 MG/ML PO SOLN: ORAL | 5 refills | Status: DC

## 2019-01-29 ENCOUNTER — Telehealth: Payer: Self-pay | Admitting: Neurology

## 2019-01-29 NOTE — Telephone Encounter (Signed)
PA COMPLETED THROUGH COVER MY MEDS FOR XYREM THROUGH BC[employer] OF [place]. [id number] CAN TAKE UP TO 3 DAYS BEFORE HEARING THE RESPONSE.

## 2019-01-29 NOTE — Telephone Encounter (Signed)
Pa approved for the patient until 01/29/2020 [place] [id number] PA [id number]

## 2019-02-22 DIAGNOSIS — Z03818 Encounter for observation for suspected exposure to other biological agents ruled out: Secondary | ICD-10-CM | POA: Diagnosis not present

## 2019-03-09 DIAGNOSIS — H43811 Vitreous degeneration, right eye: Secondary | ICD-10-CM | POA: Diagnosis not present

## 2019-03-09 DIAGNOSIS — H35371 Puckering of macula, right eye: Secondary | ICD-10-CM | POA: Diagnosis not present

## 2019-03-09 DIAGNOSIS — H2513 Age-related nuclear cataract, bilateral: Secondary | ICD-10-CM | POA: Diagnosis not present

## 2019-03-09 DIAGNOSIS — G43809 Other migraine, not intractable, without status migrainosus: Secondary | ICD-10-CM | POA: Diagnosis not present

## 2019-05-04 ENCOUNTER — Ambulatory Visit
Admission: RE | Admit: 2019-05-04 | Discharge: 2019-05-04 | Disposition: A | Discharge status: Home / Self Care | Payer: Medicare Other | Source: Ambulatory Visit | Attending: Internal Medicine | Admitting: Internal Medicine

## 2019-05-04 ENCOUNTER — Other Ambulatory Visit: Payer: Self-pay | Admitting: Internal Medicine

## 2019-05-04 DIAGNOSIS — R0600 Dyspnea, unspecified: Secondary | ICD-10-CM

## 2019-05-04 DIAGNOSIS — R059 Cough, unspecified: Secondary | ICD-10-CM

## 2019-05-04 DIAGNOSIS — Z79899 Other long term (current) drug therapy: Secondary | ICD-10-CM | POA: Diagnosis not present

## 2019-05-04 DIAGNOSIS — R002 Palpitations: Secondary | ICD-10-CM | POA: Diagnosis not present

## 2019-05-04 DIAGNOSIS — K219 Gastro-esophageal reflux disease without esophagitis: Secondary | ICD-10-CM | POA: Diagnosis not present

## 2019-05-04 DIAGNOSIS — R634 Abnormal weight loss: Secondary | ICD-10-CM

## 2019-05-04 DIAGNOSIS — R05 Cough: Secondary | ICD-10-CM | POA: Diagnosis not present

## 2019-05-04 DIAGNOSIS — R0602 Shortness of breath: Secondary | ICD-10-CM | POA: Diagnosis not present

## 2019-05-08 ENCOUNTER — Other Ambulatory Visit: Payer: Self-pay | Admitting: Neurology

## 2019-05-09 ENCOUNTER — Other Ambulatory Visit: Payer: Self-pay | Admitting: Internal Medicine

## 2019-05-09 DIAGNOSIS — R002 Palpitations: Secondary | ICD-10-CM

## 2019-05-25 DIAGNOSIS — Z23 Encounter for immunization: Secondary | ICD-10-CM | POA: Diagnosis not present

## 2019-05-30 ENCOUNTER — Telehealth: Payer: Self-pay | Admitting: Critical Care Medicine

## 2019-05-30 NOTE — Telephone Encounter (Signed)
[id number]

## 2019-05-31 ENCOUNTER — Ambulatory Visit (INDEPENDENT_AMBULATORY_CARE_PROVIDER_SITE_OTHER): Payer: Medicare Other | Admitting: Neurology

## 2019-05-31 ENCOUNTER — Other Ambulatory Visit: Payer: Self-pay

## 2019-05-31 ENCOUNTER — Encounter: Payer: Self-pay | Admitting: Neurology

## 2019-05-31 ENCOUNTER — Encounter: Payer: Self-pay | Admitting: Critical Care Medicine

## 2019-05-31 ENCOUNTER — Ambulatory Visit (INDEPENDENT_AMBULATORY_CARE_PROVIDER_SITE_OTHER): Payer: Medicare Other | Admitting: Critical Care Medicine

## 2019-05-31 VITALS — BP 128/78 | HR 56 | Temp 97.3°F | Ht 67.0 in | Wt 161.4 lb

## 2019-05-31 DIAGNOSIS — G43109 Migraine with aura, not intractable, without status migrainosus: Secondary | ICD-10-CM

## 2019-05-31 DIAGNOSIS — R05 Cough: Secondary | ICD-10-CM | POA: Diagnosis not present

## 2019-05-31 DIAGNOSIS — R0609 Other forms of dyspnea: Secondary | ICD-10-CM

## 2019-05-31 DIAGNOSIS — G47411 Narcolepsy with cataplexy: Secondary | ICD-10-CM

## 2019-05-31 DIAGNOSIS — G47419 Narcolepsy without cataplexy: Secondary | ICD-10-CM | POA: Diagnosis not present

## 2019-05-31 DIAGNOSIS — R059 Cough, unspecified: Secondary | ICD-10-CM

## 2019-05-31 MED ORDER — XYREM 500 MG/ML PO SOLN: ORAL | 3 refills | Status: DC

## 2019-05-31 MED ORDER — ZALEPLON 10 MG PO CAPS: ORAL_CAPSULE | ORAL | 0 refills | Status: DC

## 2019-05-31 NOTE — Progress Notes (Signed)
Synopsis: Referred in September 2020 for shortness of breath by [name], MD  Subjective:   PATIENT ID: Christopher Weber GENDER: male DOB: 02-Oct-1945, MRN: [id number]  Chief Complaint  Patient presents with   Consult    Consult for SOB that developed in feb. He reports having to take frequent breaks to catch his breath. He reports even while sitting he feels like he can't take a deep breath. Denies chest pain, reports its more tightness and pressure. He does report a intermittent dry cough that feels like a irritation at the back of his throat.     Christopher Weber is a 73 year old gentleman who presents for evaluation of dyspnea and cough that began in February 2020.  He started with a usual cold with a cough and rhinorrhea, without loss of taste or smell, fevers, or GI symptoms.  When his symptoms lasted several weeks he was given a course of doxycycline, which did not improve his symptoms.  He describes annual upper respiratory infections that persist for several weeks, but then usually resolve with antibiotics.  He was not checked for COVID at that time.  At baseline he works out on a regular basis, including lifting weights, and is not at all limited in his activity.  Over the last few months he does not have fatigue, but has to stop during workouts due to dyspnea.  He is able to complete his workouts with breaks.  He says breathing now feels like he is always wearing a mask.  Exercise worsens his symptoms, and rest relieves them.  A trial of intranasal steroids for a few days did not change his cough.  He has minimal sputum production, which he swallows.  His symptoms have persisted but not worsened.  He is concerned because he has friends that have died of IPF.  He lost 8 pounds between June and August, but his weight has remained unchanged over the last month. His appetite is unchanged.  He has a history of GERD, previously well controlled on Zantac.  He feels that since he switched to Pepcid  he awakens 1-2 nights per week with water brash.  He denies postnasal drip.  He is a never smoker and does not vape.  He never worked in [employer].  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  The only occupational exposure that he thinks would raise concern is the powder that was used in gloves in his lab back in the 60s before disposable gloves were used.  He has never had pet birds.  Although he has allergic rhinosinusitis when cutting the grass, which improves with Zyrtec, allergy testing in the past has been negative (antibody records that he provided reviewed).  He denies a family history of lung disease.  He is concerned about his symptoms starting several months after he was in [place] in November 2019 for 2 weeks.  He is convinced that his wife may have caught COVID on their trip-she had myalgias, fever, loss of taste and smell, and dyspnea, which improved over several days.  This was before COVID had been identified.  His wife completely recovered, and he was never ill around that.  Antibody testing for COVID was negative in June 2020 ([facility] record reviewed).  He reports that 20 years ago he had PFTs done at his primary care provider's office at [facility], which were normal.  He received his flu shot last week. Previous immunizations:  Prevnar 1311/08/2014, Pneumovax 2311/11/2012.     Past Medical History:  Diagnosis Date   Achilles rupture, left    Actinic keratoses    Arthritis    Back pain    low   Colon polyp 2015   Controlled narcolepsy 08/29/2014   Diverticulitis    Dyspnea    on exertion   Fever blister    episodically   Generalized headaches    GERD (gastroesophageal reflux disease)    Hepatitis A 1985   Hypersomnia    Knee injuries 2002   left   Migraine    Migraine headache with aura 08/29/2014   Narcolepsy without cataplexy 04/12/2013    MSLT with  MSL 4.4 minutes.    Pain    facial  trigemonal   Rotator cuff (capsule) sprain 08/29/2014   Seborrhea    chronic   Sinusitis    Tendinitis    left middle finger     Family History  Problem Relation Age of Onset   Heart disease Father 17   Hypertension Father    Prostate cancer Father    Cancer Mother        breast,colon,skin   Heart disease Mother    CVA Maternal Grandmother    Microcephaly Maternal Grandmother    Microcephaly Paternal Grandfather      Past Surgical History:  Procedure Laterality Date   COLONOSCOPY  2011-last   every 5 years   FINGER SURGERY  2009/2010   trigger finger of left hand     HERNIA REPAIR  1963   right inguinal    LASIK  1998 or [place] RELEASE  08/31/2012   Procedure: MINOR RELEASE TRIGGER FINGER/A-1 PULLEY;  Surgeon: Cammie Sickle., MD;  Location: [facility];  Service: Orthopedics;  Laterality: Right;  right long finger   VASECTOMY      Social History   Socioeconomic History   Marital status: Married    Spouse name: GraceAnn   Number of children: 2   Years of education: College   Highest education level: Not on file  Occupational History    Employer: [employer]  Social Needs   Financial resource strain: Not on file   Food insecurity    Worry: Not on file    Inability: Not on file   Transportation needs    Medical: Not on file    Non-medical: Not on file  Tobacco Use   Smoking status: Never Smoker   Smokeless tobacco: Never Used  Substance and Sexual Activity   Alcohol use: Yes    Alcohol/week: 7.0 standard drinks    Types: 7 Cans of beer per week    Comment: consumes one beer daily   Drug use: No   Sexual activity: Not on file  Lifestyle   Physical activity    Days per week: Not on file    Minutes per session: Not on file   Stress: Not on file  Relationships   Social connections    Talks on phone: Not on file    Gets together: Not on  file    Attends religious service: Not on file    Active member of club or organization: Not on file    Attends meetings of clubs or organizations: Not on file    Relationship status: Not on file   Intimate partner violence    Fear of current or ex partner: Not on file  Emotionally abused: Not on file    Physically abused: Not on file    Forced sexual activity: Not on file  Other Topics Concern   Not on file  Social History Narrative   Patient is married [name]) and lives at home with his wife.   Patient has two adult children.   Patient is working full-time.   Patient has a Financial risk analyst, Production manager.   Patient is right-handed.   Patient drinks two cups of coffee in the morning.     Allergies  Allergen Reactions   Azithromycin Photosensitivity   Tetracyclines & Related     photosensitivity - can take medication but just must avoid being out in the sun    Doxycycline Photosensitivity   Dulcolax Stool Softener [Dss] Nausea And Vomiting     Immunization History  Administered Date(s) Administered   Hepatitis A, Adult 06/01/2018   Hepatitis A, Ped/Adol-2 Dose 09/18/2018   Influenza-Unspecified 05/25/2019   Typhoid Inactivated 06/01/2018   Zoster Recombinat (Shingrix) 11/10/2018, 03/02/2019    Outpatient Medications Prior to Visit  Medication Sig Dispense Refill   Famotidine (PEPCID PO) Take 1 tablet by mouth daily.     mometasone (NASONEX) 50 MCG/ACT nasal spray SHAKE LQ AND U 1 SPR IEN BID     Multiple Vitamin (MULTIVITAMIN) capsule Take 1 capsule by mouth daily.     multivitamin-lutein (OCUVITE-LUTEIN) CAPS capsule Take 1 capsule by mouth daily.     Omega-3 Fatty Acids (FISH OIL) 500 MG CAPS Take by mouth. As directed     SUMAtriptan (IMITREX) 100 MG tablet TAKE 1 TABLET BY MOUTH EVERY 2 HOURS AS NEEDED FOR HEADACHE. MAY REPEAT 1 TABLET IN 2 HOURS IF HEADACHE PERSISTS. 10 tablet 3   valACYclovir (VALTREX) 500 MG tablet Take 500 mg by mouth daily.       XYREM 500 MG/ML SOLN Take 2 grams of xyrem by mouth twice nightly. 1 Bottle 5   zaleplon (SONATA) 10 MG capsule TAKE 1 CAPSULE BY MOUTH EVERY DAY AT BEDTIME AS NEEDED FOR SLEEP 30 capsule 0   No facility-administered medications prior to visit.     Review of Systems  Constitutional: Positive for weight loss. Negative for chills, fever and malaise/fatigue.  HENT: Positive for nosebleeds. Negative for congestion and sinus pain.   Eyes: Negative.   Respiratory: Positive for cough, sputum production and shortness of breath. Negative for hemoptysis and wheezing.   Cardiovascular: Negative for chest pain, palpitations and leg swelling.  Gastrointestinal: Positive for heartburn. Negative for abdominal pain, blood in stool, nausea and vomiting.  Genitourinary: Negative for dysuria and hematuria.  Musculoskeletal: Positive for back pain, joint pain and neck pain.  Skin: Negative for rash.  Neurological: Positive for headaches. Negative for focal weakness and weakness.  Endo/Heme/Allergies: Positive for environmental allergies.  Psychiatric/Behavioral: The patient has insomnia.      Objective:   Vitals:   05/31/19 1331  BP: 128/78  Pulse: (!) 56  Temp: (!) 97.3 F (36.3 C)  TempSrc: Temporal  SpO2: 98%  Weight: 161 lb 6.4 oz (73.2 kg)  Height: 5\' 7"  (1.702 m)   98% on  RA BMI Readings from Last 3 Encounters:  05/31/19 25.06 kg/m  05/31/19 25.28 kg/m  11/23/18 26.88 kg/m   Wt Readings from Last 3 Encounters:  05/31/19 160 lb (72.6 kg)  05/31/19 161 lb 6.4 oz (73.2 kg)  11/23/18 171 lb 9.6 oz (77.8 kg)    Physical Exam Vitals signs reviewed.  Constitutional:      Appearance:  Normal appearance. He is not ill-appearing or diaphoretic.     Comments: Healthy appearing elderly gentleman, very physically fit for his age.  HENT:     Head: Normocephalic and atraumatic.     Nose:     Comments: Deferred due to masking requirement.    Mouth/Throat:     Comments: Deferred  due to masking requirement. Eyes:     General: No scleral icterus. Neck:     Musculoskeletal: Neck supple.  Cardiovascular:     Rate and Rhythm: Normal rate and regular rhythm.     Heart sounds: No murmur.  Pulmonary:     Comments: Breathing comfortably on room air, no tachypnea.  No conversational dyspnea-speaking in full sentences.  No witnessed coughing.  Clear to auscultation bilaterally. Abdominal:     General: Abdomen is flat. There is no distension.     Palpations: Abdomen is soft.     Tenderness: There is no abdominal tenderness.  Musculoskeletal:        General: No swelling or deformity.     Comments: No synovitis or clubbing  Lymphadenopathy:     Cervical: No cervical adenopathy.  Skin:    General: Skin is warm and dry.     Findings: No bruising or rash.  Neurological:     General: No focal deficit present.     Mental Status: He is alert.     Motor: No weakness.     Coordination: Coordination normal.     Gait: Gait normal.  Psychiatric:        Mood and Affect: Mood normal.        Behavior: Behavior normal.      CBC Recent results provided by the the patient from his primary care physician demonstrate no anemia.  CHEMISTRY Recent results provided by the patient from his primary care provider demonstrate normal BMP.  Chest Imaging- films reviewed: CXR 05/04/1999 20-2 views, thoracic scoliosis, otherwise normal chest x-ray.  Pulmonary Functions Testing Results: No flowsheet data found.      Assessment & Plan:     ICD-10-CM   1. Dyspnea on exertion  R06.09 Pulmonary function test    CT Chest High Resolution    CANCELED: CT Chest High Resolution  2. Cough  R05    Dyspnea on exertion and cough causing activity limitation -PFTs -High-resolution CT including prone supine images to evaluate for ILD -Medications reviewed- no culprits for known causes of lung toxicity were identified. -Low suspicion for cardiac disease due to cough over the same timeframe as  dyspnea.   RTC in about 6 weeks after PFTs and CT chest.     Current Outpatient Medications:    Famotidine (PEPCID PO), Take 1 tablet by mouth daily., Disp: , Rfl:    mometasone (NASONEX) 50 MCG/ACT nasal spray, SHAKE LQ AND U 1 SPR IEN BID, Disp: , Rfl:    Multiple Vitamin (MULTIVITAMIN) capsule, Take 1 capsule by mouth daily., Disp: , Rfl:    multivitamin-lutein (OCUVITE-LUTEIN) CAPS capsule, Take 1 capsule by mouth daily., Disp: , Rfl:    Omega-3 Fatty Acids (FISH OIL) 500 MG CAPS, Take by mouth. As directed, Disp: , Rfl:    SUMAtriptan (IMITREX) 100 MG tablet, TAKE 1 TABLET BY MOUTH EVERY 2 HOURS AS NEEDED FOR HEADACHE. MAY REPEAT 1 TABLET IN 2 HOURS IF HEADACHE PERSISTS., Disp: 10 tablet, Rfl: 3   valACYclovir (VALTREX) 500 MG tablet, Take 500 mg by mouth daily. , Disp: , Rfl:    XYREM 500 MG/ML SOLN, Take  2 grams of xyrem by mouth twice nightly., Disp: 270 mL, Rfl: 3   zaleplon (SONATA) 10 MG capsule, TAKE 1 CAPSULE BY MOUTH EVERY DAY AT BEDTIME AS NEEDED FOR SLEEP, Disp: 30 capsule, Rfl: 0   Julian Hy, DO [facility] 05/31/2019 5:20 PM

## 2019-05-31 NOTE — Telephone Encounter (Signed)
lmtcb for pt.  Has pt had a known exposure or has any s/s?

## 2019-05-31 NOTE — Telephone Encounter (Signed)
Patient is returning call. CB [phone]

## 2019-05-31 NOTE — Patient Instructions (Signed)
Sodium Oxybate oral solution What is this medicine? SODIUM OXYBATE (SOE dee um OX i bate) is used to treat excessive sleepiness and cataplexy in patients with narcolepsy. Cataplexy causes a sudden muscle weakness due to a strong emotional response. This medicine may be used for other purposes; ask your healthcare provider or pharmacist if you have questions. This medicine may be used for other purposes; ask your health care provider or pharmacist if you have questions. COMMON BRAND NAME(S): Xyrem What should I tell my health care provider before I take this medicine? They need to know if you have any of these conditions:  diet low in salt  heart disease  high blood pressure  history of drug or alcohol abuse problem  if you often drink alcohol  kidney disease  liver disease  lung or breathing disease, like sleep apnea  mental illness  seizures  succinic semialdehyde dehydrogenase deficiency  suicidal thoughts, plans, or attempt; a previous suicide attempt by you or a family member  an unusual or allergic reaction to sodium oxybate, other medicines, foods, dyes, or preservatives  pregnant or trying to get pregnant  breast-feeding How should I use this medicine? Take this medicine by mouth. Follow the directions on the prescription label. Use a specially marked oral syringe, spoon, or dropper to measure each dose. Ask your pharmacist if you do not have one. Household spoons are not accurate. Take this medicine on an empty stomach, or at least 2 hours after food. Take your medicine at regular intervals. Do not take it more often than directed. Do not stop taking except on your doctor's advice. A special MedGuide will be given to you by the pharmacist with each prescription and refill. Be sure to read this information carefully each time. Talk to your pediatrician regarding the use of this medicine in children. While this drug may be prescribed for children as young as 7 years for  selected conditions, precautions do apply. Overdosage: If you think you have taken too much of this medicine contact a poison control center or emergency room at once. NOTE: This medicine is only for you. Do not share this medicine with others. What if I miss a dose? If you miss a dose, skip it. Take your next dose at the normal time. Do not take extra or 2 doses at the same time to make up for the missed dose. What may interact with this medicine? Do not take this medicine with any of the following medications:  alcohol  certain medicines for anxiety or sleep This medicine may also interact with the following medications:  antihistamines for allergy, cough and cold  certain medicines for depression, like amitriptyline, fluoxetine, sertraline  certain medicines for seizures like phenobarbital, primidone  divalproex sodium  general anesthetics like halothane, isoflurane, methoxyflurane, propofol  local anesthetics like lidocaine, pramoxine, tetracaine  medicines that relax muscles for surgery  narcotic medicines for pain  phenothiazines like chlorpromazine, mesoridazine, prochlorperazine, thioridazine  valproate or valproic acid This list may not describe all possible interactions. Give your health care provider a list of all the medicines, herbs, non-prescription drugs, or dietary supplements you use. Also tell them if you smoke, drink alcohol, or use illegal drugs. Some items may interact with your medicine. What should I watch for while using this medicine? Visit your doctor or health care professional for regular checks on your progress. Tell your healthcare professional if your symptoms do not start to get better or if they get worse. You may get drowsy  or dizzy. This medicine causes sleep very quickly. You should only take your first dose at bedtime, while in bed. The second dose should be taken 2.5 to 4 hours after your first dose. Do not drive, use machinery, or do anything  that needs mental alertness for at least 6 hours after taking this drug. Do not stand up or sit up quickly, especially if you are an older patient. This reduces the risk of dizzy or fainting spells. Alcohol may interfere with the effect of this medicine. Avoid alcoholic drinks. After taking this medicine, you may get up out of bed and do an activity that you do not know you are doing. The next morning, you may have no memory of this. Activities include driving a car ("sleep-driving"), making and eating food, talking on the phone, sexual activity, and sleep-walking. Serious injuries have occurred. Call your doctor right away if you find out you have done any of these activities. Do not take this medicine if you have used alcohol that evening. Do not take it if you have taken another medicine for sleep. The risk of doing these sleep-related activities is higher. If you or your family notice any changes in your behavior, such as new or worsening depression, thoughts of harming yourself, anxiety, other unusual or disturbing thoughts, or memory loss, call your healthcare professional right away. What side effects may I notice from receiving this medicine? Side effects that you should report to your doctor or health care professional as soon as possible:  allergic reactions like skin rash, itching or hives, swelling of the face, lips, or tongue  breathing problems  confusion  hallucinations  seizures  signs and symptoms of low blood pressure like dizziness; feeling faint or lightheaded, falls; unusually weak or tired  sleepwalking  suicidal thoughts, mood changes Side effects that usually do not require medical attention (report to your doctor or health care professional if they continue or are bothersome):  bedwetting  dizziness  drowsiness  headache  loss of appetite  nausea, vomiting  tremors This list may not describe all possible side effects. Call your doctor for medical advice  about side effects. You may report side effects to FDA at 1-800-FDA-1088. Where should I keep my medicine? Keep out of the reach of children. This medicine can be abused. Keep your medicine in a safe place to protect it from theft. Do not share this medicine with anyone. Selling or giving away this medicine is dangerous and against the law. Store at room temperature between 15 and 30 degrees C (59 and 86 degrees F). Keep this medicine in the original container. Throw away any unused medicine after the expiration date. This medicine may cause accidental overdose and death if it is taken by other adults, children, or pets. Flush any unused medicine down the toilet to reduce the chance of harm. Do not use the medicine after the expiration date. NOTE: This sheet is a summary. It may not cover all possible information. If you have questions about this medicine, talk to your doctor, pharmacist, or health care provider.  2020 Elsevier/Gold Standard (2018-02-24 12:30:01)

## 2019-05-31 NOTE — Patient Instructions (Addendum)
Thank you for visiting Dr. Carlis Abbott at [facility]. We recommend the following: Orders Placed This Encounter  Procedures  . CT Chest High Resolution  . Pulmonary function test   Orders Placed This Encounter  Procedures  . CT Chest High Resolution    Within the next month    Standing Status:   Future    Standing Expiration Date:   07/30/2020    Order Specific Question:   ** REASON FOR EXAM (FREE TEXT)    Answer:   concern for ILD- cough, SOB    Order Specific Question:   Preferred imaging location?    Answer:   [facility]    Order Specific Question:   Radiology Contrast Protocol - do NOT remove file path    Answer:   [email or web address]  . Pulmonary function test    Standing Status:   Future    Standing Expiration Date:   05/30/2020    Order Specific Question:   Where should this test be performed?    Answer:   [facility]    Order Specific Question:   Full PFT: includes the following: basic spirometry, spirometry pre & post bronchodilator, diffusion capacity (DLCO), lung volumes    Answer:   FULL PFT Without spirometry post bronchodilator    No orders of the defined types were placed in this encounter.   Return in about 6 weeks (around 07/12/2019).    Please do your part to reduce the spread of COVID-19.

## 2019-05-31 NOTE — Progress Notes (Signed)
[facility] PATIENT: Christopher Weber DOB: Sep 11, 1946   REASON FOR VISIT: Follow-up for narcolepsy HISTORY FROM: Patient    HISTORY OF PRESENT ILLNESS:  RV 05-31-2019,  Patient has returned from [place] in December, wife lost sense of smell and taste and still has not recovered. Had diarrhea, nausea and fever, myalgia, cough.  He did not get sick.  Has not tested positive for Covid- no tests at the time.  Continues on his medications. Refills needed. Had recent labs with PCP.    UPDATE 11/23/2018 [name], 73 year old male returns for follow-up with history of narcolepsy no cataplexy.  He has been doing well he takes Xyrem twice nightly except maybe 1 night a week he does not get in the second dose.  ESS is 5 recent labs performed by primary care in January to include CBC CMP hemoglobin A1c and lipid panel reported to be within normal limits.  I do not have access to that information.  Patient had right vitreous detachment while traveling in [place] in November.  He returns for reevaluation.  He also has Sonata to take when he is traveling on a as needed basis.  UPDATE 9/6/2019[name] , 73 year old male returns for follow-up with history of narcolepsy cataplexy.  He has been doing well.  He continues to take Xyrem at night sometimes he does not get in the second dose.  ESS score 7.  He has not had recent labs.  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 I have the pleasure of seeing [name] today on 02 September 2017 ina routine revisit to refill his XYREM.  He had recent labs drawn with his primary care physician on 14 December, [name], and except for an elevated LDL and HDL-transaminases are normal, TSH was normal range, MPV is low at 6.7 ALP was low at 35, creatinine has always been elevated and remains  at 1.4 with an estimated glomerular filtration rate of 50.   02/10/2017 Dr [name] is doing well, He continues to take 2 doses of Xyrem at nighttime but at a low dose. Xyrem has helped to achieve a higher quality of sleep, less fragmented sleep. But the lower dose has not allowed him to skip a dose without feeling sleepy again. The patient also had noticed a decrease in headache frequency after he achieves better sleep. Since he has been treated with Valtrex for herpetic whitlow he noted further decrease in headache frequency and intensity and overall at the level of energy is well he feels cognitively brighter and he has not experienced vertigo in a while He  continues to use valtrex, has had a herpetic finger infection .     REVIEW OF SYSTEMS: Full 14 system review of systems performed and notable only for those listed, all others are neg:  Sleep : Narcolepsy no cataplexy. Recent SOB and weight loss but no pulmonary abnormalities. Chest XRay.05-04-2019. SOB     ALLERGIES: Allergies  Allergen Reactions  . Azithromycin Photosensitivity  . Tetracyclines & Related     photosensitivity - can take medication but just must avoid being out in the sun   . Doxycycline Photosensitivity  . Dulcolax Stool Softener [Dss] Nausea And Vomiting    HOME MEDICATIONS: Outpatient Medications Prior to Visit  Medication Sig Dispense Refill  . Famotidine (PEPCID PO) Take 1 tablet by  mouth daily.    . mometasone (NASONEX) 50 MCG/ACT nasal spray SHAKE LQ AND U 1 SPR IEN BID    . Multiple Vitamin (MULTIVITAMIN) capsule Take 1 capsule by mouth daily.    . multivitamin-lutein (OCUVITE-LUTEIN) CAPS capsule Take 1 capsule by mouth daily.    . Omega-3 Fatty Acids (FISH OIL) 500 MG CAPS Take by mouth. As directed    . SUMAtriptan (IMITREX) 100 MG tablet TAKE 1 TABLET BY MOUTH EVERY 2 HOURS AS NEEDED FOR HEADACHE. MAY REPEAT 1 TABLET IN 2 HOURS IF HEADACHE PERSISTS. 10 tablet 3  . valACYclovir (VALTREX) 500 MG  tablet Take 500 mg by mouth daily.     Gust Brooms 500 MG/ML SOLN Take 2 grams of xyrem by mouth twice nightly. 1 Bottle 5  . zaleplon (SONATA) 10 MG capsule TAKE 1 CAPSULE BY MOUTH EVERY DAY AT BEDTIME AS NEEDED FOR SLEEP 30 capsule 0   No facility-administered medications prior to visit.     PAST MEDICAL HISTORY: Past Medical History:  Diagnosis Date  . Achilles rupture, left   . Actinic keratoses   . Arthritis   . Back pain    low  . Colon polyp 2015  . Controlled narcolepsy 08/29/2014  . Diverticulitis   . Dyspnea    on exertion  . Fever blister    episodically  . Generalized headaches   . GERD (gastroesophageal reflux disease)   . Hepatitis A 1985  . Hypersomnia   . Knee injuries 2002   left  . Migraine   . Migraine headache with aura 08/29/2014  . Narcolepsy without cataplexy 04/12/2013    MSLT with  MSL 4.4 minutes.   . Pain    facial trigemonal  . Rotator cuff (capsule) sprain 08/29/2014  . Seborrhea    chronic  . Sinusitis   . Tendinitis    left middle finger    PAST SURGICAL HISTORY: Past Surgical History:  Procedure Laterality Date  . COLONOSCOPY  2011-last   every 5 years  . FINGER SURGERY  2009/2010   trigger finger of left hand    . [place]   right inguinal   . LASIK  1998 or 99  . TONSILLECTOMY AND ADENOIDECTOMY  1960  . TRIGGER FINGER RELEASE  08/31/2012   Procedure: MINOR RELEASE TRIGGER FINGER/A-1 PULLEY;  Surgeon: Cammie Sickle., MD;  Location: [facility];  Service: Orthopedics;  Laterality: Right;  right long finger  . VASECTOMY      FAMILY HISTORY: Family History  Problem Relation Age of Onset  . Heart disease Father 75  . Hypertension Father   . Prostate cancer Father   . Cancer Mother        breast,colon,skin  . Heart disease Mother   . CVA Maternal Grandmother   . Microcephaly Maternal Grandmother   . Microcephaly Paternal Grandfather     SOCIAL HISTORY: Social History   Socioeconomic History   . Marital status: Married    Spouse name: [name]  . Number of children: 2  . Years of education: College  . Highest education level: Not on file  Occupational History    Employer: [employer] [id number]  Social Needs  . Financial resource strain: Not on file  . Food insecurity    Worry: Not on file    Inability: Not on file  . Transportation needs    Medical: Not on file    Non-medical: Not on file  Tobacco Use  . Smoking  status: Never Smoker  . Smokeless tobacco: Never Used  Substance and Sexual Activity  . Alcohol use: Yes    Alcohol/week: 7.0 standard drinks    Types: 7 Cans of beer per week    Comment: consumes one beer daily  . Drug use: No  . Sexual activity: Not on file  Lifestyle  . Physical activity    Days per week: Not on file    Minutes per session: Not on file  . Stress: Not on file  Relationships  . Social Herbalist on phone: Not on file    Gets together: Not on file    Attends religious service: Not on file    Active member of club or organization: Not on file    Attends meetings of clubs or organizations: Not on file    Relationship status: Not on file  . Intimate partner violence    Fear of current or ex partner: Not on file    Emotionally abused: Not on file    Physically abused: Not on file    Forced sexual activity: Not on file  Other Topics Concern  . Not on file  Social History Narrative   Patient is married [name]) and lives at home with his wife.   Patient has two adult children.   Patient is working full-time.   Patient has a Financial risk analyst, Production manager.   Patient is right-handed.   Patient drinks two cups of coffee in the morning.     PHYSICAL EXAM  Vitals:   05/31/19 1534  BP: 108/72  Pulse: (!) 47  Temp: 97.9 F (36.6 C)  Weight: 160 lb (72.6 kg)  Height: 5\' 7"  (1.702 m)   Body mass index is 25.06 kg/m.  Generalized: Well developed, in no acute distress  Head: normocephalic and atraumatic,. Oropharynx  benign  Neck: Supple,   Musculoskeletal: No deformity  Skin no rash or edema Neurological examination   Mentation: Alert oriented to time, place, history taking. Attention span and concentration appropriate. Recent and remote memory intact.  Follows all commands speech and language fluent.   Cranial nerve II-XII: .Pupils were equal round reactive to light extraocular movements were full, visual field were full on confrontational test. Facial sensation and strength were normal. hearing was intact to finger rubbing bilaterally. Uvula tongue midline. head turning and shoulder shrug were normal and symmetric.Tongue protrusion into cheek strength was normal. Motor: normal bulk and tone, full strength in the BUE, BLE,  Sensory: normal and symmetric to light touch,  Coordination: finger-nose-finger, heel-to-shin bilaterally, no dysmetria Gait and Station: Rising up from seated position without assistance, normal stance,  moderate stride, good arm swing, smooth turning, able to perform tiptoe, and heel walking without difficulty. Tandem gait is steady  DIAGNOSTIC DATA (LABS, IMAGING, TESTING) - I reviewed patient records, labs, notes, testing and imaging myself where available. How likely are you to doze in the following situations: 0 = not likely, 1 = slight chance, 2 = moderate chance, 3 = high chance  Sitting and Reading? Watching Television? Sitting inactive in a public place (theater or meeting)? Lying down in the afternoon when circumstances permit? Sitting and talking to someone? Sitting quietly after lunch without alcohol? In a car, while stopped for a few minutes in traffic? As a passenger in a car for an hour without a break?  Total = 5/ 24          Component Value Date/Time   NA 137 05/19/2018 1107  K 5.4 (H) 05/19/2018 1107   CL 101 05/19/2018 1107   CO2 23 05/19/2018 1107   GLUCOSE 99 05/19/2018 1107   BUN 18 05/19/2018 1107   CREATININE 1.55 (H) 05/19/2018 1107    CALCIUM 10.0 05/19/2018 1107   PROT 7.0 02/10/2017 1631   ALBUMIN 4.7 02/10/2017 1631   AST 32 02/10/2017 1631   ALT 25 02/10/2017 1631   ALKPHOS 40 02/10/2017 1631   BILITOT 0.7 02/10/2017 1631   GFRNONAA 45 (L) 05/19/2018 1107   GFRAA 52 (L) 05/19/2018 1107   ASSESSMENT AND PLAN  73 y.o. year old male with narcolepsy without cataplexy on Xyrem doing well.  Vertigo and headaches improved.  [employer] as needed when traveling  Plan:   Continue Xyrem at 2.0  gram twice nightly.  Does not need refills Recent labs in  January  to include CBC CMP hemoglobin A1c and lipids reported as normal at primary care Continue Sonata as needed Follow up 6 months Dennie Bible, [facility], APRN  [facility] [address] [place], [place] [address] [phone]                       [name] PATIENT: Christopher Weber DOB: August 13, 1946   REASON FOR VISIT: Follow-up for narcolepsy HISTORY FROM: Patient    HISTORY OF PRESENT ILLNESS:UPDATE 9/6/2019[name] , 73 year old male returns for follow-up with history of narcolepsy cataplexy.  He has been doing well.  He continues to take Xyrem at night sometimes he does not get in the second dose.  ESS score 7.  He has not had recent labs.  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  I have the pleasure of seeing [name] today on 02 September 2017 ina routine revisit to refill his XYREM.  He had recent labs drawn with his primary care physician on 14 December, [name], and except for an elevated LDL and HDL-transaminases are normal, TSH was normal range, MPV is low at 6.7 ALP was low at 35, creatinine has always been elevated and remains at 1.4 with an estimated glomerular filtration rate of 50.    02/10/2017 Dr  [name] is doing well, He continues to take 2 doses of Xyrem at nighttime but at a low dose. Xyrem has helped to achieve a higher quality of sleep, less fragmented sleep. But the lower dose has not allowed him to skip a dose without feeling sleepy again. The patient also had noticed a decrease in headache frequency after he achieves better sleep. Since he has been treated with Valtrex for herpetic whitlow he noted further decrease in headache frequency and intensity and overall at the level of energy is well he feels cognitively brighter and he has not experienced vertigo in a while He  continues to use valtrex, has had a herpetic finger infection .     REVIEW OF SYSTEMS: Full 14 system review of systems performed and notable only for those listed, all others are neg:  Constitutional: neg  Cardiovascular: neg Ear/Nose/Throat: neg  Skin: neg Eyes: neg Respiratory: neg Gastroitestinal: neg  Hematology/Lymphatic: neg  Endocrine: neg Musculoskeletal:neg Allergy/Immunology: neg Neurological: neg Psychiatric: neg Sleep : Narcolepsy no cataplexy   ALLERGIES: Allergies  Allergen Reactions  . Azithromycin Photosensitivity  . Tetracyclines & Related  photosensitivity - can take medication but just must avoid being out in the sun   . Doxycycline Photosensitivity  . Dulcolax Stool Softener [Dss] Nausea And Vomiting    HOME MEDICATIONS: Outpatient Medications Prior to Visit  Medication Sig Dispense Refill  . Famotidine (PEPCID PO) Take 1 tablet by mouth daily.    . mometasone (NASONEX) 50 MCG/ACT nasal spray SHAKE LQ AND U 1 SPR IEN BID    . Multiple Vitamin (MULTIVITAMIN) capsule Take 1 capsule by mouth daily.    . multivitamin-lutein (OCUVITE-LUTEIN) CAPS capsule Take 1 capsule by mouth daily.    . Omega-3 Fatty Acids (FISH OIL) 500 MG CAPS Take by mouth. As directed    . SUMAtriptan (IMITREX) 100 MG tablet TAKE 1 TABLET BY MOUTH EVERY 2 HOURS AS NEEDED FOR HEADACHE. MAY REPEAT 1  TABLET IN 2 HOURS IF HEADACHE PERSISTS. 10 tablet 3  . valACYclovir (VALTREX) 500 MG tablet Take 500 mg by mouth daily.     Gust Brooms 500 MG/ML SOLN Take 2 grams of xyrem by mouth twice nightly. 1 Bottle 5  . zaleplon (SONATA) 10 MG capsule TAKE 1 CAPSULE BY MOUTH EVERY DAY AT BEDTIME AS NEEDED FOR SLEEP 30 capsule 0   No facility-administered medications prior to visit.     PAST MEDICAL HISTORY: Past Medical History:  Diagnosis Date  . Achilles rupture, left   . Actinic keratoses   . Arthritis   . Back pain    low  . Colon polyp 2015  . Controlled narcolepsy 08/29/2014  . Diverticulitis   . Dyspnea    on exertion  . Fever blister    episodically  . Generalized headaches   . GERD (gastroesophageal reflux disease)   . Hepatitis A 1985  . Hypersomnia   . Knee injuries 2002   left  . Migraine   . Migraine headache with aura 08/29/2014  . Narcolepsy without cataplexy 04/12/2013    MSLT with  MSL 4.4 minutes.   . Pain    facial trigemonal  . Rotator cuff (capsule) sprain 08/29/2014  . Seborrhea    chronic  . Sinusitis   . Tendinitis    left middle finger    PAST SURGICAL HISTORY: Past Surgical History:  Procedure Laterality Date  . COLONOSCOPY  2011-last   every 5 years  . FINGER SURGERY  2009/2010   trigger finger of left hand    . [place]   right inguinal   . LASIK  1998 or 99  . TONSILLECTOMY AND ADENOIDECTOMY  1960  . TRIGGER FINGER RELEASE  08/31/2012   Procedure: MINOR RELEASE TRIGGER FINGER/A-1 PULLEY;  Surgeon: Cammie Sickle., MD;  Location: [facility];  Service: Orthopedics;  Laterality: Right;  right long finger  . VASECTOMY      FAMILY HISTORY: Family History  Problem Relation Age of Onset  . Heart disease Father 66  . Hypertension Father   . Prostate cancer Father   . Cancer Mother        breast,colon,skin  . Heart disease Mother   . CVA Maternal Grandmother   . Microcephaly Maternal Grandmother   . Microcephaly  Paternal Grandfather     SOCIAL HISTORY: Social History   Socioeconomic History  . Marital status: Married    Spouse name: [name]  . Number of children: 2  . Years of education: College  . Highest education level: Not on file  Occupational History    Employer: [employer] [id number]  Social Needs  . Financial resource strain: Not on file  . Food insecurity    Worry: Not on file    Inability: Not on file  . Transportation needs    Medical: Not on file    Non-medical: Not on file  Tobacco Use  . Smoking status: Never Smoker  . Smokeless tobacco: Never Used  Substance and Sexual Activity  . Alcohol use: Yes    Alcohol/week: 7.0 standard drinks    Types: 7 Cans of beer per week    Comment: consumes one beer daily  . Drug use: No  . Sexual activity: Not on file  Lifestyle  . Physical activity    Days per week: Not on file    Minutes per session: Not on file  . Stress: Not on file  Relationships  . Social Herbalist on phone: Not on file    Gets together: Not on file    Attends religious service: Not on file    Active member of club or organization: Not on file    Attends meetings of clubs or organizations: Not on file    Relationship status: Not on file  . Intimate partner violence    Fear of current or ex partner: Not on file    Emotionally abused: Not on file    Physically abused: Not on file    Forced sexual activity: Not on file  Other Topics Concern  . Not on file  Social History Narrative   Patient is married [name]) and lives at home with his wife.   Patient has two adult children.   Patient is working full-time.   Patient has a Financial risk analyst, Production manager.   Patient is right-handed.   Patient drinks two cups of coffee in the morning.     PHYSICAL EXAM  Vitals:   05/31/19 1534  BP: 108/72  Pulse: (!) 47  Temp: 97.9 F (36.6 C)  Weight: 160 lb (72.6 kg)  Height: 5\' 7"  (1.702 m)   Body mass index is 25.06 kg/m.  Generalized:  Well developed, in no acute distress  Head: normocephalic and atraumatic,. Oropharynx benign  Neck: Supple,   Cardiac: Regular rate rhythm, no murmur  Musculoskeletal: No deformity   Neurological examination   Mentation: Alert oriented to time, place, history taking. Attention span and concentration appropriate. Recent and remote memory intact.  Follows all commands speech and language fluent.   Cranial nerve : no loss of taste or smell. .Pupils were equal round reactive to light extraocular movements were full, visual field were full on confrontational test. Facial sensation and strength were normal. hearing was intact to finger rubbing bilaterally.  Uvula tongue midline. head turning and shoulder shrug were normal and symmetric.Tongue protrusion into cheek strength was normal. Motor: normal bulk and tone, full strength in the BUE, BLE,  Sensory: normal and symmetric to light touch,  Coordination: finger-nose-finger, heel-to-shin bilaterally, no dysmetria Gait and Station: Rising up from seated position without assistance, normal stance,  moderate stride, good arm swing, smooth turning, able to perform tiptoe, and heel walking without difficulty. Tandem gait is steady  DIAGNOSTIC DATA (LABS, IMAGING, TESTING) - I reviewed patient records, labs, notes, testing and imaging myself where available.  Labs from dr gates quoted, scanned into media.     Component Value Date/Time   NA 137 05/19/2018 1107   K 5.4 (H) 05/19/2018 1107   CL 101 05/19/2018 1107   CO2 23 05/19/2018 1107   GLUCOSE 99 05/19/2018  1107   BUN 18 05/19/2018 1107   CREATININE 1.55 (H) 05/19/2018 1107   CALCIUM 10.0 05/19/2018 1107   PROT 7.0 02/10/2017 1631   ALBUMIN 4.7 02/10/2017 1631   AST 32 02/10/2017 1631   ALT 25 02/10/2017 1631   ALKPHOS 40 02/10/2017 1631   BILITOT 0.7 02/10/2017 1631   GFRNONAA 45 (L) 05/19/2018 1107   GFRAA 52 (L) 05/19/2018 1107   ASSESSMENT AND PLAN  73 y.o. year old male with  narcolepsy without cataplexy on Xyrem doing well.  Vertigo and headaches improved.  [employer] as needed when traveling.  Narcolepsy is the diagnosis, without cataplexy.  On XYREM and Valtrex. Vertigo and headaches remain improved.    Plan:   Continue Xyrem at 2.0  gram twice nightly.   Continue Sonata as needed Labs were just done at PCP Follow up every 6 months  [name], MD   [facility] [address] [place], [place] [address] [phone]

## 2019-05-31 NOTE — Telephone Encounter (Signed)
LMTCB x2 for pt 

## 2019-06-01 NOTE — Telephone Encounter (Signed)
Will close encounter since patient was seen yesterday by Dr. Carlis Abbott.

## 2019-06-05 ENCOUNTER — Other Ambulatory Visit: Payer: Self-pay | Admitting: Critical Care Medicine

## 2019-06-05 ENCOUNTER — Telehealth: Payer: Self-pay | Admitting: Critical Care Medicine

## 2019-06-05 NOTE — Telephone Encounter (Signed)
Called and spoke w/ pt to set up COVID-19 pre-procedural test. Pt agreed to scheduling appt for 06/15/2019, three days before his PFT scheduled for 06/18/2019. Pt is aware of instructions and I have sent him the [facility] testing site address. Appt has been scheduled and PFT note has been updated to reflect this. Nothing further needed at this time.

## 2019-06-15 ENCOUNTER — Other Ambulatory Visit (HOSPITAL_COMMUNITY)
Admission: RE | Admit: 2019-06-15 | Discharge: 2019-06-15 | Disposition: A | Discharge status: Home / Self Care | Payer: Medicare Other | Source: Ambulatory Visit | Attending: Critical Care Medicine | Admitting: Critical Care Medicine

## 2019-06-15 DIAGNOSIS — Z01812 Encounter for preprocedural laboratory examination: Secondary | ICD-10-CM | POA: Insufficient documentation

## 2019-06-15 DIAGNOSIS — Z20828 Contact with and (suspected) exposure to other viral communicable diseases: Secondary | ICD-10-CM | POA: Diagnosis not present

## 2019-06-17 LAB — NOVEL CORONAVIRUS, NAA (HOSP ORDER, SEND-OUT TO REF LAB; TAT 18-24 HRS): SARS-CoV-2, NAA: NOT DETECTED

## 2019-06-18 ENCOUNTER — Other Ambulatory Visit: Payer: Self-pay

## 2019-06-18 ENCOUNTER — Ambulatory Visit: Payer: Medicare Other

## 2019-06-18 ENCOUNTER — Ambulatory Visit (INDEPENDENT_AMBULATORY_CARE_PROVIDER_SITE_OTHER): Payer: Medicare Other | Admitting: Critical Care Medicine

## 2019-06-18 DIAGNOSIS — R002 Palpitations: Secondary | ICD-10-CM

## 2019-06-18 DIAGNOSIS — R0600 Dyspnea, unspecified: Secondary | ICD-10-CM

## 2019-06-18 DIAGNOSIS — R0609 Other forms of dyspnea: Secondary | ICD-10-CM

## 2019-06-18 LAB — PULMONARY FUNCTION TEST
DL/VA % pred: 114 %
DL/VA: 4.69 ml/min/mmHg/L
DLCO unc % pred: 122 %
DLCO unc: 28.99 ml/min/mmHg
FEF 25-75 Post: 2.73 L/sec
FEF 25-75 Pre: 2.47 L/sec
FEF2575-%Change-Post: 10 %
FEF2575-%Pred-Post: 124 %
FEF2575-%Pred-Pre: 112 %
FEV1-%Change-Post: 2 %
FEV1-%Pred-Post: 114 %
FEV1-%Pred-Pre: 112 %
FEV1-Post: 3.3 L
FEV1-Pre: 3.23 L
FEV1FVC-%Change-Post: 6 %
FEV1FVC-%Pred-Pre: 102 %
FEV6-%Change-Post: -3 %
FEV6-%Pred-Post: 111 %
FEV6-%Pred-Pre: 116 %
FEV6-Post: 4.13 L
FEV6-Pre: 4.3 L
FEV6FVC-%Pred-Post: 106 %
FEV6FVC-%Pred-Pre: 106 %
FVC-%Change-Post: -3 %
FVC-%Pred-Post: 105 %
FVC-%Pred-Pre: 109 %
FVC-Post: 4.13 L
FVC-Pre: 4.3 L
Post FEV1/FVC ratio: 80 %
Post FEV6/FVC ratio: 100 %
Pre FEV1/FVC ratio: 75 %
Pre FEV6/FVC Ratio: 100 %
RV % pred: 99 %
RV: 2.28 L
TLC % pred: 100 %
TLC: 6.47 L

## 2019-06-18 NOTE — Progress Notes (Signed)
Full PFT performed today. °

## 2019-06-28 ENCOUNTER — Ambulatory Visit (INDEPENDENT_AMBULATORY_CARE_PROVIDER_SITE_OTHER)
Admission: RE | Admit: 2019-06-28 | Discharge: 2019-06-28 | Disposition: A | Discharge status: Home / Self Care | Payer: Medicare Other | Source: Ambulatory Visit | Attending: Critical Care Medicine | Admitting: Critical Care Medicine

## 2019-06-28 ENCOUNTER — Other Ambulatory Visit: Payer: Self-pay

## 2019-06-28 DIAGNOSIS — R911 Solitary pulmonary nodule: Secondary | ICD-10-CM | POA: Diagnosis not present

## 2019-06-28 DIAGNOSIS — R0600 Dyspnea, unspecified: Secondary | ICD-10-CM

## 2019-06-28 DIAGNOSIS — R0609 Other forms of dyspnea: Secondary | ICD-10-CM

## 2019-06-29 ENCOUNTER — Telehealth: Payer: Self-pay | Admitting: Critical Care Medicine

## 2019-06-29 DIAGNOSIS — Z86010 Personal history of colonic polyps: Secondary | ICD-10-CM | POA: Diagnosis not present

## 2019-06-29 DIAGNOSIS — K219 Gastro-esophageal reflux disease without esophagitis: Secondary | ICD-10-CM | POA: Diagnosis not present

## 2019-06-29 NOTE — Telephone Encounter (Signed)
Left message for patient to call back  

## 2019-06-29 NOTE — Telephone Encounter (Signed)
Will route to Dr. Carlis Abbott so she is aware.

## 2019-06-29 NOTE — Telephone Encounter (Signed)
Excellent. That may help his symptoms. Thanks!

## 2019-07-03 NOTE — Telephone Encounter (Signed)
ATC pt, line went to voicemail. I have left a detailed message for pt (per DPR) regarding his CT Scan results from 06/29/2019 and let him know to give our office a call back if he has any additional questions or concerns. Result note has been updated to reflect this. Nothing further needed at this time.

## 2019-07-19 ENCOUNTER — Other Ambulatory Visit: Payer: Self-pay

## 2019-07-19 ENCOUNTER — Encounter: Payer: Self-pay | Admitting: Critical Care Medicine

## 2019-07-19 ENCOUNTER — Ambulatory Visit (INDEPENDENT_AMBULATORY_CARE_PROVIDER_SITE_OTHER): Payer: Medicare Other | Admitting: Critical Care Medicine

## 2019-07-19 VITALS — BP 108/70 | HR 68 | Temp 98.1°F | Ht 67.0 in | Wt 164.2 lb

## 2019-07-19 DIAGNOSIS — K219 Gastro-esophageal reflux disease without esophagitis: Secondary | ICD-10-CM

## 2019-07-19 NOTE — Patient Instructions (Signed)
Thank you for visiting Dr. Yoshio Seliga at [facility]. We recommend the following:   Return if symptoms worsen or fail to improve.    Please do your part to reduce the spread of COVID-19. ' 

## 2019-07-19 NOTE — Progress Notes (Signed)
Synopsis: Referred in September 2020 for shortness of breath by Christopher Huddle, MD.  Subjective:   PATIENT ID: Christopher Weber GENDER: male DOB: 02-23-1946, MRN: [id number]  Chief Complaint  Patient presents with   Follow-up    [name] is a 73 year old gentleman who is here for follow-up of dyspnea on exertion.  This is improved significantly since his last visit with significant escalation in his GERD regimen.  He is now taking pantoprazole 20 mg in the morning and omeprazole plus sodium bicarb x 2 pills at night at the recommendation of his gastroenterologist. He is scheduled for an EGD and colonoscopy on 11/20.  He denies cough or activity limitations.  He feels like his breathing is baseline back to normal.  He was able to run around with his grandkids recently.    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  He started with a usual cold with a cough and rhinorrhea, without loss of taste or smell, fevers, or GI symptoms.  When his symptoms lasted several weeks he was given a course of doxycycline, which did not improve his symptoms.  He describes annual upper respiratory infections that persist for several weeks, but then usually resolve with antibiotics.  He was not checked for COVID at that time.  At baseline he works out on a regular basis, including lifting weights, and is not at all limited in his activity.  Over the last few months he does not have fatigue, but has to stop during workouts due to dyspnea.  He is able to complete his workouts with breaks.  He says breathing now feels like he is always wearing a mask.  Exercise worsens his symptoms, and rest relieves them.  A trial of intranasal steroids for a few days did not change his cough.  He has minimal sputum production, which he swallows.  His symptoms have persisted but not worsened.  He is concerned because he has friends that have died of IPF.   He lost 8 pounds between June and August, but his weight has remained unchanged over the last month. His appetite is unchanged.  He has a history of GERD, previously well controlled on Zantac.  He feels that since he switched to Pepcid he awakens 1-2 nights per week with water brash.  He denies postnasal drip.  He is a never smoker and does not vape.  He never worked in [employer].  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  The only occupational exposure that he thinks would raise concern is the powder that was used in gloves in his lab back in the 60s before disposable gloves were used.  He has never had pet birds.  Although he has allergic rhinosinusitis when cutting the grass, which improves with Zyrtec, allergy testing in the past has been negative (antibody records that he provided reviewed).  He denies a family history of lung disease.  He is concerned about his symptoms starting several months after he was in [place] in November 2019 for 2 weeks.  He is convinced that his wife may have caught COVID on their trip-she had myalgias, fever, loss of taste and smell, and dyspnea, which improved over several days.  This was before COVID had been identified.  His wife completely recovered, and he was never ill around that.  Antibody testing for COVID was  negative in June 2020 ([facility] record reviewed).  He reports that 20 years ago he had PFTs done at his primary care provider's office at [place], which were normal.  He received his flu shot last week. Previous immunizations: Prevnar 1311/08/2014, Pneumovax 2311/11/2012.    Past Medical History:  Diagnosis Date   Achilles rupture, left    Actinic keratoses    Arthritis    Back pain    low   Colon polyp 2015   Controlled narcolepsy 08/29/2014   Diverticulitis    Dyspnea    on exertion   Fever blister    episodically   Generalized headaches    GERD (gastroesophageal reflux  disease)    Hepatitis A 1985   Hypersomnia    Knee injuries 2002   left   Migraine    Migraine headache with aura 08/29/2014   Narcolepsy without cataplexy 04/12/2013    MSLT with  MSL 4.4 minutes.    Pain    facial trigemonal   Rotator cuff (capsule) sprain 08/29/2014   Seborrhea    chronic   Sinusitis    Tendinitis    left middle finger     Family History  Problem Relation Age of Onset   Heart disease Father 7   Hypertension Father    Prostate cancer Father    Cancer Mother        breast,colon,skin   Heart disease Mother    CVA Maternal Grandmother    Microcephaly Maternal Grandmother    Microcephaly Paternal Grandfather      Past Surgical History:  Procedure Laterality Date   COLONOSCOPY  2011-last   every 5 years   FINGER SURGERY  2009/2010   trigger finger of left hand     HERNIA REPAIR  1963   right inguinal    LASIK  1998 or [place] RELEASE  08/31/2012   Procedure: MINOR RELEASE TRIGGER FINGER/A-1 PULLEY;  Surgeon: Cammie Sickle., MD;  Location: [facility];  Service: Orthopedics;  Laterality: Right;  right long finger   VASECTOMY      Social History   Socioeconomic History   Marital status: Married    Spouse name: Christopher Weber   Number of children: 2   Years of education: College   Highest education level: Not on file  Occupational History    Employer: [employer]  Social Needs   Financial resource strain: Not on file   Food insecurity    Worry: Not on file    Inability: Not on file   Transportation needs    Medical: Not on file    Non-medical: Not on file  Tobacco Use   Smoking status: Never Smoker   Smokeless tobacco: Never Used  Substance and Sexual Activity   Alcohol use: Yes    Alcohol/week: 7.0 standard drinks    Types: 7 Cans of beer per week    Comment: consumes one beer daily   Drug use: No   Sexual activity: Not  on file  Lifestyle   Physical activity    Days per week: Not on file    Minutes per session: Not on file   Stress: Not on file  Relationships   Social connections    Talks on phone: Not on file    Gets together: Not on file    Attends religious service: Not on file    Active member of club or organization: Not on file  Attends meetings of clubs or organizations: Not on file    Relationship status: Not on file   Intimate partner violence    Fear of current or ex partner: Not on file    Emotionally abused: Not on file    Physically abused: Not on file    Forced sexual activity: Not on file  Other Topics Concern   Not on file  Social History Narrative   Patient is married ([place]) and lives at home with his wife.   Patient has two adult children.   Patient is working full-time.   Patient has a Financial risk analyst, Production manager.   Patient is right-handed.   Patient drinks two cups of coffee in the morning.     Allergies  Allergen Reactions   Azithromycin Photosensitivity   Tetracyclines & Related     photosensitivity - can take medication but just must avoid being out in the sun    Doxycycline Photosensitivity   Dulcolax Stool Softener [Dss] Nausea And Vomiting     Immunization History  Administered Date(s) Administered   Hepatitis A, Adult 06/01/2018   Hepatitis A, Ped/Adol-2 Dose 09/18/2018   Influenza, High Dose Seasonal PF 05/25/2019   Influenza-Unspecified 05/25/2019   Typhoid Inactivated 06/01/2018   Zoster Recombinat (Shingrix) 11/10/2018, 03/02/2019    Outpatient Medications Prior to Visit  Medication Sig Dispense Refill   mometasone (NASONEX) 50 MCG/ACT nasal spray SHAKE LQ AND U 1 SPR IEN BID     Multiple Vitamin (MULTIVITAMIN) capsule Take 1 capsule by mouth daily.     multivitamin-lutein (OCUVITE-LUTEIN) CAPS capsule Take 1 capsule by mouth daily.     Omega-3 Fatty Acids (FISH OIL) 500 MG CAPS Take by mouth. As directed      Omeprazole-Sodium Bicarbonate (ZEGERID OTC PO)      SUMAtriptan (IMITREX) 100 MG tablet TAKE 1 TABLET BY MOUTH EVERY 2 HOURS AS NEEDED FOR HEADACHE. MAY REPEAT 1 TABLET IN 2 HOURS IF HEADACHE PERSISTS. 10 tablet 3   valACYclovir (VALTREX) 500 MG tablet Take 500 mg by mouth daily.      XYREM 500 MG/ML SOLN Take 2 grams of xyrem by mouth twice nightly. 270 mL 3   zaleplon (SONATA) 10 MG capsule TAKE 1 CAPSULE BY MOUTH EVERY DAY AT BEDTIME AS NEEDED FOR SLEEP 30 capsule 0   Famotidine (PEPCID PO) Take 1 tablet by mouth daily.     pantoprazole (PROTONIX) 40 MG tablet Take 40 mg by mouth daily.     No facility-administered medications prior to visit.     Review of Systems  Constitutional: Negative for chills, fever and weight loss.  HENT: Negative.   Respiratory: Negative for cough, sputum production and shortness of breath.   Cardiovascular: Negative.   Gastrointestinal: Negative for heartburn, nausea and vomiting.  Musculoskeletal: Negative.   Skin: Negative.      Objective:   Vitals:   07/19/19 1553  BP: 108/70  Pulse: 68  Temp: 98.1 F (36.7 C)  TempSrc: Oral  SpO2: 98%  Weight: 164 lb 3.2 oz (74.5 kg)  Height: 5\' 7"  (1.702 m)   98% on  RA BMI Readings from Last 3 Encounters:  07/19/19 25.72 kg/m  05/31/19 25.06 kg/m  05/31/19 25.28 kg/m   Wt Readings from Last 3 Encounters:  07/19/19 164 lb 3.2 oz (74.5 kg)  05/31/19 160 lb (72.6 kg)  05/31/19 161 lb 6.4 oz (73.2 kg)    Physical Exam Vitals signs reviewed.  Constitutional:      Appearance: Normal appearance. He is  not ill-appearing.  HENT:     Head: Normocephalic and atraumatic.     Nose:     Comments: Deferred due to masking requirement.    Mouth/Throat:     Comments: Deferred due to masking requirement. Eyes:     General: No scleral icterus. Neck:     Musculoskeletal: Neck supple.  Cardiovascular:     Rate and Rhythm: Normal rate and regular rhythm.     Heart sounds: No murmur.  Pulmonary:      Comments: Breathing comfortably on room air, CTAB. Abdominal:     General: Abdomen is flat. There is no distension.  Musculoskeletal:        General: No swelling or deformity.  Lymphadenopathy:     Cervical: No cervical adenopathy.  Skin:    General: Skin is warm and dry.     Findings: No rash.  Neurological:     General: No focal deficit present.     Mental Status: He is alert.     Motor: No weakness.     Coordination: Coordination normal.  Psychiatric:        Mood and Affect: Mood normal.        Behavior: Behavior normal.      CBC Recent results provided by the the patient from his primary care physician demonstrate no anemia.  CHEMISTRY Recent results provided by the patient from his primary care provider demonstrate normal BMP.  Chest Imaging- films reviewed: CXR 05/04/1999 20-2 views, thoracic scoliosis, otherwise normal chest x-ray.  High-resolution CT chest 06/28/2019-no architectural distortion or septal thickening.  No nodules, masses, or opacities.  No mosaicism or air-trapping on expiratory films.  4 mm nodule RLL.  Pulmonary Functions Testing Results: PFT Results Latest Ref Rng & Units 06/18/2019  FVC-Pre L 4.30  FVC-Predicted Pre % 109  FVC-Post L 4.13  FVC-Predicted Post % 105  Pre FEV1/FVC % % 75  Post FEV1/FCV % % 80  FEV1-Pre L 3.23  FEV1-Predicted Pre % 112  FEV1-Post L 3.30  DLCO UNC% % 122  DLCO COR %Predicted % 114  TLC L 6.47  TLC % Predicted % 100  RV % Predicted % 99  No significant obstruction or bronchodilator reversibility.  No significant restriction.  Increased diffusion capacity.  Normal flow volume loop.   Sent a note on 06/18/2019 to report results: Treadmill stress test-no EKG changes, pulse went up to 145.  Pretest pulse ox 99%, dropped to 88% during the test.  Had shortness of breath with this.     Assessment & Plan:     ICD-10-CM   1. Gastroesophageal reflux disease, unspecified whether esophagitis present  K21.9    GERD.   Previous dyspnea on exertion now resolved, was likely due to airway inflammation.  No evidence of obstruction or restriction on PFTs, no structural lung disease. -Continue current GERD regimen per GI; agree with EGD to evaluate for esophagitis  Up-to-date on seasonal flu vaccine  RTC as needed.        Current Outpatient Medications:    mometasone (NASONEX) 50 MCG/ACT nasal spray, SHAKE LQ AND U 1 SPR IEN BID, Disp: , Rfl:    Multiple Vitamin (MULTIVITAMIN) capsule, Take 1 capsule by mouth daily., Disp: , Rfl:    multivitamin-lutein (OCUVITE-LUTEIN) CAPS capsule, Take 1 capsule by mouth daily., Disp: , Rfl:    Omega-3 Fatty Acids (FISH OIL) 500 MG CAPS, Take by mouth. As directed, Disp: , Rfl:    Omeprazole-Sodium Bicarbonate (ZEGERID OTC PO), , Disp: , Rfl:  SUMAtriptan (IMITREX) 100 MG tablet, TAKE 1 TABLET BY MOUTH EVERY 2 HOURS AS NEEDED FOR HEADACHE. MAY REPEAT 1 TABLET IN 2 HOURS IF HEADACHE PERSISTS., Disp: 10 tablet, Rfl: 3   valACYclovir (VALTREX) 500 MG tablet, Take 500 mg by mouth daily. , Disp: , Rfl:    XYREM 500 MG/ML SOLN, Take 2 grams of xyrem by mouth twice nightly., Disp: 270 mL, Rfl: 3   zaleplon (SONATA) 10 MG capsule, TAKE 1 CAPSULE BY MOUTH EVERY DAY AT BEDTIME AS NEEDED FOR SLEEP, Disp: 30 capsule, Rfl: 0   Famotidine (PEPCID PO), Take 1 tablet by mouth daily., Disp: , Rfl:    pantoprazole (PROTONIX) 40 MG tablet, Take 40 mg by mouth daily., Disp: , Rfl:    Julian Hy, DO [facility] 07/19/2019 3:58 PM

## 2019-07-30 DIAGNOSIS — Z01812 Encounter for preprocedural laboratory examination: Secondary | ICD-10-CM | POA: Diagnosis not present

## 2019-07-30 DIAGNOSIS — Z20828 Contact with and (suspected) exposure to other viral communicable diseases: Secondary | ICD-10-CM | POA: Diagnosis not present

## 2019-08-03 DIAGNOSIS — Z86010 Personal history of colonic polyps: Secondary | ICD-10-CM | POA: Diagnosis not present

## 2019-08-03 DIAGNOSIS — K635 Polyp of colon: Secondary | ICD-10-CM | POA: Diagnosis not present

## 2019-08-03 DIAGNOSIS — K5730 Diverticulosis of large intestine without perforation or abscess without bleeding: Secondary | ICD-10-CM | POA: Diagnosis not present

## 2019-08-03 DIAGNOSIS — K6389 Other specified diseases of intestine: Secondary | ICD-10-CM | POA: Diagnosis not present

## 2019-08-03 DIAGNOSIS — D121 Benign neoplasm of appendix: Secondary | ICD-10-CM | POA: Diagnosis not present

## 2019-08-03 DIAGNOSIS — Z800 Family history of malignant neoplasm of digestive organs: Secondary | ICD-10-CM | POA: Diagnosis not present

## 2019-08-08 DIAGNOSIS — D121 Benign neoplasm of appendix: Secondary | ICD-10-CM | POA: Diagnosis not present

## 2019-08-08 DIAGNOSIS — K635 Polyp of colon: Secondary | ICD-10-CM | POA: Diagnosis not present

## 2019-08-16 DIAGNOSIS — H35371 Puckering of macula, right eye: Secondary | ICD-10-CM | POA: Diagnosis not present

## 2019-08-16 DIAGNOSIS — G43809 Other migraine, not intractable, without status migrainosus: Secondary | ICD-10-CM | POA: Diagnosis not present

## 2019-08-16 DIAGNOSIS — H43813 Vitreous degeneration, bilateral: Secondary | ICD-10-CM | POA: Diagnosis not present

## 2019-08-16 DIAGNOSIS — H2513 Age-related nuclear cataract, bilateral: Secondary | ICD-10-CM | POA: Diagnosis not present

## 2019-08-24 ENCOUNTER — Ambulatory Visit
Admission: RE | Admit: 2019-08-24 | Discharge: 2019-08-24 | Disposition: A | Discharge status: Home / Self Care | Payer: Medicare Other | Source: Ambulatory Visit | Attending: Internal Medicine | Admitting: Internal Medicine

## 2019-08-24 ENCOUNTER — Other Ambulatory Visit: Payer: Self-pay | Admitting: Internal Medicine

## 2019-08-24 DIAGNOSIS — M25571 Pain in right ankle and joints of right foot: Secondary | ICD-10-CM

## 2019-08-24 DIAGNOSIS — S9001XA Contusion of right ankle, initial encounter: Secondary | ICD-10-CM | POA: Diagnosis not present

## 2019-09-04 ENCOUNTER — Other Ambulatory Visit: Payer: Self-pay | Admitting: Neurology

## 2019-09-24 DIAGNOSIS — Z125 Encounter for screening for malignant neoplasm of prostate: Secondary | ICD-10-CM | POA: Diagnosis not present

## 2019-09-24 DIAGNOSIS — K219 Gastro-esophageal reflux disease without esophagitis: Secondary | ICD-10-CM | POA: Diagnosis not present

## 2019-09-24 DIAGNOSIS — G47429 Narcolepsy in conditions classified elsewhere without cataplexy: Secondary | ICD-10-CM | POA: Diagnosis not present

## 2019-09-24 DIAGNOSIS — Z0001 Encounter for general adult medical examination with abnormal findings: Secondary | ICD-10-CM | POA: Diagnosis not present

## 2019-09-24 DIAGNOSIS — R0600 Dyspnea, unspecified: Secondary | ICD-10-CM | POA: Diagnosis not present

## 2019-09-24 DIAGNOSIS — R002 Palpitations: Secondary | ICD-10-CM | POA: Diagnosis not present

## 2019-09-24 DIAGNOSIS — B009 Herpesviral infection, unspecified: Secondary | ICD-10-CM | POA: Diagnosis not present

## 2019-09-24 DIAGNOSIS — G43109 Migraine with aura, not intractable, without status migrainosus: Secondary | ICD-10-CM | POA: Diagnosis not present

## 2019-09-24 DIAGNOSIS — R634 Abnormal weight loss: Secondary | ICD-10-CM | POA: Diagnosis not present

## 2019-09-24 DIAGNOSIS — Z1389 Encounter for screening for other disorder: Secondary | ICD-10-CM | POA: Diagnosis not present

## 2019-09-24 DIAGNOSIS — E785 Hyperlipidemia, unspecified: Secondary | ICD-10-CM | POA: Diagnosis not present

## 2019-09-24 DIAGNOSIS — Z79899 Other long term (current) drug therapy: Secondary | ICD-10-CM | POA: Diagnosis not present

## 2019-09-25 DIAGNOSIS — Z23 Encounter for immunization: Secondary | ICD-10-CM | POA: Diagnosis not present

## 2019-10-03 ENCOUNTER — Other Ambulatory Visit: Payer: Self-pay | Admitting: Neurology

## 2019-10-03 DIAGNOSIS — G47411 Narcolepsy with cataplexy: Secondary | ICD-10-CM

## 2019-10-03 DIAGNOSIS — G47419 Narcolepsy without cataplexy: Secondary | ICD-10-CM

## 2019-10-03 DIAGNOSIS — G43109 Migraine with aura, not intractable, without status migrainosus: Secondary | ICD-10-CM

## 2019-10-03 MED ORDER — XYREM 500 MG/ML PO SOLN: ORAL | 5 refills | Status: DC

## 2019-10-16 DIAGNOSIS — Z23 Encounter for immunization: Secondary | ICD-10-CM | POA: Diagnosis not present

## 2019-10-19 DIAGNOSIS — Z800 Family history of malignant neoplasm of digestive organs: Secondary | ICD-10-CM | POA: Diagnosis not present

## 2019-10-19 DIAGNOSIS — G43809 Other migraine, not intractable, without status migrainosus: Secondary | ICD-10-CM | POA: Diagnosis not present

## 2019-10-19 DIAGNOSIS — K219 Gastro-esophageal reflux disease without esophagitis: Secondary | ICD-10-CM | POA: Diagnosis not present

## 2019-10-19 DIAGNOSIS — R0609 Other forms of dyspnea: Secondary | ICD-10-CM | POA: Diagnosis not present

## 2019-10-19 DIAGNOSIS — H2513 Age-related nuclear cataract, bilateral: Secondary | ICD-10-CM | POA: Diagnosis not present

## 2019-10-19 DIAGNOSIS — H43813 Vitreous degeneration, bilateral: Secondary | ICD-10-CM | POA: Diagnosis not present

## 2019-10-19 DIAGNOSIS — H35371 Puckering of macula, right eye: Secondary | ICD-10-CM | POA: Diagnosis not present

## 2019-10-19 DIAGNOSIS — Z86010 Personal history of colonic polyps: Secondary | ICD-10-CM | POA: Diagnosis not present

## 2019-11-08 DIAGNOSIS — B353 Tinea pedis: Secondary | ICD-10-CM | POA: Diagnosis not present

## 2019-11-08 DIAGNOSIS — L918 Other hypertrophic disorders of the skin: Secondary | ICD-10-CM | POA: Diagnosis not present

## 2019-11-08 DIAGNOSIS — L814 Other melanin hyperpigmentation: Secondary | ICD-10-CM | POA: Diagnosis not present

## 2019-11-08 DIAGNOSIS — L821 Other seborrheic keratosis: Secondary | ICD-10-CM | POA: Diagnosis not present

## 2019-11-08 DIAGNOSIS — D2261 Melanocytic nevi of right upper limb, including shoulder: Secondary | ICD-10-CM | POA: Diagnosis not present

## 2019-11-08 DIAGNOSIS — L820 Inflamed seborrheic keratosis: Secondary | ICD-10-CM | POA: Diagnosis not present

## 2019-11-08 DIAGNOSIS — L570 Actinic keratosis: Secondary | ICD-10-CM | POA: Diagnosis not present

## 2019-11-29 ENCOUNTER — Other Ambulatory Visit: Payer: Self-pay

## 2019-11-29 ENCOUNTER — Ambulatory Visit (INDEPENDENT_AMBULATORY_CARE_PROVIDER_SITE_OTHER): Payer: Medicare Other | Admitting: Neurology

## 2019-11-29 ENCOUNTER — Encounter: Payer: Self-pay | Admitting: Neurology

## 2019-11-29 VITALS — BP 108/68 | HR 62 | Temp 97.4°F | Ht 67.0 in | Wt 163.0 lb

## 2019-11-29 DIAGNOSIS — G43109 Migraine with aura, not intractable, without status migrainosus: Secondary | ICD-10-CM | POA: Diagnosis not present

## 2019-11-29 DIAGNOSIS — G47411 Narcolepsy with cataplexy: Secondary | ICD-10-CM | POA: Diagnosis not present

## 2019-11-29 DIAGNOSIS — G47419 Narcolepsy without cataplexy: Secondary | ICD-10-CM

## 2019-11-29 MED ORDER — ZALEPLON 10 MG PO CAPS: ORAL_CAPSULE | ORAL | 0 refills | Status: DC

## 2019-11-29 MED ORDER — XYREM 500 MG/ML PO SOLN: ORAL | 5 refills | Status: DC

## 2019-11-29 MED ORDER — SUMATRIPTAN SUCCINATE 100 MG PO TABS: 100.0000 mg | ORAL_TABLET | ORAL | 3 refills | Status: DC | PRN

## 2019-11-29 NOTE — Patient Instructions (Signed)
As discussed, Xyrem has to be taken with very mindful caution: Taking Xyrem correctly is key. This means, take it only when you are fully ready to fall asleep, while in bed and refrain from doing any other activities, even brushing  your teeth after taking your first dose. The second dose will be about 2-1/2-4 hours after his first dose. You can go to the bathroom before your 2nd dose. Take your first dose, when actually IN BED, ready to sleep. No sitting up in bed, NO reading, NO using the cell phone or computer, NO getting up to use the bathroom. Take care of everything BEFORE sleep time. Try NOT to skip the second dose as the Xyrem is not going to stay in your system long enough with only one dose. Do not drink alcohol with Xyrem. If you do drink Alcohol, you cannot take your Xyrem doses that night.    Sodium Oxybate oral solution What is this medicine? SODIUM OXYBATE (SOE dee um OX i bate) is used to treat excessive sleepiness and cataplexy in patients with narcolepsy. Cataplexy causes a sudden muscle weakness due to a strong emotional response. This medicine may be used for other purposes; ask your healthcare provider or pharmacist if you have questions. This medicine may be used for other purposes; ask your health care provider or pharmacist if you have questions. COMMON BRAND NAME(S): Xyrem What should I tell my health care provider before I take this medicine? They need to know if you have any of these conditions:  depression  diet low in salt  heart disease  high blood pressure  history of drug or alcohol abuse problem  if you drink alcohol  kidney disease  liver disease  lung or breathing disease, like sleep apnea  mental illness  succinic semialdehyde dehydrogenase deficiency  suicidal thoughts, plans, or attempt; a previous suicide attempt by you or a family member  an unusual or allergic reaction to oxybate, other medicines, foods, dyes, or preservatives  pregnant  or trying to get pregnant  breast-feeding How should I use this medicine? Take this medicine by mouth. Follow the directions on the prescription label. Use a specially marked oral syringe, spoon, or dropper to measure each dose. Ask your pharmacist if you do not have one. Household spoons are not accurate. Mix the dose with water as directed. Take this medicine on an empty stomach, at least 2 hours after food. Take your medicine at regular intervals. Do not take it more often than directed. Do not stop taking except on your doctor's advice. A special MedGuide will be given to you by the pharmacist with each prescription and refill. Be sure to read this information carefully each time. Talk to your pediatrician regarding the use of this medicine in children. While this drug may be prescribed for children as young as 7 years for selected conditions, precautions do apply. Overdosage: If you think you have taken too much of this medicine contact a poison control center or emergency room at once. NOTE: This medicine is only for you. Do not share this medicine with others. What if I miss a dose? If you miss a dose, skip it. Take your next dose at the normal time. Do not take extra or 2 doses at the same time to make up for the missed dose. What may interact with this medicine? Do not take this medicine with any of the following medications:  alcohol  medicines for sleep This medicine may also interact with the following  medications:  antihistamines for allergy, cough, and cold  certain medicines for depression, like amitriptyline, fluoxetine, sertraline  certain medicines for seizures like phenobarbital, primidone  divalproex sodium  general anesthetics like halothane, isoflurane, methoxyflurane, propofol  medicines that relax muscles for surgery  narcotic medicines for pain  phenothiazines like chlorpromazine, mesoridazine, prochlorperazine, thioridazine  valproate or valproic  acid This list may not describe all possible interactions. Give your health care provider a list of all the medicines, herbs, non-prescription drugs, or dietary supplements you use. Also tell them if you smoke, drink alcohol, or use illegal drugs. Some items may interact with your medicine. What should I watch for while using this medicine? Visit your doctor or health care professional for regular checks on your progress. Tell your healthcare professional if your symptoms do not start to get better or if they get worse. This medicine has a risk of abuse and dependence. Your health care provider will check you for this while you take this medicine. You may get drowsy or dizzy. This medicine causes sleep very quickly. You should only take your first dose at bedtime, while in bed. The second dose should be taken 2.5 to 4 hours after your first dose. Do not drive, use machinery, or do anything that needs mental alertness for at least 6 hours after taking this drug. Do not stand up or sit up quickly, especially if you are an older patient. This reduces the risk of dizzy or fainting spells. Alcohol may interfere with the effect of this medicine. Avoid alcoholic drinks. After taking this medicine, you may get up out of bed and do an activity that you do not know you are doing. The next morning, you may have no memory of this. Activities include driving a car ("sleep-driving"), making and eating food, talking on the phone, sexual activity, and sleep-walking. Serious injuries have occurred. Call your doctor right away if you find out you have done any of these activities. Do not take this medicine if you have used alcohol that evening. Do not take it if you have taken another medicine for sleep. The risk of doing these sleep-related activities is higher. If you or your family notice any changes in your behavior, such as new or worsening depression, thoughts of harming yourself, anxiety, other unusual or disturbing  thoughts, or memory loss, call your healthcare professional right away. What side effects may I notice from receiving this medicine? Side effects that you should report to your doctor or health care professional as soon as possible:  allergic reactions like skin rash, itching or hives, swelling of the face, lips, or tongue  anxiety  breathing problems  confusion  hallucinations  seizures  signs and symptoms of low blood pressure like dizziness; feeling faint or lightheaded, falls; unusually weak or tired  sleepwalking  suicidal thoughts, mood changes  vomiting Side effects that usually do not require medical attention (report to your doctor or health care professional if they continue or are bothersome):  bedwetting  dizziness  drowsiness  headache  loss of appetite  nausea This list may not describe all possible side effects. Call your doctor for medical advice about side effects. You may report side effects to FDA at 1-800-FDA-1088. Where should I keep my medicine? Keep out of the reach of children. This medicine can be abused. Keep your medicine in a safe place to protect it from theft. Do not share this medicine with anyone. Selling or giving away this medicine is dangerous and against the  law. Store at room temperature between 15 and 30 degrees C (59 and 86 degrees F). Keep this medicine in the original container. Throw away any unused medicine after the expiration date. This medicine may cause accidental overdose and death if it is taken by other adults, children, or pets. Flush any unused medicine down the toilet or empty down the sink drain to reduce the chance of harm. Do not use the medicine after the expiration date. After preparing a dose of this medicine with water, the medicine should be taken within 24 hours or emptied down the sink drain. NOTE: This sheet is a summary. It may not cover all possible information. If you have questions about this medicine, talk  to your doctor, pharmacist, or health care provider.  2020 Elsevier/Gold Standard (2019-04-11 11:21:52)

## 2019-11-29 NOTE — Progress Notes (Signed)
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  He reports last year he had breathing trouble, felt " upper airway congested" no lung problem. Had PFTs after feeling always congested and short of air-, he had to stop Ranitidine, (Zantac) upon PCP's pressure and amit research suggesting cancer side effect.  He changed to Pepcid and finally Prilosec at night. Christopher Weber -MD Dr. Cristina Weber dx him finally with LPR , Laryngo- Pharyngeal Reflux , and he breathes now very well. occasional migraine.  Strong visual distortions- central visual field loss, " the Picasso outlook"  Takes imitrex, needs 2 a month.   He also had a vitreous detachment - had one in the other eye while he was in [place]. Not a retinal detachment.  EDS- He is well controlled - on Xyrem - and that at a proud price of 8000 a month, weird dreams and some hypnagogic hallucinations have however still been present. Lots of REM pressure. He is on a very low dose, but is not willing to increase.  He still uses Sonata when travelling time zones- has not done so in 2020.    RV 05-31-2019,  Patient has returned from [place] in December, wife lost sense of smell and taste and still has not recovered. Had diarrhea, nausea and fever, myalgia, cough.  He did not get sick. Has not tested positive for Covid- no tests at the time.  Continues on his medications. Refills needed. Had recent labs with PCP.    UPDATE 11/23/2018 [name], 74 year old male returns for follow-up with history of narcolepsy no cataplexy.  He has been doing well he takes Xyrem twice nightly except maybe 1 night a week he does not get in the second dose.  ESS is 5 recent labs performed by primary care in January to include CBC  CMP hemoglobin A1c and lipid panel reported to be within normal limits.  I do not have access to that information.  Patient had right vitreous detachment while traveling in [place] in November.  He returns for reevaluation.  He also has Sonata to take when he is traveling on a as needed basis.  UPDATE 9/6/2019CM [name] , 74 year old male returns for follow-up with history of narcolepsy cataplexy.  He has been doing well.  He continues to take Xyrem at night sometimes he does not get in the second dose.  ESS score 7.  He has not had recent labs.  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 I have the pleasure of seeing Dr. [name] today on 02 September 2017 ina routine revisit to refill his XYREM.  He had recent labs drawn with his primary care physician on 14 December, Dr. Josetta Weber, and except for an elevated LDL and HDL-transaminases are normal, TSH was normal range, MPV is low at 6.7 ALP was low at 35, creatinine has always been elevated and remains at 1.4 with an estimated glomerular filtration rate of 50.   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 Xyrem has helped to achieve a higher quality of sleep, less  fragmented sleep. But the lower dose has not allowed him to skip a dose without feeling sleepy again. The patient also had noticed a decrease in headache frequency after he achieves better sleep. Since he has been treated with Valtrex for herpetic whitlow he noted further decrease in headache frequency and intensity and overall at the level of energy is well he feels cognitively brighter and he has not experienced vertigo in a while He  continues to use valtrex, has had a herpetic finger infection .     REVIEW OF SYSTEMS: Full 14 system review of systems performed and notable only for  those listed, all others are neg:  Sleep : Narcolepsy no cataplexy. Recent SOB and weight loss but no pulmonary abnormalities. Chest XRay.05-04-2019. SOB   LPReflux, had worseinig after Zantac stop.  Occasional migraine.  Strong visual distortions- central visual field loss, " the Picasso outlook"  Takes imitrex, needs 2 a month.  Valtrex      ALLERGIES: Allergies  Allergen Reactions  . Azithromycin Photosensitivity  . Tetracyclines & Related     photosensitivity - can take medication but just must avoid being out in the sun   . Doxycycline Photosensitivity  . Dulcolax Stool Softener [Dss] Nausea And Vomiting    HOME MEDICATIONS: Outpatient Medications Prior to Visit  Medication Sig Dispense Refill  . mometasone (NASONEX) 50 MCG/ACT nasal spray SHAKE LQ AND U 1 SPR IEN BID    . Multiple Vitamin (MULTIVITAMIN) capsule Take 1 capsule by mouth daily.    . multivitamin-lutein (OCUVITE-LUTEIN) CAPS capsule Take 1 capsule by mouth daily.    . Omega-3 Fatty Acids (FISH OIL) 500 MG CAPS Take by mouth. As directed    . Omeprazole-Sodium Bicarbonate (ZEGERID OTC PO) 1 tablet at bedtime.     . pantoprazole (PROTONIX) 40 MG tablet Take 40 mg by mouth daily.    . SUMAtriptan (IMITREX) 100 MG tablet TAKE 1 TABLET BY MOUTH EVERY 2 HOURS AS NEEDED FOR HEADACHE. MAY REPEAT 1 TABLET IN 2 HOURS IF HEADACHE PERSISTS. 10 tablet 3  . valACYclovir (VALTREX) 500 MG tablet Take 500 mg by mouth 3 (three) times a week. Mon, Wed Fri    . XYREM 500 MG/ML SOLN Take 2 grams of xyrem by mouth twice nightly. 270 mL 5  . zaleplon (SONATA) 10 MG capsule TAKE 1 CAPSULE BY MOUTH EVERY DAY AT BEDTIME AS NEEDED FOR SLEEP 30 capsule 0  . Famotidine (PEPCID PO) Take 1 tablet by mouth daily.     No facility-administered medications prior to visit.    PAST MEDICAL HISTORY: Past Medical History:  Diagnosis Date  . Achilles rupture, left   . Actinic keratoses   . Arthritis   . Back pain    low  . Colon polyp 2015    . Controlled narcolepsy 08/29/2014  . Diverticulitis   . Dyspnea    on exertion  . Fever blister    episodically  . Generalized headaches   . GERD (gastroesophageal reflux disease) Laryngo- Pharyngeal reflux-  Dr Christopher Weber, 2021.    Marland Kitchen Hepatitis A 1985  . Hypersomnia   . Knee injuries 2002   left  . Migraine   . Migraine headache with aura 08/29/2014  . Narcolepsy without cataplexy 04/12/2013    MSLT with  MSL 4.4 minutes.   . Pain    facial trigemonal  . Rotator cuff (capsule) sprain 08/29/2014  . Seborrhea    chronic  . Sinusitis   . Tendinitis    left  middle finger    PAST SURGICAL HISTORY: Past Surgical History:  Procedure Laterality Date  . COLONOSCOPY  2011-last   every 5 years  . FINGER SURGERY  2009/2010   trigger finger of left hand    . [place]   right inguinal   . LASIK  1998 or 99  . TONSILLECTOMY AND ADENOIDECTOMY  1960  . TRIGGER FINGER RELEASE  08/31/2012   Procedure: MINOR RELEASE TRIGGER FINGER/A-1 PULLEY;  Surgeon: Cammie Sickle., MD;  Location: [facility];  Service: Orthopedics;  Laterality: Right;  right long finger  . VASECTOMY      FAMILY HISTORY: Family History  Problem Relation Age of Onset  . Heart disease Father 73  . Hypertension Father   . Prostate cancer Father   . Cancer Mother        breast,colon,skin  . Heart disease Mother   . CVA Maternal Grandmother   . Microcephaly Maternal Grandmother   . Microcephaly Paternal Grandfather     SOCIAL HISTORY: Social History   Socioeconomic History  . Marital status: Married    Spouse name: [name]  . Number of children: 2  . Years of education: College  . Highest education level: Not on file  Occupational History    Employer: [employer]  Tobacco Use  . Smoking status: Never Smoker  . Smokeless tobacco: Never Used  Substance and Sexual Activity  . Alcohol use: Yes    Alcohol/week: 7.0 standard drinks    Types: 7 Cans of beer per week     Comment: consumes one beer daily  . Drug use: No  . Sexual activity: Not on file  Other Topics Concern  . Not on file  Social History Narrative   Patient is married [name]) and lives at home with his wife.   Patient has two adult children.   Patient is working full-time.   Patient has a Financial risk analyst, Production manager.   Patient is right-handed.   Patient drinks two cups of coffee in the morning.   Social Determinants of Health   Financial Resource Strain:   . Difficulty of Paying Living Expenses:   Food Insecurity:   . Worried About Charity fundraiser in the Last Year:   . Arboriculturist in the Last Year:   Transportation Needs:   . Film/video editor (Medical):   Marland Kitchen Lack of Transportation (Non-Medical):   Physical Activity:   . Days of Exercise per Week:   . Minutes of Exercise per Session:   Stress:   . Feeling of Stress :   Social Connections:   . Frequency of Communication with Friends and Family:   . Frequency of Social Gatherings with Friends and Family:   . Attends Religious Services:   . Active Member of Clubs or Organizations:   . Attends Archivist Meetings:   Marland Kitchen Marital Status:   Intimate Partner Violence:   . Fear of Current or Ex-Partner:   . Emotionally Abused:   Marland Kitchen Physically Abused:   . Sexually Abused:      PHYSICAL EXAM  Vitals:   11/29/19 1337  BP: 108/68  Pulse: 62  Temp: (!) 97.4 F (36.3 C)  Weight: 163 lb (73.9 kg)  Height: 5\' 7"  (1.702 m)   Body mass index is 25.53 kg/m.  Generalized: Well developed, in no acute distress  Head: normocephalic and atraumatic,. Oropharynx benign  Neck: Supple,   Musculoskeletal: No deformity  Skin no  rash or edema Neurological examination   Mentation: Alert oriented to time, place, history taking. Attention span and concentration appropriate. Recent and remote memory intact.  Follows all commands speech and language fluent.   Cranial nerve : No change in taste and smell, Pupils  were equal round reactive to light extraocular movements were full, visual field were full on confrontational test. Facial sensation and strength were normal. hearing was intact to finger rubbing bilaterally. Uvula tongue midline. head turning and shoulder shrug were normal and symmetric.Tongue protrusion into cheek strength was normal. Motor: normal bulk and tone, full strength in the BUE, BLE,  Sensory: normal and symmetric to light touch,  Coordination: finger-nose-finger, heel-to-shin bilaterally, no dysmetria Gait and Station: Rising up from seated position without assistance, normal stance,  moderate stride, good arm swing, smooth turning, able to perform tiptoe, and heel walking without difficulty. Tandem gait is steady  DIAGNOSTIC DATA (LABS, IMAGING, TESTING)   fully Covid vaccinated. January 10-25.  - I reviewed patient records, labs, notes, testing and imaging myself where available. How likely are you to doze in the following situations: 0 = not likely, 1 = slight chance, 2 = moderate chance, 3 = high chance  Sitting and Reading? Watching Television? Sitting inactive in a public place (theater or meeting)? Lying down in the afternoon when circumstances permit? Sitting and talking to someone? Sitting quietly after lunch without alcohol? In a car, while stopped for a few minutes in traffic? As a passenger in a car for an hour without a break?  Total = 6/ 24     occasional migraine.  Strong visual distortions- central visual field loss, " the Picasso outlook"  Takes imitrex, needs 2 a month.        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  without cataplexy on low dose Xyrem doing well.   Migraine with strong visual aura, more auras than headaches. Has visual distortions and central blind spot.  Vertigo and headaches improved.    [employer] as needed when traveling overseas  He has been diagnosed with LP reflux- and improved on Prilosec .   Chronic GFR - reduction.  I like to repeat CMET, CBC for Xyrem follow up.    PS : Wife lost smell and taste 08-2018 in [place], he has been tested for Covid four times all negative, now vaccinated.   Plan:   Continue Xyrem at 2.0  gram twice nightly.   Does not need refills, has no HTN and no sodium burden and will not change to [facility].  Recent labs in January 2019 to include CBC/ CMP/ hemoglobin A1c and lipids reported as normal at primary care Continue Sonata as needed Follow up 6 months with me or NP.    [facility] [address], [place], [place] [address] [phone]  [name] PATIENT: Christopher Weber DOB: 1946-06-12   REASON FOR VISIT: Follow-up for narcolepsy HISTORY FROM: Patient    HISTORY OF PRESENT ILLNESS:UPDATE 9/6/2019CM [name] , 74 year old male returns for follow-up with history of narcolepsy cataplexy.  He has been doing well.  He continues to take Xyrem at night sometimes he does not get in the second dose.  ESS score 7.  He has not had recent labs.  He returns for reevaluation with no new neurologic complaints   Dr.  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  I have the pleasure of seeing Dr. [name] today on 02 September 2017 ina routine revisit to  refill his XYREM.  He had recent labs drawn with his primary care physician on 14 December, Dr. Josetta Weber, and except for an elevated LDL and HDL-transaminases are normal, TSH was normal range, MPV is low at 6.7 ALP was low at 35, creatinine has always been elevated and remains at 1.4 with an estimated glomerular filtration rate of 50.    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 Xyrem has helped to achieve a higher quality of sleep, less fragmented sleep. But the lower dose has not allowed him to skip a dose without feeling sleepy again. The patient also had noticed a decrease in headache frequency after he achieves better sleep. Since he has been treated with Valtrex for herpetic whitlow he noted further decrease in headache frequency and intensity and overall at the level of energy is well he feels cognitively brighter and he has not experienced vertigo in a while He  continues to use valtrex, has had a herpetic finger infection .     REVIEW OF SYSTEMS: Full 14 system review of systems performed and notable only for those listed, all others are neg:  Constitutional: neg  Cardiovascular: neg Ear/Nose/Throat: neg  Skin: neg Eyes: neg Respiratory: neg Gastroitestinal: neg  Hematology/Lymphatic: neg  Endocrine: neg Musculoskeletal:neg Allergy/Immunology: neg Neurological: neg Psychiatric: neg Sleep : Narcolepsy no cataplexy   ALLERGIES: Allergies  Allergen Reactions  . Azithromycin Photosensitivity  . Tetracyclines & Related     photosensitivity - can take medication but just must avoid being out in the sun   . Doxycycline Photosensitivity  . Dulcolax Stool Softener [Dss] Nausea And Vomiting    HOME MEDICATIONS: Outpatient Medications Prior to Visit  Medication Sig Dispense Refill  . mometasone (NASONEX) 50 MCG/ACT nasal spray SHAKE LQ AND U 1 SPR IEN BID    . Multiple Vitamin (MULTIVITAMIN) capsule Take 1  capsule by mouth daily.    . multivitamin-lutein (OCUVITE-LUTEIN) CAPS capsule Take 1 capsule by mouth daily.    . Omega-3 Fatty Acids (FISH OIL) 500 MG CAPS Take by mouth. As directed    . Omeprazole-Sodium Bicarbonate (ZEGERID OTC PO) 1 tablet at bedtime.     . pantoprazole (PROTONIX) 40 MG tablet Take 40 mg by mouth daily.    . SUMAtriptan (IMITREX) 100 MG tablet TAKE 1 TABLET BY MOUTH EVERY 2 HOURS AS NEEDED FOR HEADACHE. MAY REPEAT 1 TABLET IN 2 HOURS IF HEADACHE PERSISTS. 10 tablet 3  . valACYclovir (VALTREX) 500 MG tablet Take 500 mg by mouth 3 (three) times a week. Mon, Wed Fri    . XYREM 500 MG/ML SOLN Take 2 grams of xyrem by mouth twice nightly. 270 mL 5  .  zaleplon (SONATA) 10 MG capsule TAKE 1 CAPSULE BY MOUTH EVERY DAY AT BEDTIME AS NEEDED FOR SLEEP 30 capsule 0  . Famotidine (PEPCID PO) Take 1 tablet by mouth daily.     No facility-administered medications prior to visit.    PAST MEDICAL HISTORY: Past Medical History:  Diagnosis Date  . Achilles rupture, left   . Actinic keratoses   . Arthritis   . Back pain    low  . Colon polyp 2015  . Controlled narcolepsy 08/29/2014  . Diverticulitis   . Dyspnea    on exertion  . Fever blister    episodically  . Generalized headaches   . GERD (gastroesophageal reflux disease)   . Hepatitis A 1985  . Hypersomnia   . Knee injuries 2002   left  . Migraine   . Migraine headache with aura 08/29/2014  . Narcolepsy without cataplexy 04/12/2013    MSLT with  MSL 4.4 minutes.   . Pain    facial trigemonal  . Rotator cuff (capsule) sprain 08/29/2014  . Seborrhea    chronic  . Sinusitis   . Tendinitis    left middle finger    PAST SURGICAL HISTORY: Past Surgical History:  Procedure Laterality Date  . COLONOSCOPY  2011-last   every 5 years  . FINGER SURGERY  2009/2010   trigger finger of left hand    . [place]   right inguinal   . LASIK  1998 or 99  . TONSILLECTOMY AND ADENOIDECTOMY  1960  . TRIGGER FINGER  RELEASE  08/31/2012   Procedure: MINOR RELEASE TRIGGER FINGER/A-1 PULLEY;  Surgeon: Cammie Sickle., MD;  Location: [facility];  Service: Orthopedics;  Laterality: Right;  right long finger  . VASECTOMY      FAMILY HISTORY: Family History  Problem Relation Age of Onset  . Heart disease Father 66  . Hypertension Father   . Prostate cancer Father   . Cancer Mother        breast,colon,skin  . Heart disease Mother   . CVA Maternal Grandmother   . Microcephaly Maternal Grandmother   . Microcephaly Paternal Grandfather     SOCIAL HISTORY: Social History   Socioeconomic History  . Marital status: Married    Spouse name: [name]  . Number of children: 2  . Years of education: College  . Highest education level: Not on file  Occupational History    Employer: [employer]  Tobacco Use  . Smoking status: Never Smoker  . Smokeless tobacco: Never Used  Substance and Sexual Activity  . Alcohol use: Yes    Alcohol/week: 7.0 standard drinks    Types: 7 Cans of beer per week    Comment: consumes one beer daily  . Drug use: No  . Sexual activity: Not on file  Other Topics Concern  . Not on file  Social History Narrative   Patient is married [name]) and lives at home with his wife.   Patient has two adult children.   Patient is working full-time.   Patient has a Financial risk analyst, Production manager.   Patient is right-handed.   Patient drinks two cups of coffee in the morning.   Social Determinants of Health   Financial Resource Strain:   . Difficulty of Paying Living Expenses:   Food Insecurity:   . Worried About Charity fundraiser in the Last Year:   . Arboriculturist in the Last Year:   Transportation Needs:   .  Lack of Transportation (Medical):   Marland Kitchen Lack of Transportation (Non-Medical):   Physical Activity:   . Days of Exercise per Week:   . Minutes of Exercise per Session:   Stress:   . Feeling of Stress :   Social Connections:   . Frequency  of Communication with Friends and Family:   . Frequency of Social Gatherings with Friends and Family:   . Attends Religious Services:   . Active Member of Clubs or Organizations:   . Attends Archivist Meetings:   Marland Kitchen Marital Status:   Intimate Partner Violence:   . Fear of Current or Ex-Partner:   . Emotionally Abused:   Marland Kitchen Physically Abused:   . Sexually Abused:      PHYSICAL EXAM  Vitals:   11/29/19 1337  BP: 108/68  Pulse: 62  Temp: (!) 97.4 F (36.3 C)  Weight: 163 lb (73.9 kg)  Height: 5\' 7"  (1.702 m)   Body mass index is 25.53 kg/m.  Generalized: Well developed, in no acute distress  Head: normocephalic and atraumatic,. Oropharynx benign  Neck: Supple,   Cardiac: Regular rate rhythm, no murmur  Musculoskeletal: No deformity   Neurological examination   Mentation: Alert oriented to time, place, history taking. Attention span and concentration appropriate. Recent and remote memory intact.  Follows all commands speech and language fluent.   Cranial nerve : no loss of taste or smell. .Pupils were equal round reactive to light extraocular movements were full, visual field were full on confrontational test. Facial sensation and strength were normal. hearing was intact to finger rubbing bilaterally.  Uvula tongue midline. head turning and shoulder shrug were normal and symmetric.Tongue protrusion into cheek strength was normal. Motor: normal bulk and tone, full strength in the BUE, BLE,  Sensory: normal and symmetric to light touch,  Coordination: finger-nose-finger, heel-to-shin bilaterally, no dysmetria Gait and Station: Rising up from seated position without assistance, normal stance,  moderate stride, good arm swing, smooth turning, able to perform tiptoe, and heel walking without difficulty. Tandem gait is steady  DIAGNOSTIC DATA (LABS, IMAGING, TESTING) - I reviewed patient records, labs, notes, testing and imaging myself where available.  Labs from dr  gates quoted, scanned into media.     Component Value Date/Time   NA 137 05/19/2018 1107   K 5.4 (H) 05/19/2018 1107   CL 101 05/19/2018 1107   CO2 23 05/19/2018 1107   GLUCOSE 99 05/19/2018 1107   BUN 18 05/19/2018 1107   CREATININE 1.55 (H) 05/19/2018 1107   CALCIUM 10.0 05/19/2018 1107   PROT 7.0 02/10/2017 1631   ALBUMIN 4.7 02/10/2017 1631   AST 32 02/10/2017 1631   ALT 25 02/10/2017 1631   ALKPHOS 40 02/10/2017 1631   BILITOT 0.7 02/10/2017 1631   GFRNONAA 45 (L) 05/19/2018 1107   GFRAA 52 (L) 05/19/2018 1107   ASSESSMENT AND PLAN  74 y.o. year old male with narcolepsy without cataplexy on Xyrem doing well.  Vertigo and headaches improved.  [employer] as needed when traveling.  Narcolepsy is the diagnosis, without cataplexy. Xyrem is not interfering with Prilosec.   On XYREM and Valtrex. Vertigo and headaches remain improved.    Plan:   Continue Xyrem at 2.0  gram twice nightly.   Continue Sonata as needed Labs were just done at PCP Follow up every 6 months  [name], MD   [facility] [address] [place], [place] [address] [phone]

## 2019-12-18 ENCOUNTER — Other Ambulatory Visit: Payer: Self-pay | Admitting: Neurology

## 2019-12-18 MED ORDER — SUMATRIPTAN SUCCINATE 100 MG PO TABS: 100.0000 mg | ORAL_TABLET | ORAL | 3 refills | Status: DC | PRN

## 2019-12-18 NOTE — Telephone Encounter (Signed)
Received a letter for the patient from [facility] stating that it will not provide over 18 tablet per 30 days. The script is only written for 10 tablets so unsure what problem is, called the number on the letter at [phone] informing that the pt is only taking 10 per 30. I spoke with Levada Dy and she asked for another script to be sent to pharmacy indicating that was the case. I have completed that.  The member ID that she had for the patient was [id number].

## 2020-01-09 ENCOUNTER — Telehealth: Payer: Self-pay | Admitting: Neurology

## 2020-01-09 NOTE — Telephone Encounter (Signed)
PA completed for the pt through cover my meds/ [employer] [place]. [id number] Will wait for a response. Can take up to 3 days

## 2020-01-11 NOTE — Telephone Encounter (Signed)
Leana @[employer]  called re: the approval for pt's XYREM 500 MG/ML SOLN Effective dates:01-09-20 to 01-08-21  Huey Romans can be called at [phone] option 5 if needed

## 2020-03-12 ENCOUNTER — Other Ambulatory Visit: Payer: Self-pay | Admitting: Neurology

## 2020-03-12 DIAGNOSIS — G47419 Narcolepsy without cataplexy: Secondary | ICD-10-CM

## 2020-03-12 DIAGNOSIS — G43109 Migraine with aura, not intractable, without status migrainosus: Secondary | ICD-10-CM

## 2020-03-12 DIAGNOSIS — G47411 Narcolepsy with cataplexy: Secondary | ICD-10-CM

## 2020-03-12 MED ORDER — XYREM 500 MG/ML PO SOLN: ORAL | 5 refills | Status: DC

## 2020-03-28 DIAGNOSIS — M1990 Unspecified osteoarthritis, unspecified site: Secondary | ICD-10-CM | POA: Diagnosis not present

## 2020-03-28 DIAGNOSIS — E785 Hyperlipidemia, unspecified: Secondary | ICD-10-CM | POA: Diagnosis not present

## 2020-04-18 DIAGNOSIS — G43809 Other migraine, not intractable, without status migrainosus: Secondary | ICD-10-CM | POA: Diagnosis not present

## 2020-04-18 DIAGNOSIS — H43813 Vitreous degeneration, bilateral: Secondary | ICD-10-CM | POA: Diagnosis not present

## 2020-04-18 DIAGNOSIS — H2513 Age-related nuclear cataract, bilateral: Secondary | ICD-10-CM | POA: Diagnosis not present

## 2020-04-18 DIAGNOSIS — H35371 Puckering of macula, right eye: Secondary | ICD-10-CM | POA: Diagnosis not present

## 2020-04-25 DIAGNOSIS — Z20822 Contact with and (suspected) exposure to covid-19: Secondary | ICD-10-CM | POA: Diagnosis not present

## 2020-04-25 DIAGNOSIS — Z03818 Encounter for observation for suspected exposure to other biological agents ruled out: Secondary | ICD-10-CM | POA: Diagnosis not present

## 2020-06-05 ENCOUNTER — Other Ambulatory Visit: Payer: Self-pay

## 2020-06-05 ENCOUNTER — Encounter: Payer: Self-pay | Admitting: Neurology

## 2020-06-05 ENCOUNTER — Ambulatory Visit (INDEPENDENT_AMBULATORY_CARE_PROVIDER_SITE_OTHER): Payer: Medicare Other | Admitting: Neurology

## 2020-06-05 VITALS — BP 104/68 | HR 63 | Ht 68.0 in | Wt 160.0 lb

## 2020-06-05 DIAGNOSIS — L820 Inflamed seborrheic keratosis: Secondary | ICD-10-CM | POA: Diagnosis not present

## 2020-06-05 DIAGNOSIS — L570 Actinic keratosis: Secondary | ICD-10-CM | POA: Diagnosis not present

## 2020-06-05 DIAGNOSIS — G47411 Narcolepsy with cataplexy: Secondary | ICD-10-CM

## 2020-06-05 DIAGNOSIS — G47429 Narcolepsy in conditions classified elsewhere without cataplexy: Secondary | ICD-10-CM

## 2020-06-05 DIAGNOSIS — G43109 Migraine with aura, not intractable, without status migrainosus: Secondary | ICD-10-CM

## 2020-06-05 DIAGNOSIS — Z79899 Other long term (current) drug therapy: Secondary | ICD-10-CM

## 2020-06-05 DIAGNOSIS — G47419 Narcolepsy without cataplexy: Secondary | ICD-10-CM | POA: Diagnosis not present

## 2020-06-05 MED ORDER — SUMATRIPTAN SUCCINATE 100 MG PO TABS: 100.0000 mg | ORAL_TABLET | ORAL | 3 refills | Status: DC | PRN

## 2020-06-05 MED ORDER — XYREM 500 MG/ML PO SOLN: ORAL | 5 refills | Status: DC

## 2020-06-05 MED ORDER — ZALEPLON 10 MG PO CAPS: ORAL_CAPSULE | ORAL | 0 refills | Status: DC

## 2020-06-05 NOTE — Patient Instructions (Signed)
Calcium, Magnesium, Potassium, Sodium Oxybates oral solution What is this medicine? CALCIUM, MAGNESIUM, POTASSIUM, SODIUM OXYBATES (KAL see um, mag NEE zee um, poe TASS i um, SOE dee um OX i bates) is used to treat excessive sleepiness and cataplexy in patients with narcolepsy. Cataplexy causes a sudden muscle weakness due to a strong emotional response. This medicine may be used for other purposes; ask your health care provider or pharmacist if you have questions. COMMON BRAND NAME(S): Donney Rankins What should I tell my health care provider before I take this medicine? They need to know if you have any of these conditions:  depression  history of drug or alcohol abuse problem  if you drink alcohol  liver disease  lung or breathing disease, like sleep apnea  mental illness  succinic semialdehyde dehydrogenase deficiency  suicidal thoughts, plans, or attempt; a previous suicide attempt by you or a family member  an unusual or allergic reaction to oxybate, other medicines, foods, dyes, or preservatives  pregnant or trying to get pregnant  breast-feeding How should I use this medicine? Take this medicine by mouth. Follow the directions on the prescription label. Use a specially marked oral syringe, spoon, or dropper to measure each dose. Ask your pharmacist if you do not have one. Household spoons are not accurate. Mix the dose with water as directed. Take this medicine on an empty stomach, or at least 2 hours after food. Take your medicine at regular intervals. Do not take it more often than directed. Do not stop taking except on your doctor's advice. A special MedGuide will be given to you by the pharmacist with each prescription and refill. Be sure to read this information carefully each time. Talk to your pediatrician regarding the use of this medicine in children. While this drug may be prescribed for children as young as 7 years for selected conditions, precautions do apply. Overdosage:  If you think you have taken too much of this medicine contact a poison control center or emergency room at once. NOTE: This medicine is only for you. Do not share this medicine with others. What if I miss a dose? If you miss a dose, skip it. Take your next dose at the normal time. Do not take extra or 2 doses at the same time to make up for the missed dose. What may interact with this medicine? Do not take this medicine with any of the following medications:  alcohol  medicines for sleep This medicine may also interact with the following medications:  antihistamines for allergy, cough, and cold  certain medicines for depression, like amitriptyline, fluoxetine, sertraline  certain medicines for seizures like phenobarbital, primidone  divalproex sodium  general anesthetics like halothane, isoflurane, methoxyflurane, propofol  medicines that relax muscles for surgery  narcotic medicines for pain  phenothiazines like chlorpromazine, mesoridazine, prochlorperazine, thioridazine  valproate or valproic acid This list may not describe all possible interactions. Give your health care provider a list of all the medicines, herbs, non-prescription drugs, or dietary supplements you use. Also tell them if you smoke, drink alcohol, or use illegal drugs. Some items may interact with your medicine. What should I watch for while using this medicine? Visit your doctor or health care professional for regular checks on your progress. Tell your healthcare professional if your symptoms do not start to get better or if they get worse. This medicine has a risk of abuse and dependence. Your health care provider will check you for this while you take this medicine. You may  get drowsy or dizzy. This medicine causes sleep very quickly. You should only take your first dose at bedtime, while in bed. The second dose should be taken 2.5 to 4 hours after your first dose. Do not drive, use machinery, or do anything  that needs mental alertness for at least 6 hours after taking this drug. Do not stand up or sit up quickly, especially if you are an older patient. This reduces the risk of dizzy or fainting spells. Alcohol may interfere with the effect of this medicine. Avoid alcoholic drinks. After taking this medicine, you may get up out of bed and do an activity that you do not know you are doing. The next morning, you may have no memory of this. Activities include driving a car ("sleep-driving"), making and eating food, talking on the phone, sexual activity, and sleep-walking. Serious injuries have occurred. Call your doctor right away if you find out you have done any of these activities. Do not take this medicine if you have used alcohol that evening. Do not take it if you have taken another medicine for sleep. The risk of doing these sleep-related activities is higher. If you or your family notice any changes in your behavior, such as new or worsening depression, thoughts of harming yourself, anxiety, other unusual or disturbing thoughts, or memory loss, call your healthcare professional right away. What side effects may I notice from receiving this medicine? Side effects that you should report to your doctor or health care professional as soon as possible:  allergic reactions like skin rash, itching or hives, swelling of the face, lips, or tongue  anxiety  breathing problems  confusion  hallucinations  seizures  signs and symptoms of low blood pressure like dizziness; feeling faint or lightheaded, falls; unusually weak or tired  sleepwalking  suicidal thoughts, mood changes  vomiting Side effects that usually do not require medical attention (report these to your doctor or health care professional if they continue or are bothersome):  bedwetting  dizziness  drowsiness  headache  loss of appetite  nausea This list may not describe all possible side effects. Call your doctor for medical  advice about side effects. You may report side effects to FDA at 1-800-FDA-1088. Where should I keep my medicine? Keep out of the reach of children. This medicine can be abused. Keep your medicine in a safe place to protect it from theft. Do not share this medicine with anyone. Selling or giving away this medicine is dangerous and against the law. Store at room temperature between 15 and 30 degrees C (59 and 86 degrees F). Keep this medicine in the original container. Throw away any unused medicine after the expiration date. This medicine may cause accidental overdose and death if it is taken by other adults, children, or pets. Flush any unused medicine down the toilet or empty down the sink drain to reduce the chance of harm. Do not use the medicine after the expiration date. After preparing a dose of this medicine with water, the medicine should be taken within 24 hours or emptied down the sink drain. NOTE: This sheet is a summary. It may not cover all possible information. If you have questions about this medicine, talk to your doctor, pharmacist, or health care provider.  2020 Elsevier/Gold Standard (2019-04-14 15:40:37)

## 2020-06-05 NOTE — Progress Notes (Addendum)
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  No physical symptoms-he reports that he has not tried to lose weight but over the last 3 years he has lost about 10 pounds slowly, progressively.  He had suffered from laryngeal pharyngeal reflux and lost most of the weight during the pandemic he had the first symptoms by March 2020- after a switch from Zantac ( recall )  to [facility] in January 2020. Has still some headaches , 2 a month - with visual aura.     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  He reports last year he had breathing trouble, felt " upper airway congested" no lung problem. Had PFTs after feeling always congested and short of air-, he had to stop Ranitidine, (Zantac) upon PCP's pressure and amit research suggesting cancer side effect.  He changed to Pepcid and finally Prilosec at night. Christopher Weber -MD Christopher Weber dx him finally with LPR , Laryngo- Pharyngeal Reflux , and he breathes now very well. occasional migraine.  Strong visual distortions- central visual field loss, " the Picasso outlook"  Takes imitrex, needs 2 a month.   He also had a vitreous detachment - had one in the other eye while he was in [place]. Not a retinal detachment.  EDS- He is well controlled - on Xyrem - and that at a proud price of 8000 a month, weird dreams and some hypnagogic hallucinations have however still been present. Lots of REM pressure. He is on a very low dose, but is not willing to increase.  He still uses Sonata when travelling time zones- has not done so in 2020.    RV 05-31-2019,  Patient has returned from [place] in December, wife lost sense of smell and  taste and still has not recovered. Had diarrhea, nausea and fever, myalgia, cough.  He did not get sick. Has not tested positive for Covid- no tests at the time.  Continues on his medications. Refills needed. Had recent labs with PCP.    UPDATE 11/23/2018 Christopher Weber, 74 year old male returns for follow-up with history of narcolepsy no cataplexy.  He has been doing well he takes Xyrem twice nightly except maybe 1 night a week he does not get in the second dose.  ESS is 5 recent labs performed by primary care in January to include CBC CMP hemoglobin A1c and lipid panel reported to be within normal limits.  I do not have access to that information.  Patient had right vitreous detachment while traveling in [place] in November.  He returns for reevaluation.  He also has Sonata to take when he is traveling on a as needed basis.  UPDATE 9/6/2019CM Christopher Weber , 74 year old male returns for follow-up with history of narcolepsy cataplexy.  He has been doing well.  He continues to take Xyrem at night sometimes he does not get in the second dose.  ESS score 7.  He has not had recent labs.  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 I have the pleasure of seeing Christopher. Christopher Weber  today on 02 September 2017 ina routine revisit to refill his XYREM.  He had recent labs drawn with his primary care physician on 14 December, Christopher Weber, and except for an elevated LDL and HDL-transaminases are normal, TSH was normal range, MPV is low at 6.7 ALP was low at 35, creatinine has always been elevated and remains at 1.4 with an estimated glomerular filtration rate of 50.   02/10/2017 Christopher Weber is doing well, He continues to take 2 doses of Xyrem at nighttime but at a low dose. Xyrem has helped to achieve a higher quality of sleep, less fragmented sleep. But the lower dose has not allowed  him to skip a dose without feeling sleepy again. The patient also had noticed a decrease in headache frequency after he achieves better sleep. Since he has been treated with Valtrex for herpetic whitlow he noted further decrease in headache frequency and intensity and overall at the level of energy is well he feels cognitively brighter and he has not experienced vertigo in a while He  continues to use valtrex, has had a herpetic finger infection .     REVIEW OF SYSTEMS: Full 14 system review of systems performed and notable only for those listed, all others are neg:  Sleep : Narcolepsy without cataplexy. Type 2.  Recent SOB and weight loss but no pulmonary abnormalities.  Chest XRay.05-04-2019. SOB   LPReflux, had developed after Zantac stop.  Occasional migraine.  Strong visual distortions- central visual field loss, " the Picasso outlook"  Takes imitrex, needs 2 a month.  Valtrex      ALLERGIES: Allergies  Allergen Reactions  . Azithromycin Photosensitivity  . Tetracyclines & Related     photosensitivity - can take medication but just must avoid being out in the sun   . Doxycycline Photosensitivity  . Dulcolax Stool Softener [Dss] Nausea And Vomiting    HOME MEDICATIONS: Outpatient Medications Prior to Visit  Medication Sig Dispense Refill  . mometasone (NASONEX) 50 MCG/ACT nasal spray SHAKE LQ AND U 1 SPR IEN BID    . Multiple Vitamin (MULTIVITAMIN) capsule Take 1 capsule by mouth daily.    . multivitamin-lutein (OCUVITE-LUTEIN) CAPS capsule Take 1 capsule by mouth daily.    . Omega-3 Fatty Acids (FISH OIL) 500 MG CAPS Take by mouth. As directed    . Omeprazole-Sodium Bicarbonate (ZEGERID OTC PO) 1 tablet at bedtime.     . pantoprazole (PROTONIX) 20 MG tablet Take 20 mg by mouth daily.     . SUMAtriptan (IMITREX) 100 MG tablet Take 1 tablet (100 mg total) by mouth every 2 (two) hours as needed for migraine. May repeat in 2 hours if headache persists or recurs. (This is 30 day  supply) 10 tablet 3  . valACYclovir (VALTREX) 500 MG tablet Take 500 mg by mouth 3 (three) times a week. Mon, Wed Fri    . XYREM 500 MG/ML SOLN Take 2 grams of xyrem by mouth twice nightly. 270 mL 5  . zaleplon (SONATA) 10 MG capsule TAKE 1 CAPSULE BY MOUTH EVERY DAY AT BEDTIME AS NEEDED FOR SLEEP 30 capsule 0   No facility-administered medications prior to visit.    PAST MEDICAL HISTORY: Past Medical History:  Diagnosis Date  . Achilles rupture, left   . Actinic keratoses   . Arthritis   . Back pain    low  . Colon polyp 2015  . Controlled narcolepsy 08/29/2014  . Diverticulitis   . Dyspnea    on exertion  .  Fever blister    episodically  . Generalized headaches   . GERD (gastroesophageal reflux disease) Laryngo- Pharyngeal reflux-  Christopher Cristina Weber, 2021.    Marland Kitchen Hepatitis A 1985  . Hypersomnia   . Knee injuries 2002   left  . Migraine   . Migraine headache with aura 08/29/2014  . Narcolepsy without cataplexy 04/12/2013    MSLT with  MSL 4.4 minutes.   . Pain    facial trigemonal  . Rotator cuff (capsule) sprain 08/29/2014  . Seborrhea    chronic  . Sinusitis   . Tendinitis    left middle finger    PAST SURGICAL HISTORY: Past Surgical History:  Procedure Laterality Date  . COLONOSCOPY  2011-last   every 5 years  . FINGER SURGERY  2009/2010   trigger finger of left hand    . [place]   right inguinal   . LASIK  1998 or 99  . TONSILLECTOMY AND ADENOIDECTOMY  1960  . TRIGGER FINGER RELEASE  08/31/2012   Procedure: MINOR RELEASE TRIGGER FINGER/A-1 PULLEY;  Surgeon: Cammie Sickle., MD;  Location: [facility];  Service: Orthopedics;  Laterality: Right;  right long finger  . VASECTOMY      FAMILY HISTORY: Family History  Problem Relation Age of Onset  . Heart disease Father 47  . Hypertension Father   . Prostate cancer Father   . Cancer Mother        breast,colon,skin  . Heart disease Mother   . CVA Maternal Grandmother   .  Microcephaly Maternal Grandmother   . Microcephaly Paternal Grandfather     SOCIAL HISTORY: Social History   Socioeconomic History  . Marital status: Married    Spouse name: [name]  . Number of children: 2  . Years of education: College  . Highest education level: Not on file  Occupational History    Employer: [employer]  Tobacco Use  . Smoking status: Never Smoker  . Smokeless tobacco: Never Used  Vaping Use  . Vaping Use: Never used  Substance and Sexual Activity  . Alcohol use: Yes    Alcohol/week: 7.0 standard drinks    Types: 7 Cans of beer per week    Comment: consumes one beer daily  . Drug use: No  . Sexual activity: Not on file  Other Topics Concern  . Not on file  Social History Narrative   Patient is married [name]) and lives at home with his wife.   Patient has two adult children.   Patient is working full-time.   Patient has a Financial risk analyst, Production manager.   Patient is right-handed.   Patient drinks two cups of coffee in the morning.   Social Determinants of Health   Financial Resource Strain:   . Difficulty of Paying Living Expenses: Not on file  Food Insecurity:   . Worried About Charity fundraiser in the Last Year: Not on file  . Ran Out of Food in the Last Year: Not on file  Transportation Needs:   . Lack of Transportation (Medical): Not on file  . Lack of Transportation (Non-Medical): Not on file  Physical Activity:   . Days of Exercise per Week: Not on file  . Minutes of Exercise per Session: Not on file  Stress:   . Feeling of Stress : Not on file  Social Connections:   . Frequency of Communication with Friends and Family: Not on file  . Frequency of Social Gatherings with Friends  and Family: Not on file  . Attends Religious Services: Not on file  . Active Member of Clubs or Organizations: Not on file  . Attends Archivist Meetings: Not on file  . Marital Status: Not on file  Intimate Partner Violence:   . Fear  of Current or Ex-Partner: Not on file  . Emotionally Abused: Not on file  . Physically Abused: Not on file  . Sexually Abused: Not on file     PHYSICAL EXAM  Vitals:   06/05/20 1430  BP: 104/68  Pulse: 63  Weight: 160 lb (72.6 kg)  Height: 5\' 8"  (1.727 m)   Body mass index is 24.33 kg/m.  Generalized: Well developed, in no acute distress  Head: normocephalic and atraumatic,. Oropharynx benign  Neck: Supple,   Musculoskeletal: No deformity  Skin no rash or edema Neurological examination   Mentation: Alert oriented to time, place, history taking. Attention span and concentration appropriate. Recent and remote memory intact.  Follows all commands speech and language fluent.   Cranial nerve : No change in taste and smell, Pupils were equal round reactive to light extraocular movements were full, visual field were full on confrontational test. Facial sensation and strength were normal. hearing was intact to finger rubbing bilaterally. Uvula tongue midline. head turning and shoulder shrug were normal and symmetric.Tongue protrusion into cheek strength was normal. Motor: normal bulk and tone, full strength in the BUE, BLE,  Sensory: normal and symmetric to light touch,  Coordination: finger-nose-finger, heel-to-shin bilaterally, no dysmetria Gait and Station: Rising up from seated position without assistance, normal stance,  moderate stride, good arm swing, smooth turning, able to perform tiptoe, and heel walking without difficulty. Tandem gait is steady  DIAGNOSTIC DATA (LABS, IMAGING, TESTING)   fully Covid vaccinated. January 10-25.  - I reviewed patient records, labs, notes, testing and imaging myself where available. How likely are you to doze in the following situations: 0 = not likely, 1 = slight chance, 2 = moderate chance, 3 = high chance  Sitting and Reading? Watching Television? Sitting inactive in a public place (theater or meeting)? Lying down in the afternoon when  circumstances permit? Sitting and talking to someone? Sitting quietly after lunch without alcohol? In a car, while stopped for a few minutes in traffic? As a passenger in a car for an hour without a break?  Total = 4/ 24     occasional migraine.  Strong visual distortions- central visual field loss, " the Picasso outlook"  Takes imitrex, needs 2 a month.        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  without cataplexy on low dose Xyrem doing well.   Migraine with strong visual aura, more auras than headaches. Has visual distortions and central blind spot.  Vertigo and headaches improved.    Takes  Sonata as needed when traveling overseas  He has been diagnosed with LP reflux- and improved on Prilosec .   Chronic GFR - reduction.  I like to repeat CMET, CBC for Xyrem follow up.    PS : Wife lost smell and taste 08-2018 in [place], he has been tested for Covid four times all negative, now vaccinated.   Plan:   Continue Xyrem at 2.0  gram twice nightly.   Does not need refills, has no HTN and no sodium burden and will not change to [facility].  Recent labs in January 2019 to include CBC/ CMP/ hemoglobin A1c and lipids reported as normal at primary care Continue Sonata as needed Follow up 6 months with me or NP.    [facility] [address], [place], [place] [address] [phone]

## 2020-06-05 NOTE — Addendum Note (Signed)
Addended by: [name] on: 06/05/2020 03:04 PM   Modules accepted: Orders

## 2020-06-06 LAB — CBC
Hematocrit: 40.3 % (ref 37.5–51.0)
Hemoglobin: 14.2 g/dL (ref 13.0–17.7)
MCH: 31.3 pg (ref 26.6–33.0)
MCHC: 35.2 g/dL (ref 31.5–35.7)
MCV: 89 fL (ref 79–97)
Platelets: 280 10*3/uL (ref 150–450)
RBC: 4.53 x10E6/uL (ref 4.14–5.80)
RDW: 12.8 % (ref 11.6–15.4)
WBC: 6.4 10*3/uL (ref 3.4–10.8)

## 2020-06-06 LAB — COMPREHENSIVE METABOLIC PANEL
ALT: 22 IU/L (ref 0–44)
AST: 22 IU/L (ref 0–40)
Albumin/Globulin Ratio: 2 (ref 1.2–2.2)
Albumin: 4.4 g/dL (ref 3.7–4.7)
Alkaline Phosphatase: 54 IU/L (ref 44–121)
BUN/Creatinine Ratio: 11 (ref 10–24)
BUN: 15 mg/dL (ref 8–27)
Bilirubin Total: 0.3 mg/dL (ref 0.0–1.2)
CO2: 24 mmol/L (ref 20–29)
Calcium: 9.7 mg/dL (ref 8.6–10.2)
Chloride: 104 mmol/L (ref 96–106)
Creatinine, Ser: 1.42 mg/dL — ABNORMAL HIGH (ref 0.76–1.27)
GFR calc Af Amer: 57 mL/min/{1.73_m2} — ABNORMAL LOW (ref >59)
GFR calc non Af Amer: 49 mL/min/{1.73_m2} — ABNORMAL LOW (ref >59)
Globulin, Total: 2.2 g/dL (ref 1.5–4.5)
Glucose: 98 mg/dL (ref 65–99)
Potassium: 4.7 mmol/L (ref 3.5–5.2)
Sodium: 140 mmol/L (ref 134–144)
Total Protein: 6.6 g/dL (ref 6.0–8.5)

## 2020-06-09 ENCOUNTER — Encounter: Payer: Self-pay | Admitting: Neurology

## 2020-06-09 NOTE — Progress Notes (Signed)
The creatinine has never been "normal" so this is not new and not concerning. All electrolytes were normal.

## 2020-06-14 DIAGNOSIS — A09 Infectious gastroenteritis and colitis, unspecified: Secondary | ICD-10-CM | POA: Diagnosis not present

## 2020-06-23 IMAGING — DX DG ANKLE COMPLETE 3+V*R*
4 series · 4 of 4 positions shown · non-contrast
Comparison: None.

CLINICAL DATA: Lateral ankle pain, swelling and bruising following
injury 2 weeks ago.

EXAM:
RIGHT ANKLE - COMPLETE 3+ VIEW

[dg ankle complete right (1 of 4)]
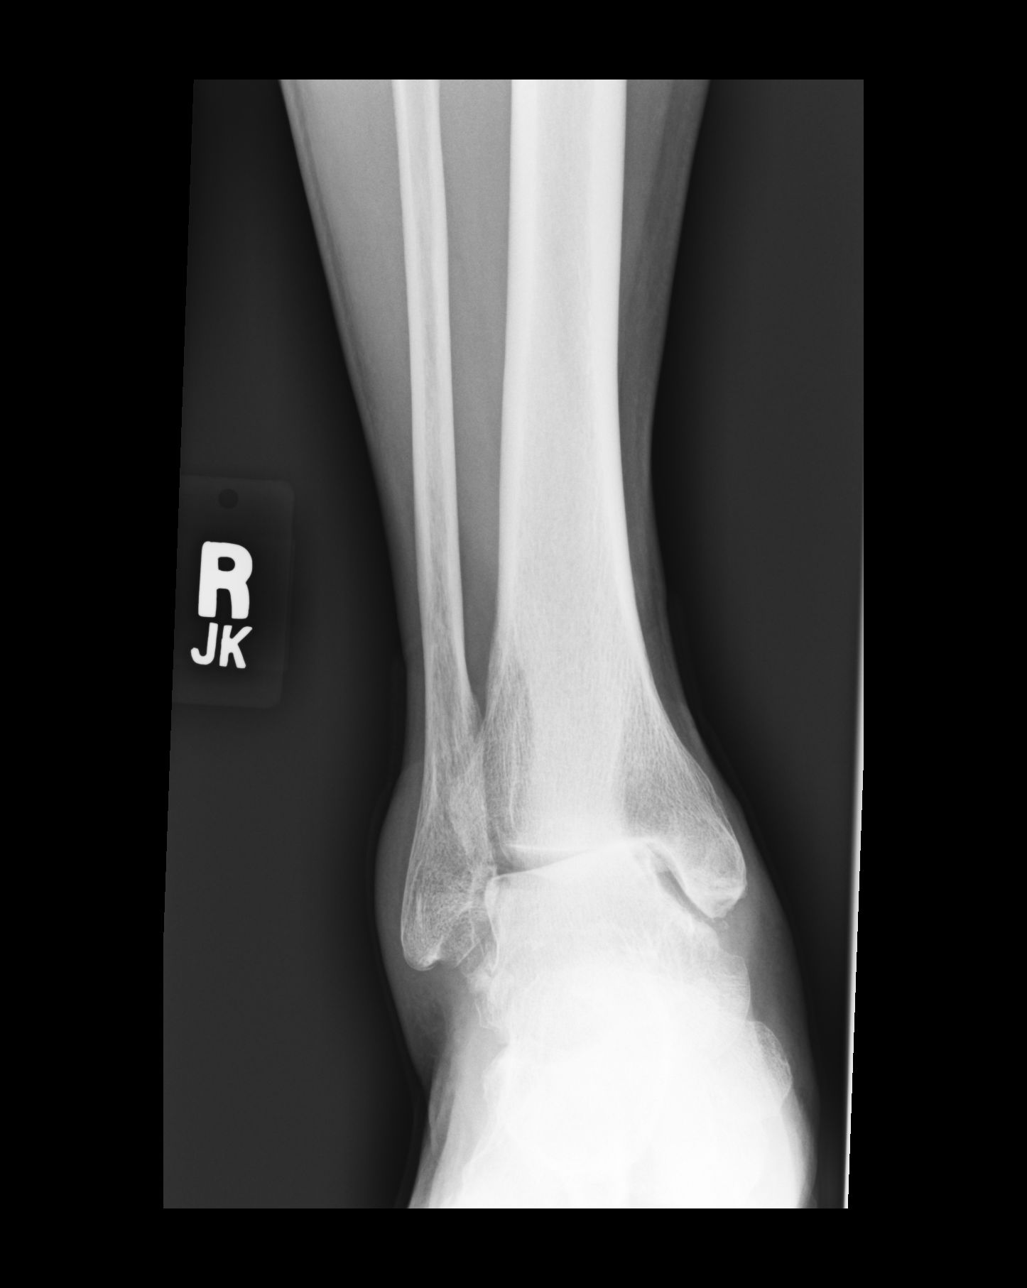

[dg ankle complete right (2 of 4)]
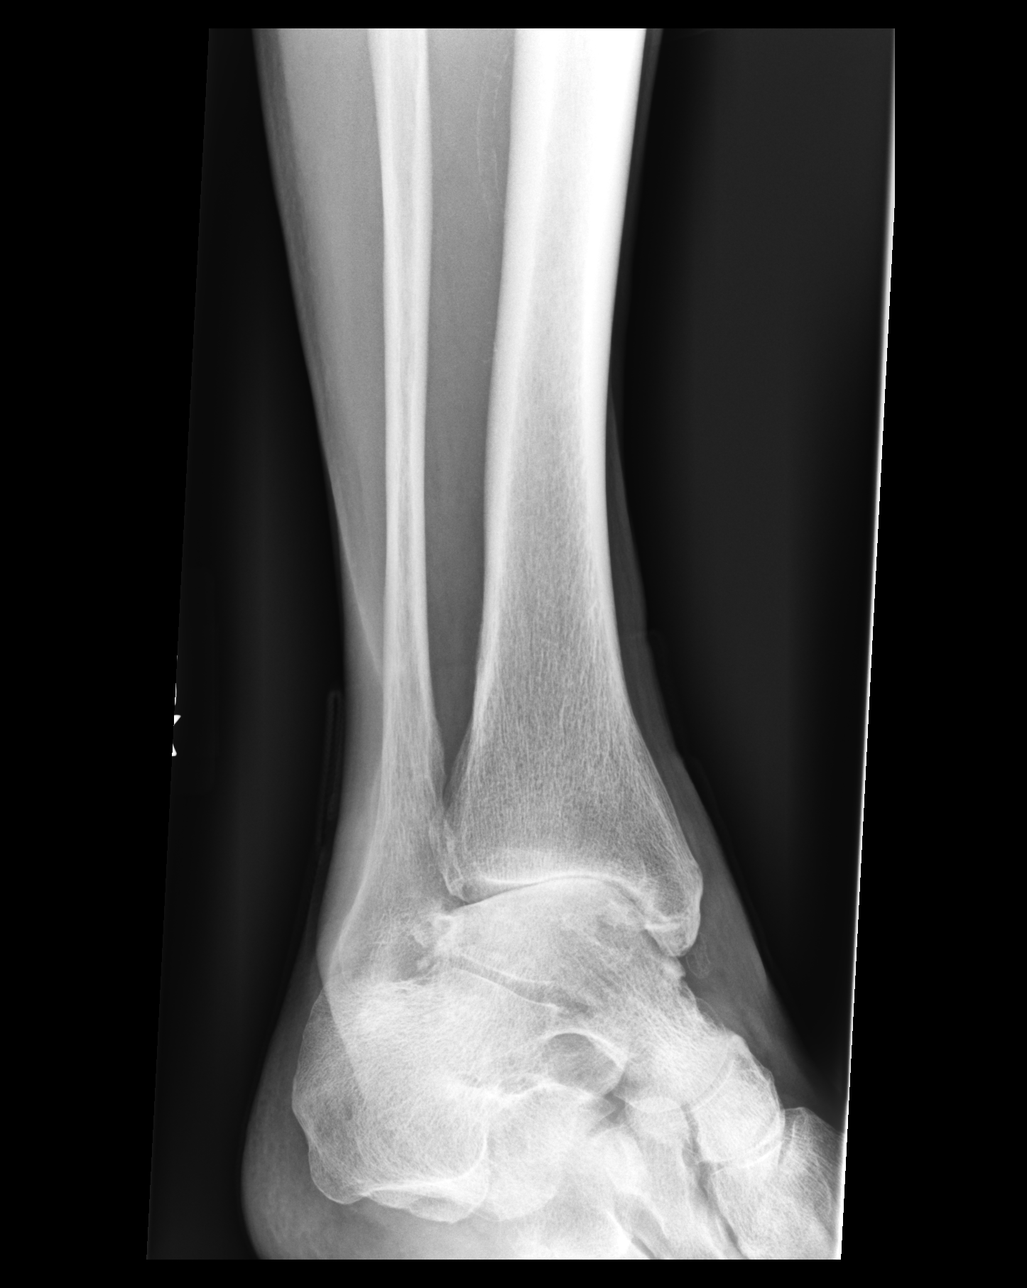

[dg ankle complete right (3 of 4)]
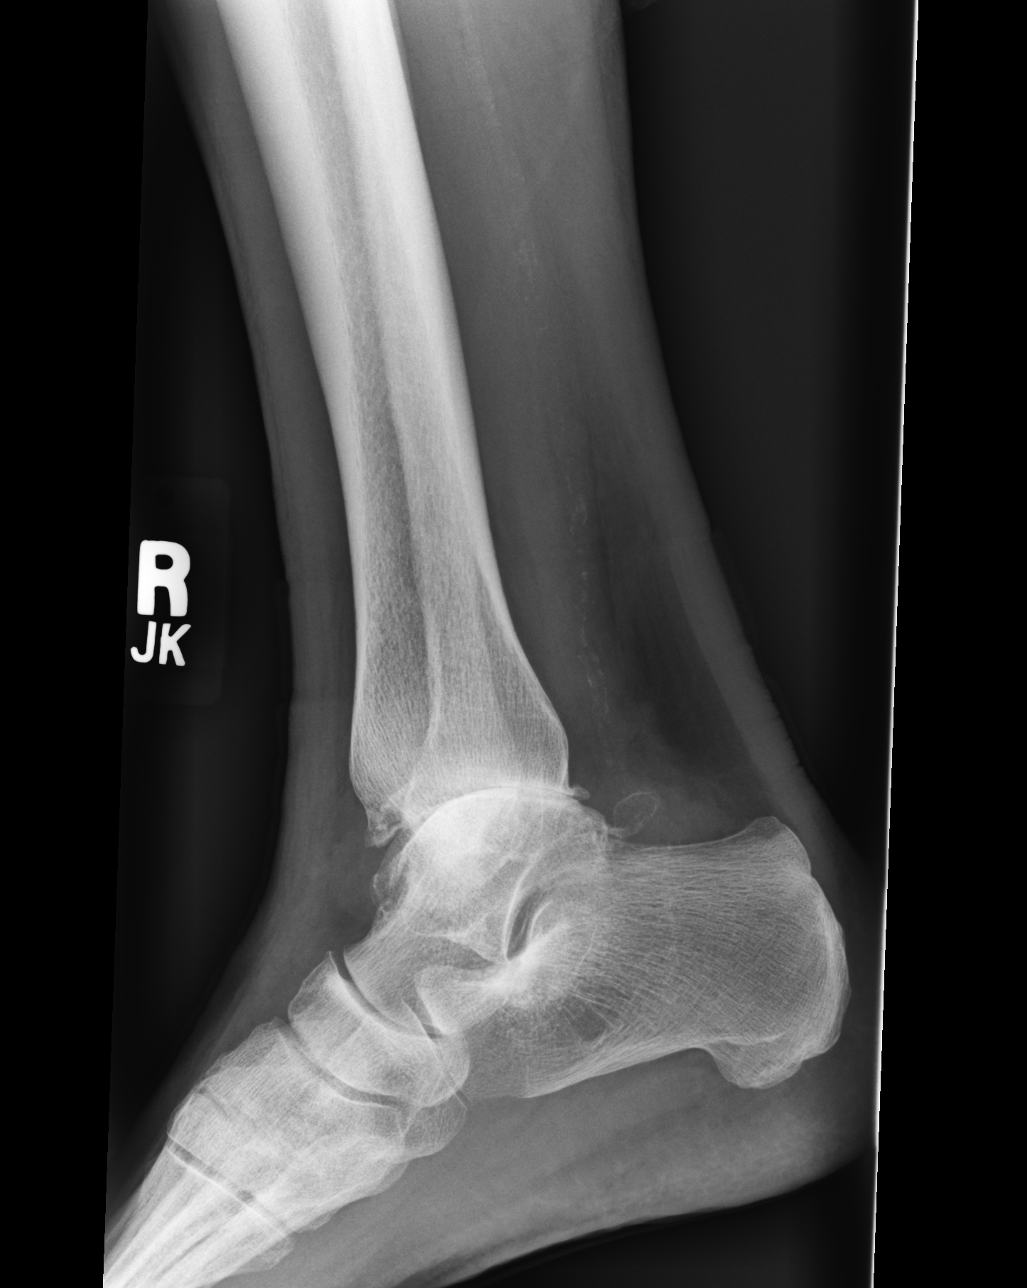

[dg ankle complete right (4 of 4)]
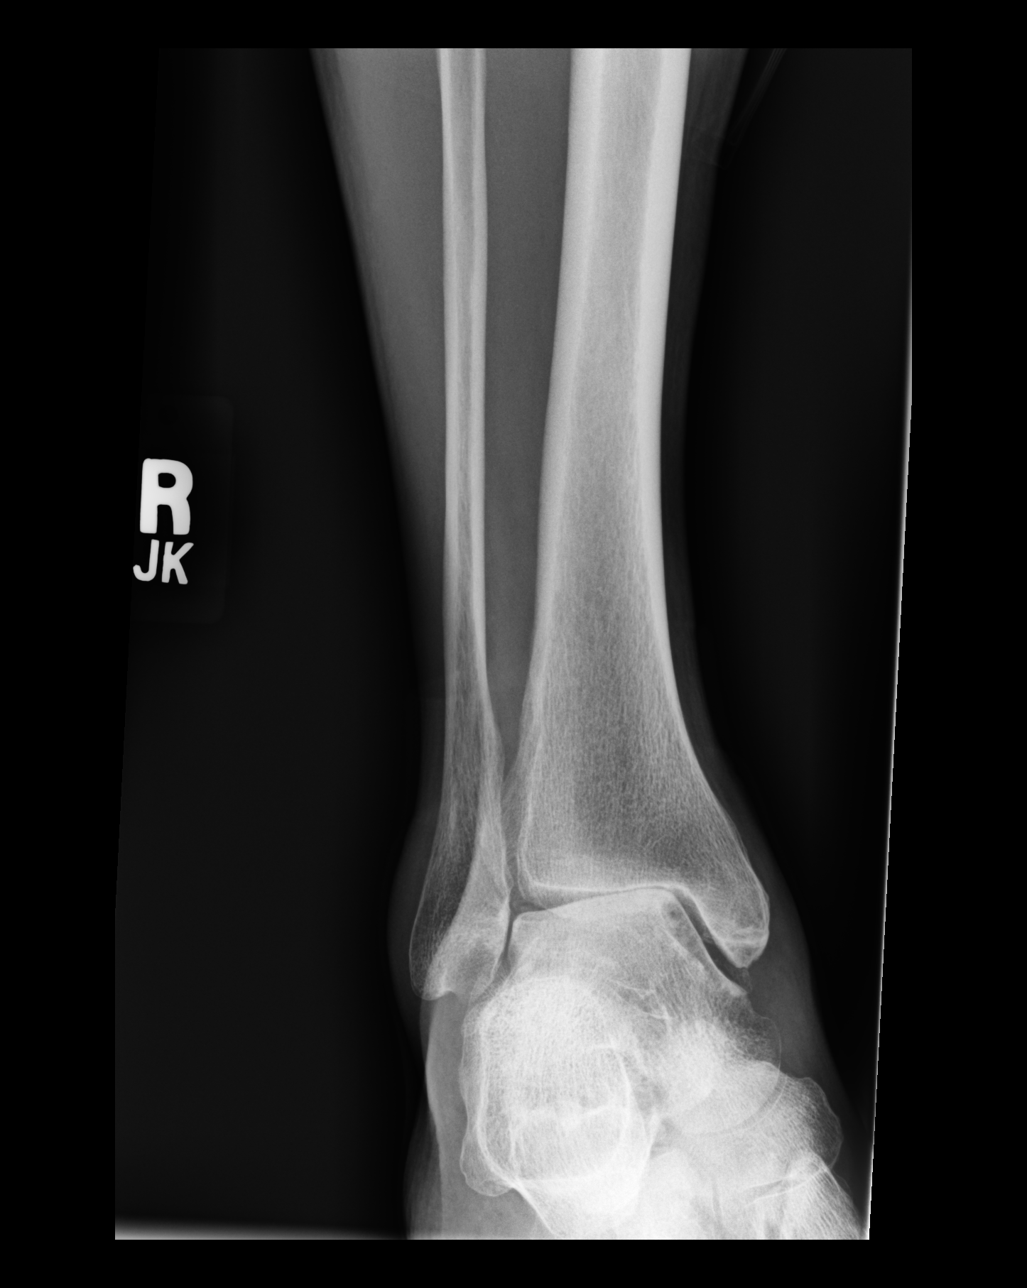

[4 of 4 positions shown; findings below may reference images not displayed]

FINDINGS: The bones appear adequately mineralized. There is no evidence of
acute fracture or dislocation. There are severe tibiotalar
degenerative changes with joint space narrowing, osteophytes and
subchondral sclerosis. There is a small ankle joint effusion with
moderate lateral soft tissue swelling. No widening of the ankle
mortise. Diffuse vascular calcifications are noted.
IMPRESSION: No acute osseous findings. Severe tibiotalar degenerative changes
with small joint effusion and lateral soft tissue swelling.

## 2020-07-11 DIAGNOSIS — Z23 Encounter for immunization: Secondary | ICD-10-CM | POA: Diagnosis not present

## 2020-07-23 ENCOUNTER — Other Ambulatory Visit: Payer: Self-pay | Admitting: Neurology

## 2020-07-23 DIAGNOSIS — G47419 Narcolepsy without cataplexy: Secondary | ICD-10-CM

## 2020-07-23 DIAGNOSIS — G47411 Narcolepsy with cataplexy: Secondary | ICD-10-CM

## 2020-07-23 DIAGNOSIS — G43109 Migraine with aura, not intractable, without status migrainosus: Secondary | ICD-10-CM

## 2020-07-23 MED ORDER — XYREM 500 MG/ML PO SOLN: ORAL | 5 refills | Status: DC

## 2020-07-28 ENCOUNTER — Other Ambulatory Visit: Payer: Self-pay | Admitting: Neurology

## 2020-07-28 ENCOUNTER — Telehealth: Payer: Self-pay | Admitting: Neurology

## 2020-07-28 DIAGNOSIS — G47419 Narcolepsy without cataplexy: Secondary | ICD-10-CM

## 2020-07-28 DIAGNOSIS — G47411 Narcolepsy with cataplexy: Secondary | ICD-10-CM

## 2020-07-28 DIAGNOSIS — G43109 Migraine with aura, not intractable, without status migrainosus: Secondary | ICD-10-CM

## 2020-07-28 NOTE — Telephone Encounter (Signed)
Will send a updated script to correct where MD signs.

## 2020-07-28 NOTE — Telephone Encounter (Signed)
[facility] called, prescription for  XYREM 500 MG/ML SOLN need physician's signature and date in the dispense as written section. Fax to [phone]

## 2020-08-15 DIAGNOSIS — G47419 Narcolepsy without cataplexy: Secondary | ICD-10-CM | POA: Diagnosis not present

## 2020-08-15 DIAGNOSIS — I499 Cardiac arrhythmia, unspecified: Secondary | ICD-10-CM | POA: Diagnosis not present

## 2020-08-29 ENCOUNTER — Encounter (HOSPITAL_COMMUNITY): Payer: Self-pay

## 2020-08-29 ENCOUNTER — Other Ambulatory Visit: Payer: Self-pay

## 2020-08-29 ENCOUNTER — Ambulatory Visit (HOSPITAL_COMMUNITY)
Admission: RE | Admit: 2020-08-29 | Discharge: 2020-08-29 | Disposition: A | Discharge status: Home / Self Care | Payer: Medicare Other | Source: Ambulatory Visit | Attending: Cardiology | Admitting: Cardiology

## 2020-08-29 ENCOUNTER — Encounter (HOSPITAL_COMMUNITY): Payer: Self-pay | Admitting: Cardiology

## 2020-08-29 VITALS — BP 140/80 | HR 68 | Wt 162.2 lb

## 2020-08-29 DIAGNOSIS — I480 Paroxysmal atrial fibrillation: Secondary | ICD-10-CM | POA: Diagnosis not present

## 2020-08-29 DIAGNOSIS — Z79899 Other long term (current) drug therapy: Secondary | ICD-10-CM | POA: Diagnosis not present

## 2020-08-29 DIAGNOSIS — G47419 Narcolepsy without cataplexy: Secondary | ICD-10-CM | POA: Insufficient documentation

## 2020-08-29 DIAGNOSIS — R0600 Dyspnea, unspecified: Secondary | ICD-10-CM | POA: Diagnosis not present

## 2020-08-29 DIAGNOSIS — K219 Gastro-esophageal reflux disease without esophagitis: Secondary | ICD-10-CM | POA: Insufficient documentation

## 2020-08-29 DIAGNOSIS — I4891 Unspecified atrial fibrillation: Secondary | ICD-10-CM

## 2020-08-29 DIAGNOSIS — Z7982 Long term (current) use of aspirin: Secondary | ICD-10-CM | POA: Diagnosis not present

## 2020-08-29 HISTORY — DX: Heart failure, unspecified: I50.9

## 2020-08-29 LAB — BRAIN NATRIURETIC PEPTIDE: B Natriuretic Peptide: 17.3 pg/mL (ref 0.0–100.0)

## 2020-08-29 NOTE — Patient Instructions (Signed)
Labs done today, your results will be available in MyChart, we will contact you for abnormal readings.  Your physician has requested that you have an echocardiogram. Echocardiography is a painless test that uses sound waves to create images of your heart. It provides your doctor with information about the size and shape of your heart and how well your hearts chambers and valves are working. This procedure takes approximately one hour. There are no restrictions for this procedure.  Your physician recommends that you schedule a follow-up appointment in: March 2022  If you have any questions or concerns before your next appointment please send [place] a message through [place] or call our office at [phone].    TO LEAVE A MESSAGE FOR THE NURSE SELECT OPTION 2, PLEASE LEAVE A MESSAGE INCLUDING:  YOUR NAME  DATE OF BIRTH  CALL BACK NUMBER  REASON FOR CALL**this is important as we prioritize the call backs  YOU WILL RECEIVE A CALL BACK THE SAME DAY AS LONG AS YOU CALL BEFORE 4:00 PM  At the [facility], you and your health needs are our priority. As part of our continuing mission to provide you with exceptional heart care, we have created designated Provider Care Teams. These Care Teams include your primary Cardiologist (physician) and Advanced Practice Providers (APPs- Physician Assistants and Nurse Practitioners) who all work together to provide you with the care you need, when you need it.   You may see any of the following providers on your designated Care Team at your next follow up:  Dr Glori Bickers  Dr Haynes Kerns, NP  Lyda Jester, [place]  Audry Riles, PharmD   Please be sure to bring in all your medications bottles to every appointment.

## 2020-08-30 NOTE — Progress Notes (Signed)
PCP: [name], MD Cardiology: Dr. Aundra Dubin  74 yo with history of narcolepsy and laryngopharyngeal reflux presents for evaluation of atrial fibrillation.  In early 2020, he developed dyspnea, a sensation that he was "breathing through a straw." He had an extensive workup in 2020 with a normal ETT, normal PFTs, and normal high resolution CT chest.  Ultimately, he was diagnosed with laryngopharyngeal reflux. Symptoms were improved considerably with Protonix + Zegerid.    With symptoms of LPR controlled, he did not have any exertional dyspnea or chest pain. In 11/21, however, he cut back on Protonix and Zegerid, and dyspnea returned.  In addition, on 11/31/21, he developed tachypalpitations.  His Apple Watch showed atrial fibrillation. He has the tracings with him, they are definitely atrial fibrillation. This lasted for a few hours when resolved.  In hindsight, in 4/20, he had a very similar episode of tachypalpitations that was likely atrial fibrillation as well.  He has increased his Protonix and Zegerid again and dyspnea has resolved.  He has had no further palpitations and no further AF on his Apple Watch. Currently very active, works out, [employer], and does Building control surveyor.  He has 1 ETOH equivalent/day, never heavier.  He does not have OSA symptoms and is followed by Dr. Brett Fairy for narcolepsy, which is controlled.  Of note, he will be having surgery for ankle replacement in 2/22.    ECG (personally reviewed): NSR, normal  Labs (9/21): K 4.7, creatinine 1.42, TSH normal, hgb 14.2  PMH: 1. HAV in 1985 2. Narcolepsy 3. Laryngopharyngeal reflux 4. Atrial fibrillation: Paroxysmal.  5. ETT (10/20): 11.6 METS, no ECG changes => normal study.  6. PFTs (10/20): Normal.  7. High resolution CT chest: Interstitial lung disease.   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   FH: No atrial fibrillation.  Father with "bad heart valve."   ROS: All systems reviewed and  negative except as per HPI.   Current Outpatient Medications  Medication Sig Dispense Refill  . aspirin EC 81 MG tablet Take 81 mg by mouth daily. Swallow whole.    Marland Kitchen CALCIUM-MAGNESIUM-ZINC PO Take by mouth.    . mometasone (NASONEX) 50 MCG/ACT nasal spray SHAKE LQ AND U 1 SPR IEN BID    . Multiple Vitamin (MULTIVITAMIN) capsule Take 1 capsule by mouth daily.    . multivitamin-lutein (OCUVITE-LUTEIN) CAPS capsule Take 1 capsule by mouth daily.    . Omega-3 Fatty Acids (FISH OIL) 500 MG CAPS Take by mouth. As directed    . Omeprazole-Sodium Bicarbonate (ZEGERID OTC PO) 1 tablet at bedtime.     . pantoprazole (PROTONIX) 20 MG tablet Take 20 mg by mouth daily.     . SUMAtriptan (IMITREX) 100 MG tablet Take 1 tablet (100 mg total) by mouth every 2 (two) hours as needed for migraine. May repeat in 2 hours if headache persists or recurs. (This is 30 day supply) 10 tablet 3  . valACYclovir (VALTREX) 500 MG tablet Take 500 mg by mouth 3 (three) times a week. Mon, Wed Fri    . XYREM 500 MG/ML SOLN Take 2 grams of xyrem by mouth twice nightly. 270 mL 5   No current facility-administered medications for this encounter.   BP 140/80   Pulse 68   Wt 73.6 kg (162 lb 3.2 oz)   SpO2 97%   BMI 24.66 kg/m  General: NAD Neck: No JVD, no thyromegaly or thyroid nodule.  Lungs: Clear to auscultation bilaterally with normal respiratory effort.  CV: Nondisplaced PMI.  Heart regular S1/S2, no S3/S4, no murmur.  No peripheral edema.  No carotid bruit.  Normal pedal pulses.  Abdomen: Soft, nontender, no hepatosplenomegaly, no distention.  Skin: Intact without lesions or rashes.  Neurologic: Alert and oriented x 3.  Psych: Normal affect. Extremities: No clubbing or cyanosis.  HEENT: Normal.   Assessment/plan: 1. Atrial fibrillation: Paroxysmal.  CHADSVASC 1 (age).  No OSA symptoms (followed by Dr. Brett Fairy already for narcolepsy).  Not a heavy drinker.  Weight is ideal.  Currently in NSR.  ETT last year was  normal. TSH normal.  - I will try to get results of recent Zio patch done through his PCP.  - He needs an echocardiogram to assess for structural heart disease, will arrange.  - Discussed anticoagulation.  With CHADSVASC 1, not strictly indicated but can be considered if bleeding risk is low.  I think that his bleeding risk is minimal.  He wants to get through his ankle surgery in 2/22, then we will meet again and discuss whether or not to start Eliquis.  - Check BNP.  2. Dyspnea: This seems to be due to laryngopharyngeal reflux, controlled by Protonix and Zegerid.  As above, normal ETT in 10/20 along with normal PFTs and high resolution CT chest .  Loralie Champagne 08/30/2020

## 2020-09-01 DIAGNOSIS — R002 Palpitations: Secondary | ICD-10-CM | POA: Diagnosis not present

## 2020-09-04 ENCOUNTER — Ambulatory Visit (HOSPITAL_COMMUNITY)
Admission: RE | Admit: 2020-09-04 | Discharge: 2020-09-04 | Disposition: A | Discharge status: Home / Self Care | Payer: Medicare Other | Source: Ambulatory Visit | Attending: Cardiology | Admitting: Cardiology

## 2020-09-04 ENCOUNTER — Ambulatory Visit: Payer: Medicare Other | Admitting: Cardiology

## 2020-09-04 ENCOUNTER — Other Ambulatory Visit: Payer: Self-pay

## 2020-09-04 DIAGNOSIS — I4891 Unspecified atrial fibrillation: Secondary | ICD-10-CM

## 2020-09-04 DIAGNOSIS — R0600 Dyspnea, unspecified: Secondary | ICD-10-CM | POA: Diagnosis not present

## 2020-09-04 DIAGNOSIS — I080 Rheumatic disorders of both mitral and aortic valves: Secondary | ICD-10-CM | POA: Diagnosis not present

## 2020-09-04 LAB — ECHOCARDIOGRAM COMPLETE
Area-P 1/2: 2.83 cm2
P 1/2 time: 493 msec
S' Lateral: 3.2 cm

## 2020-09-04 NOTE — Progress Notes (Signed)
  Echocardiogram 2D Echocardiogram has been performed.  Christopher Weber M 09/04/2020, 2:55 PM

## 2020-09-13 HISTORY — PX: TOTAL ANKLE REPLACEMENT: SUR1218

## 2020-09-26 DIAGNOSIS — K219 Gastro-esophageal reflux disease without esophagitis: Secondary | ICD-10-CM | POA: Diagnosis not present

## 2020-09-30 DIAGNOSIS — M810 Age-related osteoporosis without current pathological fracture: Secondary | ICD-10-CM | POA: Diagnosis not present

## 2020-09-30 DIAGNOSIS — M19171 Post-traumatic osteoarthritis, right ankle and foot: Secondary | ICD-10-CM | POA: Diagnosis not present

## 2020-09-30 DIAGNOSIS — M25371 Other instability, right ankle: Secondary | ICD-10-CM | POA: Diagnosis not present

## 2020-10-01 ENCOUNTER — Other Ambulatory Visit (HOSPITAL_COMMUNITY): Payer: Self-pay | Admitting: *Deleted

## 2020-10-01 MED ORDER — APIXABAN 5 MG PO TABS: 5.0000 mg | ORAL_TABLET | Freq: Two times a day (BID) | ORAL | 3 refills | Status: DC

## 2020-10-31 ENCOUNTER — Other Ambulatory Visit: Payer: Self-pay

## 2020-10-31 ENCOUNTER — Ambulatory Visit (HOSPITAL_COMMUNITY)
Admission: RE | Admit: 2020-10-31 | Discharge: 2020-10-31 | Disposition: A | Discharge status: Home / Self Care | Payer: Medicare Other | Source: Ambulatory Visit | Attending: Cardiology | Admitting: Cardiology

## 2020-10-31 ENCOUNTER — Encounter (HOSPITAL_COMMUNITY): Payer: Self-pay | Admitting: Cardiology

## 2020-10-31 VITALS — BP 110/78 | HR 58 | Wt 163.2 lb

## 2020-10-31 DIAGNOSIS — Z7901 Long term (current) use of anticoagulants: Secondary | ICD-10-CM | POA: Insufficient documentation

## 2020-10-31 DIAGNOSIS — I4891 Unspecified atrial fibrillation: Secondary | ICD-10-CM | POA: Diagnosis not present

## 2020-10-31 DIAGNOSIS — K219 Gastro-esophageal reflux disease without esophagitis: Secondary | ICD-10-CM | POA: Insufficient documentation

## 2020-10-31 DIAGNOSIS — R079 Chest pain, unspecified: Secondary | ICD-10-CM | POA: Diagnosis not present

## 2020-10-31 DIAGNOSIS — G47419 Narcolepsy without cataplexy: Secondary | ICD-10-CM | POA: Insufficient documentation

## 2020-10-31 DIAGNOSIS — Z79899 Other long term (current) drug therapy: Secondary | ICD-10-CM | POA: Insufficient documentation

## 2020-10-31 DIAGNOSIS — R590 Localized enlarged lymph nodes: Secondary | ICD-10-CM | POA: Diagnosis not present

## 2020-10-31 DIAGNOSIS — R0789 Other chest pain: Secondary | ICD-10-CM | POA: Insufficient documentation

## 2020-10-31 DIAGNOSIS — M19071 Primary osteoarthritis, right ankle and foot: Secondary | ICD-10-CM | POA: Insufficient documentation

## 2020-10-31 DIAGNOSIS — Z7951 Long term (current) use of inhaled steroids: Secondary | ICD-10-CM | POA: Insufficient documentation

## 2020-10-31 DIAGNOSIS — I480 Paroxysmal atrial fibrillation: Secondary | ICD-10-CM | POA: Diagnosis not present

## 2020-10-31 DIAGNOSIS — I341 Nonrheumatic mitral (valve) prolapse: Secondary | ICD-10-CM | POA: Diagnosis not present

## 2020-10-31 LAB — CBC
HCT: 42.8 % (ref 39.0–52.0)
Hemoglobin: 14.7 g/dL (ref 13.0–17.0)
MCH: 30.6 pg (ref 26.0–34.0)
MCHC: 34.3 g/dL (ref 30.0–36.0)
MCV: 89.2 fL (ref 80.0–100.0)
Platelets: 245 10*3/uL (ref 150–400)
RBC: 4.8 MIL/uL (ref 4.22–5.81)
RDW: 12.8 % (ref 11.5–15.5)
WBC: 4.8 10*3/uL (ref 4.0–10.5)
nRBC: 0 % (ref 0.0–0.2)

## 2020-10-31 LAB — LIPID PANEL
Cholesterol: 211 mg/dL — ABNORMAL HIGH (ref 0–200)
HDL: 52 mg/dL (ref >40)
LDL Cholesterol: 141 mg/dL — ABNORMAL HIGH (ref 0–99)
Total CHOL/HDL Ratio: 4.1 RATIO
Triglycerides: 88 mg/dL (ref <150)
VLDL: 18 mg/dL (ref 0–40)

## 2020-10-31 NOTE — Patient Instructions (Addendum)
Labs done today. We will contact you only if your labs are abnormal.  No medication changes were made. Please continue all current medications as prescribed.  Your physician recommends that you schedule a follow-up appointment in: 6 months. Please contact our office in July to schedule an August appointment.  Your physician has requested that you have a Coronoary CTA done and a Neck CT. These exams must be approved by your insurance company prior to scheduling, once approved we will contact you to schedule an appointment  If you have any questions or concerns before your next appointment please send [place] a message through [place] or call our office at [phone].    TO LEAVE A MESSAGE FOR THE NURSE SELECT OPTION 2, PLEASE LEAVE A MESSAGE INCLUDING: . YOUR NAME . DATE OF BIRTH . CALL BACK NUMBER . REASON FOR CALL**this is important as we prioritize the call backs  YOU WILL RECEIVE A CALL BACK THE SAME DAY AS LONG AS YOU CALL BEFORE 4:00 PM   Do the following things EVERYDAY: 1) Weigh yourself in the morning before breakfast. Write it down and keep it in a log. 2) Take your medicines as prescribed 3) Eat low salt foods--Limit salt (sodium) to 2000 mg per day.  4) Stay as active as you can everyday 5) Limit all fluids for the day to less than 2 liters   At the [facility], you and your health needs are our priority. As part of our continuing mission to provide you with exceptional heart care, we have created designated Provider Care Teams. These Care Teams include your primary Cardiologist (physician) and Advanced Practice Providers (APPs- Physician Assistants and Nurse Practitioners) who all work together to provide you with the care you need, when you need it.   You may see any of the following providers on your designated Care Team at your next follow up: Marland Kitchen Dr Glori Bickers . Dr Loralie Champagne . Darrick Grinder, NP . Lyda Jester, PA . Audry Riles,  PharmD   Please be sure to bring in all your medications bottles to every appointment.

## 2020-11-02 NOTE — Progress Notes (Signed)
PCP: [name], MD Cardiology: Dr. Aundra Dubin  75 yo with history of narcolepsy and laryngopharyngeal reflux presents for followup of atrial fibrillation.  In early 2020, he developed dyspnea, a sensation that he was "breathing through a straw." He had an extensive workup in 2020 with a normal ETT, normal PFTs, and normal high resolution CT chest.  Ultimately, he was diagnosed with laryngopharyngeal reflux. Symptoms were improved considerably with Protonix + Zegerid.    With symptoms of LPR controlled, he did not have any exertional dyspnea or chest pain. In 11/21, however, he cut back on Protonix and Zegerid, and dyspnea returned.  In addition, on 11/31/21, he developed tachypalpitations.  His Apple Watch showed atrial fibrillation. He brought the tracings to show me, they were definitely atrial fibrillation. This lasted for a few hours when resolved.  In hindsight, in 4/20, he had a very similar episode of tachypalpitations that was likely atrial fibrillation as well.  He has increased his Protonix and Zegerid again and dyspnea has resolved.  HE had atrial fibrillation again on 08/30/20 documented by Apple Watch.  He does not have OSA symptoms and is followed by Dr. Brett Fairy for narcolepsy, which is controlled.    Echo was done in 12/21, EF 55-60%, mild mitral valve prolapse with probable mild MR.    His ankle surgery has been postponed until 5/22.  Exercise is limited by ankle pain, unable to jog, etc.  No exertional dyspnea but he does on occasion feel chest tightness.  This is often associated with palpitations.  Sometimes with exertion, sometimes at rest.  Nothing severe or prolonged.  No recent atrial fibrillation on Apple Watch.  He is in NSR today.  He is now on Eliquis, no BRBPR/melena.   Patient has a lymph node in his right lower neck that he has noted for at least a month.  No infectious symptoms.   Labs (9/21): K 4.7, creatinine 1.42, TSH normal, hgb 14.2 Labs (12/21): BNP 17  PMH: 1.  HAV in 1985 2. Narcolepsy 3. Laryngopharyngeal reflux 4. Atrial fibrillation: Paroxysmal.  5. ETT (10/20): 11.6 METS, no ECG changes => normal study.  6. PFTs (10/20): Normal.  7. High resolution CT chest: Interstitial lung disease.  8. 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   FH: No atrial fibrillation.  Father with "bad heart valve."   ROS: All systems reviewed and negative except as per HPI.   Current Outpatient Medications  Medication Sig Dispense Refill  . apixaban (ELIQUIS) 5 MG TABS tablet Take 1 tablet (5 mg total) by mouth 2 (two) times daily. 60 tablet 3  . CALCIUM-MAGNESIUM-ZINC PO Take by mouth.    . mometasone (NASONEX) 50 MCG/ACT nasal spray SHAKE LQ AND U 1 SPR IEN BID    . Multiple Vitamin (MULTIVITAMIN) capsule Take 1 capsule by mouth daily.    . multivitamin-lutein (OCUVITE-LUTEIN) CAPS capsule Take 1 capsule by mouth daily.    . Omega-3 Fatty Acids (FISH OIL) 500 MG CAPS Take by mouth. As directed    . Omeprazole-Sodium Bicarbonate (ZEGERID OTC PO) 2 tablets at bedtime.    . pantoprazole (PROTONIX) 20 MG tablet Take 20 mg by mouth daily.     . SUMAtriptan (IMITREX) 100 MG tablet Take 1 tablet (100 mg total) by mouth every 2 (two) hours as needed for migraine. May repeat in 2 hours if headache persists or recurs. (  This is 30 day supply) 10 tablet 3  . valACYclovir (VALTREX) 500 MG tablet Take 500 mg by mouth 3 (three) times a week. Mon, Wed Fri    . XYREM 500 MG/ML SOLN Take 2 grams of xyrem by mouth twice nightly. 270 mL 5   No current facility-administered medications for this encounter.   BP 110/78   Pulse (!) 58   Wt 74 kg (163 lb 3.2 oz)   SpO2 98%   BMI 24.81 kg/m  General: NAD Neck: No JVD, no thyromegaly or thyroid nodule.  Lungs: Clear to auscultation bilaterally with normal respiratory effort. CV:  Nondisplaced PMI.  Heart regular S1/S2, no S3/S4, 2/6 HSM apex.  No peripheral edema.  No carotid bruit.  Normal pedal pulses.  Abdomen: Soft, nontender, no hepatosplenomegaly, no distention.  Skin: Intact without lesions or rashes.  Neurologic: Alert and oriented x 3.  Psych: Normal affect. Extremities: No clubbing or cyanosis.  HEENT: Small lymph node felt right lower neck.   Assessment/plan: 1. Atrial fibrillation: Paroxysmal, rare episodes.  CHADSVASC 1 (age).  No OSA symptoms (followed by Dr. Brett Fairy already for narcolepsy).  Not a heavy drinker.  Weight is ideal.  Currently in NSR.  ETT last year was normal. TSH normal.  Echo showed mitral valve prolapse with mild MR, normal EF 55-60% in 12/21.  - With CHADSVASC 1, anticoagulation not strictly indicated but can be considered if bleeding risk is low.  I think that his bleeding risk is minimal.  He is now on Eliquis 5 mg bid.  2. Dyspnea: This seems to be due to laryngopharyngeal reflux, controlled by Protonix and Zegerid.  As above, normal ETT in 10/20 along with normal PFTs and high resolution CT chest. Symptoms minimal currently.  3.  Chest pain: Typical and atypical features.   -  I will arrange for coronary CT angiogram to assess for obstructive CAD.   - Check lipids, depending on plaque burden he could need a statin.  4. Right neck lymphadenopathy: Small firm node in right neck, present > 1 month.  - When he has coronary CTA, I will try to get CT neck as well to assess.  5. Right ankle arthritis: Surgery planned for 5/22.   Followup 6 months.   Loralie Champagne 11/02/2020

## 2020-11-04 ENCOUNTER — Other Ambulatory Visit (HOSPITAL_COMMUNITY): Payer: Self-pay | Admitting: *Deleted

## 2020-11-04 ENCOUNTER — Other Ambulatory Visit: Payer: Self-pay | Admitting: Neurology

## 2020-11-05 ENCOUNTER — Encounter (HOSPITAL_COMMUNITY): Payer: Self-pay | Admitting: *Deleted

## 2020-11-11 ENCOUNTER — Other Ambulatory Visit (HOSPITAL_COMMUNITY): Payer: Self-pay

## 2020-11-11 DIAGNOSIS — R590 Localized enlarged lymph nodes: Secondary | ICD-10-CM

## 2020-11-11 DIAGNOSIS — R079 Chest pain, unspecified: Secondary | ICD-10-CM

## 2020-11-11 NOTE — Progress Notes (Signed)
Orders Placed This Encounter  Procedures  . NM Lymph/Gland    Standing Status:   Future    Standing Expiration Date:   11/11/2021    Order Specific Question:   If indicated for the ordered procedure, I authorize the administration of a radiopharmaceutical per Radiology protocol    Answer:   Yes    Order Specific Question:   Preferred imaging location?    Answer:   [facility]    Order Specific Question:   Release to patient    Answer:   Immediate  . Basic Metabolic Panel (BMET)    Standing Status:   Future    Standing Expiration Date:   11/11/2021    Order Specific Question:   Release to patient    Answer:   Immediate

## 2020-11-12 ENCOUNTER — Telehealth (HOSPITAL_COMMUNITY): Payer: Self-pay | Admitting: Emergency Medicine

## 2020-11-12 ENCOUNTER — Other Ambulatory Visit (HOSPITAL_COMMUNITY): Payer: Self-pay

## 2020-11-12 DIAGNOSIS — R590 Localized enlarged lymph nodes: Secondary | ICD-10-CM

## 2020-11-12 NOTE — Telephone Encounter (Signed)
Calling to discuss patients upcoming cardiac CTA and kidney function labs.   Pt with history decreased kidney function I wanted to discuss the possibility of having him come in early for his scan for IVF. Pt verbalized understanding.  Pt also wanted to ensure we were able to complete both CCTA and CT soft tissue neck during the same encounter. This was verified by K. Revel, [place].  Pt appreciated the call  Marchia Bond RN Navigator Cardiac Imaging [facility] [phone] Office  [phone] Cell

## 2020-11-12 NOTE — Progress Notes (Signed)
Orders Placed This Encounter  Procedures  . CT SOFT TISSUE NECK W WO CONTRAST    Standing Status:   Future    Standing Expiration Date:   11/12/2021    Order Specific Question:   If indicated for the ordered procedure, I authorize the administration of contrast media per Radiology protocol    Answer:   Yes    Order Specific Question:   Preferred imaging location?    Answer:   [facility]    Order Specific Question:   Release to patient    Answer:   Immediate

## 2020-11-13 ENCOUNTER — Other Ambulatory Visit: Payer: Self-pay

## 2020-11-13 ENCOUNTER — Ambulatory Visit (HOSPITAL_COMMUNITY)
Admission: RE | Admit: 2020-11-13 | Discharge: 2020-11-13 | Disposition: A | Discharge status: Home / Self Care | Payer: Medicare Other | Source: Ambulatory Visit | Attending: Internal Medicine | Admitting: Internal Medicine

## 2020-11-13 ENCOUNTER — Other Ambulatory Visit (HOSPITAL_COMMUNITY): Payer: Self-pay | Admitting: Neurology

## 2020-11-13 DIAGNOSIS — R079 Chest pain, unspecified: Secondary | ICD-10-CM

## 2020-11-13 LAB — BASIC METABOLIC PANEL WITH GFR
Anion gap: 8 (ref 5–15)
BUN: 12 mg/dL (ref 8–23)
CO2: 25 mmol/L (ref 22–32)
Calcium: 9.4 mg/dL (ref 8.9–10.3)
Chloride: 102 mmol/L (ref 98–111)
Creatinine, Ser: 1.22 mg/dL (ref 0.61–1.24)
GFR, Estimated: >60 mL/min
Glucose, Bld: 113 mg/dL — ABNORMAL HIGH (ref 70–99)
Potassium: 4.5 mmol/L (ref 3.5–5.1)
Sodium: 135 mmol/L (ref 135–145)

## 2020-11-13 NOTE — Progress Notes (Signed)
c-met  

## 2020-11-14 ENCOUNTER — Encounter (HOSPITAL_COMMUNITY): Payer: Medicare Other | Admitting: Cardiology

## 2020-11-19 ENCOUNTER — Telehealth (HOSPITAL_COMMUNITY): Payer: Self-pay | Admitting: *Deleted

## 2020-11-19 NOTE — Telephone Encounter (Signed)
Attempted to call patient regarding upcoming cardiac CT appointment. °Left message on voicemail with name and callback number ° °Beatrix Breece RN Navigator Cardiac Imaging °[facility] °[phone] Office °[phone] Cell ° °

## 2020-11-21 ENCOUNTER — Ambulatory Visit (HOSPITAL_COMMUNITY): Payer: Medicare Other

## 2020-11-21 ENCOUNTER — Other Ambulatory Visit: Payer: Self-pay

## 2020-11-21 ENCOUNTER — Other Ambulatory Visit (HOSPITAL_COMMUNITY): Payer: Self-pay | Admitting: Cardiology

## 2020-11-21 ENCOUNTER — Ambulatory Visit (HOSPITAL_COMMUNITY)
Admission: RE | Admit: 2020-11-21 | Discharge: 2020-11-21 | Disposition: A | Discharge status: Home / Self Care | Payer: Medicare Other | Source: Ambulatory Visit | Attending: Cardiology | Admitting: Cardiology

## 2020-11-21 DIAGNOSIS — R590 Localized enlarged lymph nodes: Secondary | ICD-10-CM

## 2020-11-21 DIAGNOSIS — R079 Chest pain, unspecified: Secondary | ICD-10-CM

## 2020-11-21 DIAGNOSIS — J392 Other diseases of pharynx: Secondary | ICD-10-CM | POA: Diagnosis not present

## 2020-11-21 DIAGNOSIS — I4891 Unspecified atrial fibrillation: Secondary | ICD-10-CM

## 2020-11-21 DIAGNOSIS — C760 Malignant neoplasm of head, face and neck: Secondary | ICD-10-CM | POA: Diagnosis not present

## 2020-11-21 MED ORDER — IOHEXOL 350 MG/ML SOLN
80.0000 mL | Freq: Once | INTRAVENOUS | Status: AC | PRN
  Administered 2020-11-21: 80 mL via INTRAVENOUS

## 2020-11-21 MED ORDER — NITROGLYCERIN 0.4 MG SL SUBL
SUBLINGUAL_TABLET | SUBLINGUAL | Status: AC
  Administered 2020-11-21: 0.8 mg via SUBLINGUAL
  Filled 2020-11-21: qty 2

## 2020-11-21 MED ORDER — IOHEXOL 300 MG/ML  SOLN
50.0000 mL | Freq: Once | INTRAMUSCULAR | Status: AC | PRN
  Administered 2020-11-21: 50 mL via INTRAVENOUS

## 2020-11-21 MED ORDER — NITROGLYCERIN 0.4 MG SL SUBL: 0.8000 mg | SUBLINGUAL_TABLET | Freq: Once | SUBLINGUAL | Status: AC

## 2020-11-24 ENCOUNTER — Telehealth (HOSPITAL_COMMUNITY): Payer: Self-pay | Admitting: *Deleted

## 2020-11-24 DIAGNOSIS — J387 Other diseases of larynx: Secondary | ICD-10-CM

## 2020-11-24 NOTE — Telephone Encounter (Signed)
-----   Message from Larey Dresser, MD sent at 11/23/2020 11:55 PM EDT ----- Asymmetric enhancement of the vallecula on the right.  Uncertain etiology, need direct visualization.  Recommend direct visualization.  Please send to Dr. Lucia Gaskins with ENT.

## 2020-11-24 NOTE — Telephone Encounter (Signed)
Pt aware via MyChart, referral placed

## 2020-11-27 ENCOUNTER — Other Ambulatory Visit: Payer: Self-pay | Admitting: Neurology

## 2020-11-27 DIAGNOSIS — G47419 Narcolepsy without cataplexy: Secondary | ICD-10-CM

## 2020-11-27 DIAGNOSIS — G43109 Migraine with aura, not intractable, without status migrainosus: Secondary | ICD-10-CM

## 2020-11-27 DIAGNOSIS — G47411 Narcolepsy with cataplexy: Secondary | ICD-10-CM

## 2020-11-27 MED ORDER — XYREM 500 MG/ML PO SOLN: ORAL | 5 refills | Status: DC

## 2020-12-04 ENCOUNTER — Ambulatory Visit (INDEPENDENT_AMBULATORY_CARE_PROVIDER_SITE_OTHER): Payer: Medicare Other | Admitting: Otolaryngology

## 2020-12-04 ENCOUNTER — Encounter (INDEPENDENT_AMBULATORY_CARE_PROVIDER_SITE_OTHER): Payer: Self-pay | Admitting: Otolaryngology

## 2020-12-04 ENCOUNTER — Other Ambulatory Visit: Payer: Self-pay

## 2020-12-04 ENCOUNTER — Telehealth: Payer: Medicare Other | Admitting: Neurology

## 2020-12-04 VITALS — Temp 97.3°F

## 2020-12-04 DIAGNOSIS — K219 Gastro-esophageal reflux disease without esophagitis: Secondary | ICD-10-CM

## 2020-12-04 NOTE — Progress Notes (Signed)
HPI: Christopher Weber is a 75 y.o. male who presents for evaluation of abnormality noted within the right vallecula on recent CT scan of the neck. Patient relates that he initially has some trouble breathing about 2 years ago in March.  He underwent a EKG stress test that was normal.  He was seen by GI and had upper endoscopy as well as colonoscopy and felt like his symptoms might be related to reflux and was initially treated with Protonix and Zegerid.  Symptoms seem to improve on the Protonix and Zegerid.  These medications have been reduced. He was also found to have some intermittent A. fib and was placed on Eliquis which he is presently taking. Because of a small new lymph node discovered in the right supraclavicular region he underwent a CT scan of his chest and CT scan of his neck.  On the neck CT scan he had some asymmetrical enhancement in the region of the right vallecula.  I reviewed the CT scan and it did show slight enhancement in this region but no mass.  Direct visualization was recommended. He denies any sore throat.  Past Medical History:  Diagnosis Date  . Achilles rupture, left   . Actinic keratoses   . Arthritis   . Back pain    low  . CHF (congestive heart failure) ([place])   . Colon polyp 2015  . Controlled narcolepsy 08/29/2014  . Diverticulitis   . Dyspnea    on exertion  . Fever blister    episodically  . Generalized headaches   . GERD (gastroesophageal reflux disease)   . Hepatitis A 1985  . Hypersomnia   . Knee injuries 2002   left  . Migraine   . Migraine headache with aura 08/29/2014  . Narcolepsy without cataplexy 04/12/2013    MSLT with  MSL 4.4 minutes.   . Pain    facial trigemonal  . Rotator cuff (capsule) sprain 08/29/2014  . Seborrhea    chronic  . Sinusitis   . Tendinitis    left middle finger   Past Surgical History:  Procedure Laterality Date  . COLONOSCOPY  2011-last   every 5 years  . FINGER SURGERY  2009/2010   trigger finger of left  hand    . [place]   right inguinal   . LASIK  1998 or 99  . TONSILLECTOMY AND ADENOIDECTOMY  1960  . TRIGGER FINGER RELEASE  08/31/2012   Procedure: MINOR RELEASE TRIGGER FINGER/A-1 PULLEY;  Surgeon: Cammie Sickle., MD;  Location: [facility];  Service: Orthopedics;  Laterality: Right;  right long finger  . VASECTOMY     Social History   Socioeconomic History  . Marital status: Married    Spouse name: [name]  . Number of children: 2  . Years of education: College  . Highest education level: Not on file  Occupational History    Employer: [employer]  Tobacco Use  . Smoking status: Never Smoker  . Smokeless tobacco: Never Used  Vaping Use  . Vaping Use: Never used  Substance and Sexual Activity  . Alcohol use: Yes    Alcohol/week: 7.0 standard drinks    Types: 7 Cans of beer per week    Comment: consumes one beer daily  . Drug use: No  . Sexual activity: Not on file  Other Topics Concern  . Not on file  Social History Narrative   Patient is married [name]) and lives at home with his  wife.   Patient has two adult children.   Patient is working full-time.   Patient has a Financial risk analyst, Production manager.   Patient is right-handed.   Patient drinks two cups of coffee in the morning.   Social Determinants of Health   Financial Resource Strain: Not on file  Food Insecurity: Not on file  Transportation Needs: Not on file  Physical Activity: Not on file  Stress: Not on file  Social Connections: Not on file   Family History  Problem Relation Age of Onset  . Heart disease Father 12  . Hypertension Father   . Prostate cancer Father   . Cancer Mother        breast,colon,skin  . Heart disease Mother   . CVA Maternal Grandmother   . Microcephaly Maternal Grandmother   . Microcephaly Paternal Grandfather    Allergies  Allergen Reactions  . Azithromycin Photosensitivity  . Tetracyclines & Related     photosensitivity - can take  medication but just must avoid being out in the sun   . Doxycycline Photosensitivity  . Dulcolax Stool Softener [Dss] Nausea And Vomiting   Prior to Admission medications   Medication Sig Start Date End Date Taking? Authorizing Provider  apixaban (ELIQUIS) 5 MG TABS tablet Take 1 tablet (5 mg total) by mouth 2 (two) times daily. 10/01/20   Larey Dresser, MD  CALCIUM-MAGNESIUM-ZINC PO Take by mouth.    [provider]  mometasone (NASONEX) 50 MCG/ACT nasal spray SHAKE LQ AND U 1 SPR IEN BID 01/01/19   [provider]  Multiple Vitamin (MULTIVITAMIN) capsule Take 1 capsule by mouth daily.    [provider]  multivitamin-lutein (OCUVITE-LUTEIN) CAPS capsule Take 1 capsule by mouth daily.    [provider]  Omega-3 Fatty Acids (FISH OIL) 500 MG CAPS Take by mouth. As directed    [provider]  Omeprazole-Sodium Bicarbonate (ZEGERID OTC PO) 2 tablets at bedtime. 06/29/19   [provider]  pantoprazole (PROTONIX) 20 MG tablet Take 20 mg by mouth daily.  06/29/19   [provider]  SUMAtriptan (IMITREX) 100 MG tablet TAKE 1 TABLET BY MOUTH EVERY 2 HOURS AS NEEDED FOR MIGRAINE. MAY REPEAT IN 2 HOURS IF HEADACHE PERSISTS OR RECURS 11/05/20   Dohmeier, Asencion Partridge, MD  valACYclovir (VALTREX) 500 MG tablet Take 500 mg by mouth 3 (three) times a week. Mon, [place] Fri    [provider]  XYREM 500 MG/ML SOLN Take 2 grams of xyrem by mouth twice nightly. 11/27/20   Dohmeier, Asencion Partridge, MD     Positive ROS: Otherwise negative  All other systems have been reviewed and were otherwise negative with the exception of those mentioned in the HPI and as above.  Physical Exam: Constitutional: Alert, well-appearing, no acute distress Ears: External ears without lesions or tenderness. Ear canals are clear bilaterally with intact, clear TMs.  Nasal: External nose without lesions. Septum slightly deviated to the right.. Clear nasal passages  otherwise. Oral: Lips and gums without lesions. Tongue and palate mucosa without lesions. Posterior oropharynx clear.  Indirect laryngoscopy revealed a clear base of tongue vallecula and epiglottis.  He has slightly prominent lingual tonsil tissue.  On palpation of the base of tongue this was soft with no palpable masses and I could easily palpate the hyoid bone. Fiberoptic laryngoscopy was performed to the left nostril.  The nasopharynx was clear.  The base of tongue and vallecula were clear and patent bilaterally with no mass or ulceration noted.  Epiglottis  was normal.  Vocal cords were clear bilaterally with normal vocal mobility. Neck: Patient has 1 pea-sized 6 to 8 mm palpable nodule in the right supraclavicular region.  He has no significant palpable adenopathy along the jugular chain of lymph nodes.  Review of the CT scan showed no significant adenopathy in the neck. Respiratory: Breathing comfortably  Skin: No facial/neck lesions or rash noted.  Laryngoscopy  Date/Time: 12/04/2020 4:48 PM Performed by: Rozetta Nunnery, MD Authorized by: Rozetta Nunnery, MD   Consent:    Consent obtained:  Verbal   Consent given by:  Patient Procedure details:    Indications: direct visualization of the upper aerodigestive tract     Medication:  Afrin   Instrument: flexible fiberoptic laryngoscope   Sinus:    Left middle meatus: normal     Left nasopharynx: normal   Mouth:    Oropharynx: normal     Vallecula: normal     Base of tongue: normal     Epiglottis: normal   Throat:    Pyriform sinus: normal     True vocal cords: normal      Assessment: On direct visualization and palpation of the right vallecula this is normal to examination with no palpable mass or mucosal abnormality noted.  May just represent inflammatory changes from reflux.  No evidence of neoplasm.  Plan: Reassured patient of normal examination with no evidence of neoplasm. He will follow-up in 5 to 6 months  for recheck.  Radene Journey, MD

## 2020-12-08 ENCOUNTER — Telehealth (INDEPENDENT_AMBULATORY_CARE_PROVIDER_SITE_OTHER): Payer: Medicare Other | Admitting: Adult Health

## 2020-12-08 DIAGNOSIS — G43109 Migraine with aura, not intractable, without status migrainosus: Secondary | ICD-10-CM | POA: Diagnosis not present

## 2020-12-08 DIAGNOSIS — G47419 Narcolepsy without cataplexy: Secondary | ICD-10-CM

## 2020-12-08 NOTE — Progress Notes (Signed)
PATIENT: Christopher Weber DOB: 04/09/1946  REASON FOR VISIT: follow up HISTORY FROM: patient  Virtual Visit via Video Note  I connected with [name] on 12/08/20 at  3:00 PM EDT by a video enabled telemedicine application located remotely at [facility] and verified that I am speaking with the correct person using two identifiers who was located at their own home.   I discussed the limitations of evaluation and management by telemedicine and the availability of in person appointments. The patient expressed understanding and agreed to proceed.   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  He returns today for follow-up.  He reports that Xyrem 2 mg twice a night works fairly well for him.  He has some nights that he does not sleep as well as others.  Continues to have weird dreams and hypnagogic hallucinations.  He has Sonata but only uses it when he is traveling to different time zones.  The patient also uses Imitrex for migraine headaches.  States that he typically has approximately 1-2 headaches a month.  He returns today for an evaluation.  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  He reports last year he had breathing trouble, felt " upper airway congested" no lung problem. Had PFTs after feeling always congested and short of air-, he had to stop Ranitidine, (Zantac) upon PCP's pressure and amit research suggesting cancer side effect.  He changed to Pepcid and finally Prilosec at night. [name] -MD [name] dx him finally with LPR , Laryngo- Pharyngeal Reflux , and he breathes now very well. occasional migraine.  Strong visual distortions- central visual field loss, " the  [place] outlook"  Takes imitrex, needs 2 a month.   He also had a vitreous detachment - had one in the other eye while he was in [place]. Not a retinal detachment.  EDS- He is well controlled - on Xyrem - and that at a proud price of 8000 a month, weird dreams and some hypnagogic hallucinations have however still been present. Lots of REM pressure. He is on a very low dose, but is not willing to increase.  He still uses Sonata when travelling time zones- has not done so in 2020.    RV 05-31-2019,  Patient has returned from [place] in December, wife lost sense of smell and taste and still has not recovered. Had diarrhea, nausea and fever, myalgia, cough.  He did not get sick. Has not tested positive for Covid- no tests at the time.  Continues on his medications. Refills needed. Had recent labs with PCP.    UPDATE 11/23/2018 [name], 76 year old male returns for follow-up with history of narcolepsy no cataplexy.  He has been doing well he takes Xyrem twice nightly except maybe 1 night a week he does not get in the second dose.  ESS is 5 recent labs performed by primary care in January to include CBC CMP hemoglobin A1c and lipid panel reported to be within normal limits.  I do not have access to that information.  Patient had right vitreous detachment while traveling in [place] in November.  He returns for reevaluation.  He also has Sonata to take when he is traveling on a as needed  basis.  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  He has been doing well.  He continues to take Xyrem at night sometimes he does not get in the second dose.  ESS score 7.  He has not had recent labs.  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 I have the pleasure of seeing Dr.Rhoadstoday on 02 September 2017 ina routine revisit to refill hisXYREM. He had recent labs drawn with his primary care physician on 14 December, [name], and except for an elevated LDL and HDL-transaminases are normal, TSH was normal range, MPV is low at 6.7 ALP was low at 35, creatinine has always been elevated and remains at 1.4 with an estimated glomerular filtration rate of 50.   02/10/2017 Dr [name] is doing well, He continues to take 2 doses of Xyrem at nighttime but at a low dose. Xyrem has helped to achieve a higher quality of sleep, less fragmented sleep. But the lower dose has not allowed him to skip a dose without feeling sleepy again. The patient also had noticed a decrease in headache frequency after he achieves better sleep. Since he has been treated with Valtrex for herpetic whitlow he noted further decrease in headache frequency and intensity and overall at the level of energy is well he feels cognitively brighter and he has not experienced vertigo in a while He continues to use valtrex, has had a herpetic finger infection   REVIEW OF SYSTEMS: Out of a complete 14 system review of symptoms, the patient complains only of the following symptoms, and all other reviewed systems are negative.  See HPI  ALLERGIES: Allergies  Allergen Reactions  . Azithromycin Photosensitivity  . Tetracyclines & Related     photosensitivity - can take medication but just must avoid being out in the sun   . Doxycycline Photosensitivity  . Dulcolax Stool Softener [Dss] Nausea And Vomiting    HOME MEDICATIONS: Outpatient Medications Prior to Visit  Medication Sig Dispense Refill  . apixaban (ELIQUIS) 5 MG TABS tablet Take 1 tablet (5 mg total) by mouth 2 (two) times daily. 60 tablet 3  . CALCIUM-MAGNESIUM-ZINC PO Take by mouth.    . mometasone (NASONEX) 50 MCG/ACT nasal spray SHAKE LQ AND U 1 SPR IEN BID    . Multiple Vitamin (MULTIVITAMIN) capsule Take 1 capsule by mouth daily.    . multivitamin-lutein  (OCUVITE-LUTEIN) CAPS capsule Take 1 capsule by mouth daily.    . Omega-3 Fatty Acids (FISH OIL) 500 MG CAPS Take by mouth. As directed    . Omeprazole-Sodium Bicarbonate (ZEGERID OTC PO) 2 tablets at bedtime.    . pantoprazole (PROTONIX) 20 MG tablet Take 20 mg by mouth daily.     . SUMAtriptan (IMITREX) 100 MG tablet TAKE 1 TABLET BY MOUTH EVERY 2 HOURS AS NEEDED FOR MIGRAINE. MAY REPEAT IN 2 HOURS IF HEADACHE PERSISTS OR RECURS 10 tablet 3  . valACYclovir (VALTREX) 500 MG tablet Take 500 mg by mouth 3 (three) times a week. Mon, Wed Fri    . XYREM 500 MG/ML SOLN Take 2 grams of xyrem by mouth twice nightly. 270 mL 5   No facility-administered medications prior to visit.    PAST MEDICAL HISTORY: Past Medical History:  Diagnosis Date  . Achilles rupture, left   . Actinic keratoses   . Arthritis   .  Back pain    low  . CHF (congestive heart failure) ([place])   . Colon polyp 2015  . Controlled narcolepsy 08/29/2014  . Diverticulitis   . Dyspnea    on exertion  . Fever blister    episodically  . Generalized headaches   . GERD (gastroesophageal reflux disease)   . Hepatitis A 1985  . Hypersomnia   . Knee injuries 2002   left  . Migraine   . Migraine headache with aura 08/29/2014  . Narcolepsy without cataplexy 04/12/2013    MSLT with  MSL 4.4 minutes.   . Pain    facial trigemonal  . Rotator cuff (capsule) sprain 08/29/2014  . Seborrhea    chronic  . Sinusitis   . Tendinitis    left middle finger    PAST SURGICAL HISTORY: Past Surgical History:  Procedure Laterality Date  . COLONOSCOPY  2011-last   every 5 years  . FINGER SURGERY  2009/2010   trigger finger of left hand    . [place]   right inguinal   . LASIK  1998 or 99  . TONSILLECTOMY AND ADENOIDECTOMY  1960  . TRIGGER FINGER RELEASE  08/31/2012   Procedure: MINOR RELEASE TRIGGER FINGER/A-1 PULLEY;  Surgeon: Cammie Sickle., MD;  Location: [place];  Service: Orthopedics;   Laterality: Right;  right long finger  . VASECTOMY      FAMILY HISTORY: Family History  Problem Relation Age of Onset  . Heart disease Father 69  . Hypertension Father   . Prostate cancer Father   . Cancer Mother        breast,colon,skin  . Heart disease Mother   . CVA Maternal Grandmother   . Microcephaly Maternal Grandmother   . Microcephaly Paternal Grandfather     SOCIAL HISTORY: Social History   Socioeconomic History  . Marital status: Married    Spouse name: [name]  . Number of children: 2  . Years of education: College  . Highest education level: Not on file  Occupational History    Employer: [employer]  Tobacco Use  . Smoking status: Never Smoker  . Smokeless tobacco: Never Used  Vaping Use  . Vaping Use: Never used  Substance and Sexual Activity  . Alcohol use: Yes    Alcohol/week: 7.0 standard drinks    Types: 7 Cans of beer per week    Comment: consumes one beer daily  . Drug use: No  . Sexual activity: Not on file  Other Topics Concern  . Not on file  Social History Narrative   Patient is married [name]) and lives at home with his wife.   Patient has two adult children.   Patient is working full-time.   Patient has a Financial risk analyst, Production manager.   Patient is right-handed.   Patient drinks two cups of coffee in the morning.   Social Determinants of Health   Financial Resource Strain: Not on file  Food Insecurity: Not on file  Transportation Needs: Not on file  Physical Activity: Not on file  Stress: Not on file  Social Connections: Not on file  Intimate Partner Violence: Not on file      PHYSICAL EXAM Generalized: Well developed, in no acute distress   Neurological examination  Mentation: Alert oriented to time, place, history taking. Follows all commands speech and language fluent Cranial nerve II-XII:Extraocular movements were full. Facial symmetry noted. uvula tongue midline. Head turning and shoulder shrug  were  normal and  symmetric. Motor: Good strength throughout subjectively per patient Sensory: Sensory testing is intact to soft touch on all 4 extremities subjectively per patient Coordination: Cerebellar testing reveals good finger-nose-finger  Gait and station: Patient is able to stand from a seated position. gait is normal.  Reflexes: UTA  DIAGNOSTIC DATA (LABS, IMAGING, TESTING) - I reviewed patient records, labs, notes, testing and imaging myself where available.  Lab Results  Component Value Date   WBC 4.8 10/31/2020   HGB 14.7 10/31/2020   HCT 42.8 10/31/2020   MCV 89.2 10/31/2020   PLT 245 10/31/2020      Component Value Date/Time   NA 135 11/13/2020 0853   NA 140 06/05/2020 1511   K 4.5 11/13/2020 0853   CL 102 11/13/2020 0853   CO2 25 11/13/2020 0853   GLUCOSE 113 (H) 11/13/2020 0853   BUN 12 11/13/2020 0853   BUN 15 06/05/2020 1511   CREATININE 1.22 11/13/2020 0853   CALCIUM 9.4 11/13/2020 0853   PROT 6.6 06/05/2020 1511   ALBUMIN 4.4 06/05/2020 1511   AST 22 06/05/2020 1511   ALT 22 06/05/2020 1511   ALKPHOS 54 06/05/2020 1511   BILITOT 0.3 06/05/2020 1511   GFRNONAA >60 11/13/2020 0853   GFRAA 57 (L) 06/05/2020 1511   Lab Results  Component Value Date   CHOL 211 (H) 10/31/2020   HDL 52 10/31/2020   LDLCALC 141 (H) 10/31/2020   TRIG 88 10/31/2020   CHOLHDL 4.1 10/31/2020     ASSESSMENT AND PLAN 75 y.o. year old male  has a past medical history of Achilles rupture, left, Actinic keratoses, Arthritis, Back pain, CHF (congestive heart failure) ([place]), Colon polyp (2015), Controlled narcolepsy (08/29/2014), Diverticulitis, Dyspnea, Fever blister, Generalized headaches, GERD (gastroesophageal reflux disease), Hepatitis A (1985), Hypersomnia, Knee injuries (2002), Migraine, Migraine headache with aura (08/29/2014), Narcolepsy without cataplexy (04/12/2013), Pain, Rotator cuff (capsule) sprain (08/29/2014), Seborrhea, Sinusitis, and Tendinitis. here with:  1.   Narcolepsy  --Continue Xyrem 2 mg twice at night --Reviewed recent blood work  2.  Migraine headaches   --Continue to use Imitrex for abortive therapy  Advised if symptoms worsen or he develops new symptoms he should let us know.  Follow-up in 6 months or sooner if needed   I spent 20 minutes of face-to-face and non-face-to-face time with patient.  This included previsit chart review, lab review, study review, order entry, electronic health record documentation, patient education.    Ward Givens, MSN, NP-C 12/08/2020, 3:04 PM [facility] [address] [place], [place] [address] [phone]

## 2020-12-11 DIAGNOSIS — L814 Other melanin hyperpigmentation: Secondary | ICD-10-CM | POA: Diagnosis not present

## 2020-12-11 DIAGNOSIS — L821 Other seborrheic keratosis: Secondary | ICD-10-CM | POA: Diagnosis not present

## 2020-12-11 DIAGNOSIS — D225 Melanocytic nevi of trunk: Secondary | ICD-10-CM | POA: Diagnosis not present

## 2020-12-11 DIAGNOSIS — D2262 Melanocytic nevi of left upper limb, including shoulder: Secondary | ICD-10-CM | POA: Diagnosis not present

## 2020-12-11 DIAGNOSIS — L570 Actinic keratosis: Secondary | ICD-10-CM | POA: Diagnosis not present

## 2020-12-11 DIAGNOSIS — D1801 Hemangioma of skin and subcutaneous tissue: Secondary | ICD-10-CM | POA: Diagnosis not present

## 2020-12-11 DIAGNOSIS — L820 Inflamed seborrheic keratosis: Secondary | ICD-10-CM | POA: Diagnosis not present

## 2020-12-12 ENCOUNTER — Ambulatory Visit (INDEPENDENT_AMBULATORY_CARE_PROVIDER_SITE_OTHER): Payer: Medicare Other | Admitting: Otolaryngology

## 2020-12-19 DIAGNOSIS — Z23 Encounter for immunization: Secondary | ICD-10-CM | POA: Diagnosis not present

## 2020-12-24 ENCOUNTER — Telehealth: Payer: Self-pay | Admitting: Neurology

## 2020-12-24 NOTE — Telephone Encounter (Signed)
PA approved Effective from 12/24/2020 through 12/24/2021.

## 2020-12-24 NOTE — Telephone Encounter (Signed)
PA submitted through [id number]/[id number] Can take up to 3 days for response

## 2021-01-01 ENCOUNTER — Other Ambulatory Visit (HOSPITAL_COMMUNITY): Payer: Self-pay | Admitting: Cardiology

## 2021-01-02 DIAGNOSIS — Z8042 Family history of malignant neoplasm of prostate: Secondary | ICD-10-CM | POA: Diagnosis not present

## 2021-01-02 DIAGNOSIS — N529 Male erectile dysfunction, unspecified: Secondary | ICD-10-CM | POA: Diagnosis not present

## 2021-01-02 DIAGNOSIS — K219 Gastro-esophageal reflux disease without esophagitis: Secondary | ICD-10-CM | POA: Diagnosis not present

## 2021-01-02 DIAGNOSIS — Z79899 Other long term (current) drug therapy: Secondary | ICD-10-CM | POA: Diagnosis not present

## 2021-01-02 DIAGNOSIS — I4891 Unspecified atrial fibrillation: Secondary | ICD-10-CM | POA: Diagnosis not present

## 2021-01-02 DIAGNOSIS — Z1389 Encounter for screening for other disorder: Secondary | ICD-10-CM | POA: Diagnosis not present

## 2021-01-02 DIAGNOSIS — Z125 Encounter for screening for malignant neoplasm of prostate: Secondary | ICD-10-CM | POA: Diagnosis not present

## 2021-01-02 DIAGNOSIS — G43109 Migraine with aura, not intractable, without status migrainosus: Secondary | ICD-10-CM | POA: Diagnosis not present

## 2021-01-02 DIAGNOSIS — R0982 Postnasal drip: Secondary | ICD-10-CM | POA: Diagnosis not present

## 2021-01-02 DIAGNOSIS — D6869 Other thrombophilia: Secondary | ICD-10-CM | POA: Diagnosis not present

## 2021-01-02 DIAGNOSIS — M19071 Primary osteoarthritis, right ankle and foot: Secondary | ICD-10-CM | POA: Diagnosis not present

## 2021-01-02 DIAGNOSIS — Z0001 Encounter for general adult medical examination with abnormal findings: Secondary | ICD-10-CM | POA: Diagnosis not present

## 2021-01-02 DIAGNOSIS — G47419 Narcolepsy without cataplexy: Secondary | ICD-10-CM | POA: Diagnosis not present

## 2021-01-02 DIAGNOSIS — B009 Herpesviral infection, unspecified: Secondary | ICD-10-CM | POA: Diagnosis not present

## 2021-01-09 DIAGNOSIS — Z01818 Encounter for other preprocedural examination: Secondary | ICD-10-CM | POA: Diagnosis not present

## 2021-01-09 DIAGNOSIS — M47812 Spondylosis without myelopathy or radiculopathy, cervical region: Secondary | ICD-10-CM | POA: Diagnosis not present

## 2021-01-09 DIAGNOSIS — I480 Paroxysmal atrial fibrillation: Secondary | ICD-10-CM | POA: Diagnosis not present

## 2021-01-09 DIAGNOSIS — G43109 Migraine with aura, not intractable, without status migrainosus: Secondary | ICD-10-CM | POA: Diagnosis not present

## 2021-01-09 DIAGNOSIS — R7989 Other specified abnormal findings of blood chemistry: Secondary | ICD-10-CM | POA: Diagnosis not present

## 2021-01-09 DIAGNOSIS — K219 Gastro-esophageal reflux disease without esophagitis: Secondary | ICD-10-CM | POA: Diagnosis not present

## 2021-01-09 DIAGNOSIS — M19171 Post-traumatic osteoarthritis, right ankle and foot: Secondary | ICD-10-CM | POA: Diagnosis not present

## 2021-01-09 DIAGNOSIS — G47419 Narcolepsy without cataplexy: Secondary | ICD-10-CM | POA: Diagnosis not present

## 2021-01-20 DIAGNOSIS — M19171 Post-traumatic osteoarthritis, right ankle and foot: Secondary | ICD-10-CM | POA: Diagnosis not present

## 2021-01-20 DIAGNOSIS — Z20822 Contact with and (suspected) exposure to covid-19: Secondary | ICD-10-CM | POA: Diagnosis not present

## 2021-01-21 DIAGNOSIS — M24271 Disorder of ligament, right ankle: Secondary | ICD-10-CM | POA: Diagnosis not present

## 2021-01-21 DIAGNOSIS — K219 Gastro-esophageal reflux disease without esophagitis: Secondary | ICD-10-CM | POA: Diagnosis not present

## 2021-01-21 DIAGNOSIS — M25571 Pain in right ankle and joints of right foot: Secondary | ICD-10-CM | POA: Diagnosis not present

## 2021-01-21 DIAGNOSIS — I480 Paroxysmal atrial fibrillation: Secondary | ICD-10-CM | POA: Diagnosis not present

## 2021-01-21 DIAGNOSIS — M25771 Osteophyte, right ankle: Secondary | ICD-10-CM | POA: Diagnosis not present

## 2021-01-21 DIAGNOSIS — M19171 Post-traumatic osteoarthritis, right ankle and foot: Secondary | ICD-10-CM | POA: Diagnosis not present

## 2021-01-21 DIAGNOSIS — G8918 Other acute postprocedural pain: Secondary | ICD-10-CM | POA: Diagnosis not present

## 2021-01-22 ENCOUNTER — Telehealth: Payer: Self-pay | Admitting: Adult Health

## 2021-01-22 DIAGNOSIS — M19171 Post-traumatic osteoarthritis, right ankle and foot: Secondary | ICD-10-CM | POA: Diagnosis not present

## 2021-01-22 DIAGNOSIS — I480 Paroxysmal atrial fibrillation: Secondary | ICD-10-CM | POA: Diagnosis not present

## 2021-01-22 DIAGNOSIS — K219 Gastro-esophageal reflux disease without esophagitis: Secondary | ICD-10-CM | POA: Diagnosis not present

## 2021-01-22 DIAGNOSIS — M25771 Osteophyte, right ankle: Secondary | ICD-10-CM | POA: Diagnosis not present

## 2021-01-22 NOTE — Telephone Encounter (Signed)
LMVM for him but CB/RN is speaking to pt.

## 2021-01-22 NOTE — Telephone Encounter (Signed)
Called the patient back.  Patient is on the immediate release form of oxycodone.  Informed the patient that Dr. Rexene Alberts reviewed this information and recommends that with the IR form of oxycodone should not be taken 4 hours prior to the first Xyrem dose.  The patient is concerned that he is going to need the pain medication for a couple of days around-the-clock which would mean he would be taking the medication every 4-6 hours.  After further discussion we will hold the Xyrem during the time he is needing the medication around the clock. Once he is not requiring it that often he will resume the xyrem Advised the patient per previous discussions with Dr Dohmeier she has been ok with patients stopping the medication abruptly and didn't feel that would be harmful. Advised the patient that I would document this plan and that if he had further questions or concerns to contact us.

## 2021-01-22 NOTE — Telephone Encounter (Signed)
I called pt back.  He relayed he had surgery: ankle replacement on May 11.  Had a nerve block and this will wear off Friday or Saturday.  His question can he take oxycodone along with the Xyrem that he take 2GM at 2230 and again at 0130 and questioning how to take the medication or should he stop the Xyrem and just only take pain medication?   He has been in touch with the REMS/Xyrem also with the surgeons and no one really has any recommendations other both are sedating medications , and to watch respiratory rate was the only thing that was mentioned.  I told him Dr. Brett Fairy was out of the office but would be glad to check and see what I can find out for him. I relayed may reach out to local pharmacy for drug interactions/reccs too.

## 2021-01-22 NOTE — Telephone Encounter (Signed)
Would recommend he check with his pharmacist about interactions.  If he is on a long-acting oxycodone, I would recommend 6 hours between the last oxycodone and the first Xyrem dose at night.  If he is on a short acting oxycodone I would recommend 4 hours between the last oxycodone and the first dose of Xyrem.  I will copy Dr. Brett Fairy so she can review upon her return and make additional recommendations as needed.

## 2021-01-22 NOTE — Telephone Encounter (Signed)
Pt called wanting to discuss medication interaction with the RN. Please advise.

## 2021-02-05 DIAGNOSIS — M19171 Post-traumatic osteoarthritis, right ankle and foot: Secondary | ICD-10-CM | POA: Diagnosis not present

## 2021-03-05 DIAGNOSIS — M19171 Post-traumatic osteoarthritis, right ankle and foot: Secondary | ICD-10-CM | POA: Diagnosis not present

## 2021-03-05 DIAGNOSIS — M7989 Other specified soft tissue disorders: Secondary | ICD-10-CM | POA: Diagnosis not present

## 2021-03-05 DIAGNOSIS — Z96661 Presence of right artificial ankle joint: Secondary | ICD-10-CM | POA: Diagnosis not present

## 2021-04-03 ENCOUNTER — Other Ambulatory Visit: Payer: Self-pay | Admitting: Neurology

## 2021-04-13 ENCOUNTER — Other Ambulatory Visit: Payer: Self-pay | Admitting: Neurology

## 2021-04-15 DIAGNOSIS — Z96661 Presence of right artificial ankle joint: Secondary | ICD-10-CM | POA: Diagnosis not present

## 2021-04-15 DIAGNOSIS — M19171 Post-traumatic osteoarthritis, right ankle and foot: Secondary | ICD-10-CM | POA: Diagnosis not present

## 2021-04-21 ENCOUNTER — Other Ambulatory Visit: Payer: Self-pay | Admitting: Neurology

## 2021-04-21 DIAGNOSIS — G43109 Migraine with aura, not intractable, without status migrainosus: Secondary | ICD-10-CM

## 2021-04-21 DIAGNOSIS — G47411 Narcolepsy with cataplexy: Secondary | ICD-10-CM

## 2021-04-21 DIAGNOSIS — G47419 Narcolepsy without cataplexy: Secondary | ICD-10-CM

## 2021-04-21 MED ORDER — XYREM 500 MG/ML PO SOLN: ORAL | 2 refills | Status: DC

## 2021-04-23 DIAGNOSIS — M25671 Stiffness of right ankle, not elsewhere classified: Secondary | ICD-10-CM | POA: Diagnosis not present

## 2021-04-23 DIAGNOSIS — M25571 Pain in right ankle and joints of right foot: Secondary | ICD-10-CM | POA: Diagnosis not present

## 2021-04-23 DIAGNOSIS — H43813 Vitreous degeneration, bilateral: Secondary | ICD-10-CM | POA: Diagnosis not present

## 2021-04-23 DIAGNOSIS — M6281 Muscle weakness (generalized): Secondary | ICD-10-CM | POA: Diagnosis not present

## 2021-04-23 DIAGNOSIS — H35371 Puckering of macula, right eye: Secondary | ICD-10-CM | POA: Diagnosis not present

## 2021-04-23 DIAGNOSIS — M19171 Post-traumatic osteoarthritis, right ankle and foot: Secondary | ICD-10-CM | POA: Diagnosis not present

## 2021-04-23 DIAGNOSIS — G43809 Other migraine, not intractable, without status migrainosus: Secondary | ICD-10-CM | POA: Diagnosis not present

## 2021-04-23 DIAGNOSIS — H2513 Age-related nuclear cataract, bilateral: Secondary | ICD-10-CM | POA: Diagnosis not present

## 2021-04-30 DIAGNOSIS — M25571 Pain in right ankle and joints of right foot: Secondary | ICD-10-CM | POA: Diagnosis not present

## 2021-04-30 DIAGNOSIS — M19171 Post-traumatic osteoarthritis, right ankle and foot: Secondary | ICD-10-CM | POA: Diagnosis not present

## 2021-04-30 DIAGNOSIS — M25671 Stiffness of right ankle, not elsewhere classified: Secondary | ICD-10-CM | POA: Diagnosis not present

## 2021-04-30 DIAGNOSIS — M6281 Muscle weakness (generalized): Secondary | ICD-10-CM | POA: Diagnosis not present

## 2021-05-07 ENCOUNTER — Other Ambulatory Visit: Payer: Self-pay

## 2021-05-07 ENCOUNTER — Ambulatory Visit (HOSPITAL_COMMUNITY)
Admission: RE | Admit: 2021-05-07 | Discharge: 2021-05-07 | Disposition: A | Discharge status: Home / Self Care | Payer: Medicare Other | Source: Ambulatory Visit | Attending: Cardiology | Admitting: Cardiology

## 2021-05-07 VITALS — BP 118/78 | HR 68 | Wt 164.8 lb

## 2021-05-07 DIAGNOSIS — G47419 Narcolepsy without cataplexy: Secondary | ICD-10-CM | POA: Diagnosis not present

## 2021-05-07 DIAGNOSIS — I4891 Unspecified atrial fibrillation: Secondary | ICD-10-CM | POA: Diagnosis not present

## 2021-05-07 DIAGNOSIS — I480 Paroxysmal atrial fibrillation: Secondary | ICD-10-CM | POA: Insufficient documentation

## 2021-05-07 DIAGNOSIS — Z7951 Long term (current) use of inhaled steroids: Secondary | ICD-10-CM | POA: Insufficient documentation

## 2021-05-07 DIAGNOSIS — Z79899 Other long term (current) drug therapy: Secondary | ICD-10-CM | POA: Insufficient documentation

## 2021-05-07 DIAGNOSIS — K219 Gastro-esophageal reflux disease without esophagitis: Secondary | ICD-10-CM | POA: Diagnosis not present

## 2021-05-07 DIAGNOSIS — R0600 Dyspnea, unspecified: Secondary | ICD-10-CM

## 2021-05-07 DIAGNOSIS — I341 Nonrheumatic mitral (valve) prolapse: Secondary | ICD-10-CM | POA: Diagnosis not present

## 2021-05-07 DIAGNOSIS — Z7901 Long term (current) use of anticoagulants: Secondary | ICD-10-CM | POA: Diagnosis not present

## 2021-05-07 DIAGNOSIS — E785 Hyperlipidemia, unspecified: Secondary | ICD-10-CM | POA: Diagnosis not present

## 2021-05-07 LAB — CBC
HCT: 42 % (ref 39.0–52.0)
Hemoglobin: 14.4 g/dL (ref 13.0–17.0)
MCH: 31 pg (ref 26.0–34.0)
MCHC: 34.3 g/dL (ref 30.0–36.0)
MCV: 90.3 fL (ref 80.0–100.0)
Platelets: 259 10*3/uL (ref 150–400)
RBC: 4.65 MIL/uL (ref 4.22–5.81)
RDW: 13.2 % (ref 11.5–15.5)
WBC: 5.2 10*3/uL (ref 4.0–10.5)
nRBC: 0 % (ref 0.0–0.2)

## 2021-05-07 LAB — BASIC METABOLIC PANEL
Anion gap: 6 (ref 5–15)
BUN: 14 mg/dL (ref 8–23)
CO2: 27 mmol/L (ref 22–32)
Calcium: 9.7 mg/dL (ref 8.9–10.3)
Chloride: 101 mmol/L (ref 98–111)
Creatinine, Ser: 1.23 mg/dL (ref 0.61–1.24)
GFR, Estimated: >60 mL/min (ref >60)
Glucose, Bld: 105 mg/dL — ABNORMAL HIGH (ref 70–99)
Potassium: 4.8 mmol/L (ref 3.5–5.1)
Sodium: 134 mmol/L — ABNORMAL LOW (ref 135–145)

## 2021-05-07 LAB — LIPID PANEL
Cholesterol: 181 mg/dL (ref 0–200)
HDL: 48 mg/dL (ref >40)
LDL Cholesterol: 108 mg/dL — ABNORMAL HIGH (ref 0–99)
Total CHOL/HDL Ratio: 3.8 RATIO
Triglycerides: 123 mg/dL (ref <150)
VLDL: 25 mg/dL (ref 0–40)

## 2021-05-07 MED ORDER — METOPROLOL TARTRATE 25 MG PO TABS: 25.0000 mg | ORAL_TABLET | Freq: Every day | ORAL | 5 refills | Status: DC | PRN

## 2021-05-07 MED ORDER — METOPROLOL TARTRATE 25 MG PO TABS: 25.0000 mg | ORAL_TABLET | Freq: Three times a day (TID) | ORAL | 5 refills | Status: DC | PRN

## 2021-05-07 NOTE — Patient Instructions (Signed)
EKG done today.  Labs done today. We will contact you only if your labs are abnormal.  START Metoprolol '25mg'$  (1 tablet) by mouth daily every 8 hours as needed for PVCs  No other medication changes were made. Please continue all current medications as prescribed.  Your physician recommends that you schedule a follow-up appointment in: 6 months. Please contact our office in January to schedule a February appointment.  If you have any questions or concerns before your next appointment please send [place] a message through [place] or call our office at [phone].    TO LEAVE A MESSAGE FOR THE NURSE SELECT OPTION 2, PLEASE LEAVE A MESSAGE INCLUDING: YOUR NAME DATE OF BIRTH CALL BACK NUMBER REASON FOR CALL**this is important as we prioritize the call backs  YOU WILL RECEIVE A CALL BACK THE SAME DAY AS LONG AS YOU CALL BEFORE 4:00 PM   Do the following things EVERYDAY: Weigh yourself in the morning before breakfast. Write it down and keep it in a log. Take your medicines as prescribed Eat low salt foods--Limit salt (sodium) to 2000 mg per day.  Stay as active as you can everyday Limit all fluids for the day to less than 2 liters   At the [facility], you and your health needs are our priority. As part of our continuing mission to provide you with exceptional heart care, we have created designated Provider Care Teams. These Care Teams include your primary Cardiologist (physician) and Advanced Practice Providers (APPs- Physician Assistants and Nurse Practitioners) who all work together to provide you with the care you need, when you need it.   You may see any of the following providers on your designated Care Team at your next follow up: Dr Glori Bickers Dr Haynes Kerns, NP Lyda Jester, [place] Audry Riles, PharmD   Please be sure to bring in all your medications bottles to every appointment.

## 2021-05-07 NOTE — Progress Notes (Signed)
PCP: [name], MD Cardiology: Dr. Aundra Dubin  75 yo with history of narcolepsy and laryngopharyngeal reflux presents for followup of atrial fibrillation.  In early 2020, he developed dyspnea, a sensation that he was "breathing through a straw." He had an extensive workup in 2020 with a normal ETT, normal PFTs, and normal high resolution CT chest.  Ultimately, he was diagnosed with laryngopharyngeal reflux. Symptoms were improved considerably with Protonix + Zegerid.    With symptoms of LPR controlled, he did not have any exertional dyspnea or chest pain. In 11/21, however, he cut back on Protonix and Zegerid, and dyspnea returned.  In addition, on 11/31/21, he developed tachypalpitations.  His Apple Watch showed atrial fibrillation. He brought the tracings to show me, they were definitely atrial fibrillation. This lasted for a few hours when resolved.  In hindsight, in 4/20, he had a very similar episode of tachypalpitations that was likely atrial fibrillation as well.  He has increased his Protonix and Zegerid again and dyspnea has resolved.  HE had atrial fibrillation again on 08/30/20 documented by Apple Watch.  He does not have OSA symptoms and is followed by Dr. Brett Fairy for narcolepsy, which is controlled.    Echo was done in 12/21, EF 55-60%, mild mitral valve prolapse with probably mild MR.    Coronary CTA was done in 3/22.  This was a difficult study but calcium score 0 and no evidence for significant disease.  CT neck showed asymmetric right vallecula enlargement.  He saw ENT with examination that showed no evidence for malignancy.  He had total ankle replacement in 5/22.   He is now out of the boot and walking, continues to do PT for his ankle.  Since I last saw him, he has had 2 episodes of atrial fibrillation which only lasted about 2 hrs.  He gets symptomatic palpitations when in AF.  No chest pain or exertional dyspnea.  Overall doing well.   ECG (personally reviewed): NSR,  normal  Labs (9/21): K 4.7, creatinine 1.42, TSH normal, hgb 14.2 Labs (12/21): BNP 17 Labs (2/22): LDL 141 Labs (5/22): K 4.5, creatinine 1.3, hgb 11.1  PMH: 1. HAV in 1985 2. Narcolepsy 3. Laryngopharyngeal reflux 4. Atrial fibrillation: Paroxysmal.  5. ETT (10/20): 11.6 METS, no ECG changes => normal study.  6. PFTs (10/20): Normal.  7. High resolution CT chest: Interstitial lung disease.  8. Mitral regurgitation: Echo (12/21) with EF 55-60%, mild mitral valve prolapse with probable mild MR 9. Coronary CTA (3/22): Calcium score 0, no evidence for significant CAD.  10. S/p total ankle replacement in 5/22.   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   FH: No atrial fibrillation.  Father with "bad heart valve."   ROS: All systems reviewed and negative except as per HPI.   Current Outpatient Medications  Medication Sig Dispense Refill   CALCIUM-MAGNESIUM-ZINC PO Take by mouth.     ELIQUIS 5 MG TABS tablet TAKE 1 TABLET(5 MG) BY MOUTH TWICE DAILY 60 tablet 3   mometasone (NASONEX) 50 MCG/ACT nasal spray SHAKE LQ AND U 1 SPR IEN BID     Multiple Vitamin (MULTIVITAMIN) capsule Take 1 capsule by mouth daily.     multivitamin-lutein (OCUVITE-LUTEIN) CAPS capsule Take 1 capsule by mouth daily.     Omeprazole-Sodium Bicarbonate (ZEGERID OTC PO) 2 tablets at bedtime.     pantoprazole (PROTONIX) 20 MG tablet Take 20 mg by mouth daily.      SUMAtriptan (IMITREX) 100 MG  tablet TAKE 1 TABLET BY MOUTH EVERY 2 HOURS AS NEEDED FOR MIGRAINE. MAY REPEAT IN 2 HOURS IF HEADACHE PERSISTS OR RECURS. 10 tablet 3   valACYclovir (VALTREX) 500 MG tablet Take 500 mg by mouth 3 (three) times a week. Mon, Wed Fri     VITAMIN D PO Take 1 tablet by mouth daily in the afternoon.     XYREM 500 MG/ML SOLN Take 2 grams of xyrem by mouth twice nightly. 270 mL 2   metoprolol tartrate (LOPRESSOR) 25 MG tablet Take 1 tablet (25 mg total) by mouth every 8 (eight) hours as needed.  For a fib 30 tablet 5   No current facility-administered medications for this encounter.   BP 118/78   Pulse 68   Wt 74.8 kg (164 lb 12.8 oz)   SpO2 97%   BMI 25.06 kg/m  General: NAD Neck: No JVD, no thyromegaly or thyroid nodule.  Lungs: Clear to auscultation bilaterally with normal respiratory effort. CV: Nondisplaced PMI.  Heart regular S1/S2, no S3/S4, 1/6 HSM apex.  No peripheral edema.  No carotid bruit.  Normal pedal pulses.  Abdomen: Soft, nontender, no hepatosplenomegaly, no distention.  Skin: Intact without lesions or rashes.  Neurologic: Alert and oriented x 3.  Psych: Normal affect. Extremities: No clubbing or cyanosis.  HEENT: Normal.   Assessment/plan: 1. Atrial fibrillation: Paroxysmal, rare episodes.  CHADSVASC 1 (age).  No OSA symptoms (followed by Dr. Brett Fairy already for narcolepsy).  Not a heavy drinker.  Weight is ideal.  Currently in NSR. TSH normal.  Echo showed mitral valve prolapse with mild MR, normal EF 55-60% in 12/21.  - With CHADSVASC 1, anticoagulation not strictly indicated but can be considered if bleeding risk is low.  I think that his bleeding risk is minimal.  He is now on Eliquis 5 mg bid.  - If AF becomes more frequent, I think that he would be a good ablation candidate.  - I will give him metoprolol tartrate to use 25 mg prn recurrent atrial fibrillation.  2. Dyspnea: This seems to be due to laryngopharyngeal reflux, controlled by Protonix and Zegerid.  As above, normal ETT in 10/20 along with normal PFTs and high resolution CT chest. Symptoms minimal currently.  3.  Chest pain: Negative coronary CTA in 3/22, no further chest pain.   Followup 6 months.   Loralie Champagne 05/07/2021

## 2021-05-08 DIAGNOSIS — M19171 Post-traumatic osteoarthritis, right ankle and foot: Secondary | ICD-10-CM | POA: Diagnosis not present

## 2021-05-08 DIAGNOSIS — M6281 Muscle weakness (generalized): Secondary | ICD-10-CM | POA: Diagnosis not present

## 2021-05-08 DIAGNOSIS — M25671 Stiffness of right ankle, not elsewhere classified: Secondary | ICD-10-CM | POA: Diagnosis not present

## 2021-05-08 DIAGNOSIS — M25571 Pain in right ankle and joints of right foot: Secondary | ICD-10-CM | POA: Diagnosis not present

## 2021-05-14 DIAGNOSIS — M19171 Post-traumatic osteoarthritis, right ankle and foot: Secondary | ICD-10-CM | POA: Diagnosis not present

## 2021-05-14 DIAGNOSIS — M25671 Stiffness of right ankle, not elsewhere classified: Secondary | ICD-10-CM | POA: Diagnosis not present

## 2021-05-14 DIAGNOSIS — M25571 Pain in right ankle and joints of right foot: Secondary | ICD-10-CM | POA: Diagnosis not present

## 2021-05-14 DIAGNOSIS — M6281 Muscle weakness (generalized): Secondary | ICD-10-CM | POA: Diagnosis not present

## 2021-06-04 ENCOUNTER — Other Ambulatory Visit: Payer: Self-pay

## 2021-06-04 ENCOUNTER — Ambulatory Visit (INDEPENDENT_AMBULATORY_CARE_PROVIDER_SITE_OTHER): Payer: Medicare Other | Admitting: Otolaryngology

## 2021-06-04 DIAGNOSIS — R040 Epistaxis: Secondary | ICD-10-CM

## 2021-06-04 DIAGNOSIS — M6281 Muscle weakness (generalized): Secondary | ICD-10-CM | POA: Diagnosis not present

## 2021-06-04 DIAGNOSIS — M25671 Stiffness of right ankle, not elsewhere classified: Secondary | ICD-10-CM | POA: Diagnosis not present

## 2021-06-04 DIAGNOSIS — M25571 Pain in right ankle and joints of right foot: Secondary | ICD-10-CM | POA: Diagnosis not present

## 2021-06-04 DIAGNOSIS — M19171 Post-traumatic osteoarthritis, right ankle and foot: Secondary | ICD-10-CM | POA: Diagnosis not present

## 2021-06-04 DIAGNOSIS — Z23 Encounter for immunization: Secondary | ICD-10-CM | POA: Diagnosis not present

## 2021-06-04 NOTE — Progress Notes (Signed)
HPI: NAZARIO RUSSOM is a 75 y.o. male who returns today for evaluation of abnormality noted on a PET scan performed several months ago that shows activity in the right vallecular area.  He was seen 6 months ago with normal examination returns today for follow-up.  He feels like the adenopathy in his neck is gotten smaller. Additionally over the past month or 2 has had recurrent right-sided nosebleeds that have been cauterized previously.  He used to get nosebleeds every 3 to 4 months but more recently has been occurring on a weekly basis.  The location of the bleeding is her right lateral nose.Marland Kitchen  Past Medical History:  Diagnosis Date   Achilles rupture, left    Actinic keratoses    Arthritis    Back pain    low   CHF (congestive heart failure) (HCC)    Colon polyp 2015   Controlled narcolepsy 08/29/2014   Diverticulitis    Dyspnea    on exertion   Fever blister    episodically   Generalized headaches    GERD (gastroesophageal reflux disease)    Hepatitis A 1985   Hypersomnia    Knee injuries 2002   left   Migraine    Migraine headache with aura 08/29/2014   Narcolepsy without cataplexy 04/12/2013    MSLT with  MSL 4.4 minutes.    Pain    facial trigemonal   Rotator cuff (capsule) sprain 08/29/2014   Seborrhea    chronic   Sinusitis    Tendinitis    left middle finger   Past Surgical History:  Procedure Laterality Date   COLONOSCOPY  2011-last   every 5 years   FINGER SURGERY  2009/2010   trigger finger of left hand     [place]   right inguinal    LASIK  1998 or [place] RELEASE  08/31/2012   Procedure: MINOR RELEASE TRIGGER FINGER/A-1 PULLEY;  Surgeon: Cammie Sickle., MD;  Location: [place];  Service: Orthopedics;  Laterality: Right;  right long finger   VASECTOMY     Social History   Socioeconomic History   Marital status: Married    Spouse name: GraceAnn   Number of  children: 2   Years of education: College   Highest education level: Not on file  Occupational History    Employer: [employer]  Tobacco Use   Smoking status: Never   Smokeless tobacco: Never  Vaping Use   Vaping Use: Never used  Substance and Sexual Activity   Alcohol use: Yes    Alcohol/week: 7.0 standard drinks    Types: 7 Cans of beer per week    Comment: consumes one beer daily   Drug use: No   Sexual activity: Not on file  Other Topics Concern   Not on file  Social History Narrative   Patient is married ([place]) and lives at home with his wife.   Patient has two adult children.   Patient is working full-time.   Patient has a Financial risk analyst, Production manager.   Patient is right-handed.   Patient drinks two cups of coffee in the morning.   Social Determinants of Health   Financial Resource Strain: Not on file  Food Insecurity: Not on file  Transportation Needs: Not on file  Physical Activity: Not on file  Stress: Not on file  Social Connections: Not on file   Family History  Problem  Relation Age of Onset   Heart disease Father 51   Hypertension Father    Prostate cancer Father    Cancer Mother        breast,colon,skin   Heart disease Mother    CVA Maternal Grandmother    Microcephaly Maternal Grandmother    Microcephaly Paternal Grandfather    Allergies  Allergen Reactions   Azithromycin Photosensitivity   Tetracyclines & Related     photosensitivity - can take medication but just must avoid being out in the sun    Doxycycline Photosensitivity   Dulcolax Stool Softener [Dss] Nausea And Vomiting   Oxycodone Rash   Prior to Admission medications   Medication Sig Start Date End Date Taking? Authorizing Provider  CALCIUM-MAGNESIUM-ZINC PO Take by mouth.    [provider]  ELIQUIS 5 MG TABS tablet TAKE 1 TABLET(5 MG) BY MOUTH TWICE DAILY 01/06/21   Larey Dresser, MD  metoprolol tartrate (LOPRESSOR) 25 MG tablet Take 1 tablet (25 mg total)  by mouth every 8 (eight) hours as needed. For a fib 05/07/21 05/07/22  Larey Dresser, MD  mometasone (NASONEX) 50 MCG/ACT nasal spray SHAKE LQ AND U 1 SPR IEN BID 01/01/19   [provider]  Multiple Vitamin (MULTIVITAMIN) capsule Take 1 capsule by mouth daily.    [provider]  multivitamin-lutein (OCUVITE-LUTEIN) CAPS capsule Take 1 capsule by mouth daily.    [provider]  Omeprazole-Sodium Bicarbonate (ZEGERID OTC PO) 2 tablets at bedtime. 06/29/19   [provider]  pantoprazole (PROTONIX) 20 MG tablet Take 20 mg by mouth daily.  06/29/19   [provider]  SUMAtriptan (IMITREX) 100 MG tablet TAKE 1 TABLET BY MOUTH EVERY 2 HOURS AS NEEDED FOR MIGRAINE. MAY REPEAT IN 2 HOURS IF HEADACHE PERSISTS OR RECURS. 04/06/21   Dohmeier, Asencion Partridge, MD  valACYclovir (VALTREX) 500 MG tablet Take 500 mg by mouth 3 (three) times a week. Mon, [place] Fri    [provider]  VITAMIN D PO Take 1 tablet by mouth daily in the afternoon.    [provider]  XYREM 500 MG/ML SOLN Take 2 grams of xyrem by mouth twice nightly. 04/21/21   Dohmeier, Asencion Partridge, MD     Positive ROS: Otherwise negative  All other systems have been reviewed and were otherwise negative with the exception of those mentioned in the HPI and as above.  Physical Exam: Constitutional: Alert, well-appearing, no acute distress Ears: External ears without lesions or tenderness. Ear canals are clear bilaterally with intact, clear TMs.  Nasal: External nose without lesions. Septum with minimal deformity. The nosebleed appears to be arising from the right lateral nose at the opening of the nasal vestibule there is a prominent vessel that was cauterized today using silver nitrate. Oral: Lips and gums without lesions. Tongue and palate mucosa without lesions. Posterior oropharynx clear.  Indirect laryngoscopy revealed a clear base of tongue vallecula and epiglottis.  No base of tongue lesions or  masses noted.  Vocal cords are clear piriform sinuses are clear. Neck: No palpable adenopathy or masses.  He has shotty lymphadenopathy all less than a centimeter in size in the supraclavicular area. Respiratory: Breathing comfortably  Skin: No facial/neck lesions or rash noted.  Control of epistaxis  Date/Time: 06/04/2021 5:22 PM Performed by: Rozetta Nunnery, MD Authorized by: Rozetta Nunnery, MD   Consent:    Consent obtained:  Verbal   Consent given by:  Patient Anesthesia:    Anesthesia method:  None Procedure  details:    Treatment site:  R anterior   Treatment method:  Silver nitrate   Treatment complexity:  Limited   Treatment episode: initial   Post-procedure details:    Procedure completion:  Tolerated well, no immediate complications Comments:     Patient had cauterization of a right nasal vessel that was on the lateral portion of the nasal cavity at the opening of the nasal vestibule on the right side.  This was cauterized using silver nitrate.  Assessment: Right-sided epistaxis lateral right nasal cavity in the region of the nasal vestibule  Plan: Reassured him of clear hypopharynx and larynx on indirect laryngoscopy. The area of bleeding on the right nostril was cauterized today using silver nitrate. Reviewed with him concerning using nasal packing with a cottonball and Afrin if he has any further nosebleeds and will follow-up as needed.   Radene Journey, MD

## 2021-06-11 ENCOUNTER — Ambulatory Visit: Payer: Medicare Other | Admitting: Neurology

## 2021-06-11 ENCOUNTER — Other Ambulatory Visit (HOSPITAL_COMMUNITY): Payer: Self-pay

## 2021-06-11 DIAGNOSIS — M19171 Post-traumatic osteoarthritis, right ankle and foot: Secondary | ICD-10-CM | POA: Diagnosis not present

## 2021-06-11 DIAGNOSIS — M6281 Muscle weakness (generalized): Secondary | ICD-10-CM | POA: Diagnosis not present

## 2021-06-11 DIAGNOSIS — M25571 Pain in right ankle and joints of right foot: Secondary | ICD-10-CM | POA: Diagnosis not present

## 2021-06-11 DIAGNOSIS — M25671 Stiffness of right ankle, not elsewhere classified: Secondary | ICD-10-CM | POA: Diagnosis not present

## 2021-06-11 MED ORDER — APIXABAN 5 MG PO TABS: ORAL_TABLET | ORAL | 4 refills | Status: DC

## 2021-06-18 ENCOUNTER — Encounter: Payer: Self-pay | Admitting: Neurology

## 2021-06-18 ENCOUNTER — Ambulatory Visit (INDEPENDENT_AMBULATORY_CARE_PROVIDER_SITE_OTHER): Payer: Medicare Other | Admitting: Neurology

## 2021-06-18 ENCOUNTER — Other Ambulatory Visit: Payer: Self-pay

## 2021-06-18 VITALS — BP 112/73 | HR 61 | Ht 68.0 in | Wt 161.5 lb

## 2021-06-18 DIAGNOSIS — Z7189 Other specified counseling: Secondary | ICD-10-CM

## 2021-06-18 DIAGNOSIS — I480 Paroxysmal atrial fibrillation: Secondary | ICD-10-CM

## 2021-06-18 DIAGNOSIS — G47411 Narcolepsy with cataplexy: Secondary | ICD-10-CM

## 2021-06-18 DIAGNOSIS — G43109 Migraine with aura, not intractable, without status migrainosus: Secondary | ICD-10-CM | POA: Diagnosis not present

## 2021-06-18 DIAGNOSIS — Z23 Encounter for immunization: Secondary | ICD-10-CM | POA: Diagnosis not present

## 2021-06-18 DIAGNOSIS — G47419 Narcolepsy without cataplexy: Secondary | ICD-10-CM | POA: Diagnosis not present

## 2021-06-18 MED ORDER — XYREM 500 MG/ML PO SOLN: ORAL | 2 refills | Status: DC

## 2021-06-18 NOTE — Patient Instructions (Signed)
Sumatriptan Tablets What is this medication? SUMATRIPTAN (soo ma TRIP tan) treats migraines. It works by blocking pain signals and narrowing blood vessels in the brain. It belongs to a group of medications called triptans. It is not used to prevent migraines. This medicine may be used for other purposes; ask your health care provider or pharmacist if you have questions. COMMON BRAND NAME(S): Imitrex, Migraine Pack What should I tell my care team before I take this medication? They need to know if you have any of these conditions: Cigarette smoker Circulation problems in fingers and toes Heart disease High blood pressure High blood sugar (diabetes) High cholesterol History of irregular heartbeat History of stroke Kidney disease Liver disease Stomach or intestine problems An unusual or allergic reaction to sumatriptan, other medications, foods, dyes, or preservatives Pregnant or trying to get pregnant Breast-feeding How should I use this medication? Take this medication by mouth with a glass of water. Follow the directions on the prescription label. Do not take it more often than directed. Talk to your care team regarding the use of this medication in children. Special care may be needed. Overdosage: If you think you have taken too much of this medicine contact a poison control center or emergency room at once. NOTE: This medicine is only for you. Do not share this medicine with others. What if I miss a dose? This does not apply. This medication is not for regular use. What may interact with this medication? Do not take this medication with any of the following: Certain medications for migraine headache like almotriptan, eletriptan, frovatriptan, naratriptan, rizatriptan, sumatriptan, zolmitriptan Ergot alkaloids like dihydroergotamine, ergonovine, ergotamine, methylergonovine MAOIs like Carbex, Eldepryl, Marplan, Nardil, and Parnate This medication may also interact with the  following: Certain medications for depression, anxiety, or psychotic disorders This list may not describe all possible interactions. Give your health care provider a list of all the medicines, herbs, non-prescription drugs, or dietary supplements you use. Also tell them if you smoke, drink alcohol, or use illegal drugs. Some items may interact with your medicine. What should I watch for while using this medication? Visit your care team for regular checks on your progress. Tell your care team if your symptoms do not start to get better or if they get worse. You may get drowsy or dizzy. Do not drive, use machinery, or do anything that needs mental alertness until you know how this medication affects you. Do not stand up or sit up quickly, especially if you are an older patient. This reduces the risk of dizzy or fainting spells. Alcohol may interfere with the effect of this medication. Tell your care team right away if you have any change in your eyesight. If you take migraine medications for 10 or more days a month, your migraines may get worse. Keep a diary of headache days and medication use. Contact your care team if your migraine attacks occur more frequently. What side effects may I notice from receiving this medication? Side effects that you should report to your care team as soon as possible: Allergic reactions-skin rash, itching, hives, swelling of the face, lips, tongue, or throat Burning, pain, tingling, or color changes in the legs or feet Heart attack-pain or tightness in the chest, shoulders, arms, or jaw, nausea, shortness of breath, cold or clammy skin, feeling faint or lightheaded Heart rhythm changes-fast or irregular heartbeat, dizziness, feeling faint or lightheaded, chest pain, trouble breathing Increase in blood pressure Raynaud's-cool, numb, or painful fingers or toes that may change  color from pale, to blue, to red Seizures Serotonin syndrome-irritability, confusion, fast or  irregular heartbeat, muscle stiffness, twitching muscles, sweating, high fever, seizure, chills, vomiting, diarrhea Stroke-sudden numbness or weakness of the face, arm, or leg, trouble speaking, confusion, trouble walking, loss of balance or coordination, dizziness, severe headache, change in vision Sudden or severe stomach pain, nausea, vomiting, fever, or bloody diarrhea Vision loss Side effects that usually do not require medical attention (report to your care team if they continue or are bothersome): Dizziness General discomfort or fatigue This list may not describe all possible side effects. Call your doctor for medical advice about side effects. You may report side effects to FDA at 1-800-FDA-1088. Where should I keep my medication? Keep out of the reach of children and pets. Store at room temperature between 2 and 30 degrees C (36 and 86 degrees F). Throw away any unused medication after the expiration date. NOTE: This sheet is a summary. It may not cover all possible information. If you have questions about this medicine, talk to your doctor, pharmacist, or health care provider.  2022 [employer]/Gold Standard (2020-09-26 12:21:30)

## 2021-06-18 NOTE — Progress Notes (Signed)
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  Had ankle replacement and had questiont to [place] program about opiate pain medication whle continuing XYREM, was not given advice by the pharmacist.  Got a severe itch from codeine and stopped,  now on xyrem again. 5 month post surgery he is still working on ROM. Numbness on the dorsum of the right foot. No need to switch to [facility], no HTN.  He is scheduled for Vaccine omicron today and had flu shot 14 days ago. Has a migraine 1-2 times a month, responds to triptan.  Atrial fib on eloquis. Echo was done in 12/21, EF 55-60%, mild mitral valve prolapse with probably mild MR.     Coronary CTA was done in 3/22.  This was a difficult study but calcium score 0 and no evidence for significant disease.  CT neck showed asymmetric right vallecula enlargement.  He saw ENT with examination that showed no evidence for malignancy.  He had total ankle replacement in 5/22.    He is now out of the boot and walking, continues to do PT for his ankle.  Since I last saw him, he has had 2 episodes of atrial fibrillation which only lasted about 2 hrs.  He gets symptomatic palpitations when in AF.  No chest pain or exertional dyspnea.  Overall doing well.    ECG (personally reviewed): NSR, normal   Labs (9/21): K 4.7, creatinine 1.42, TSH normal, hgb 14.2 Labs (12/21): BNP 17 Labs (2/22): LDL 141 Labs (5/22): K 4.5, creatinine 1.3, hgb 11.1   Diverticulitis in spring 2022, on a journey to [place], Dr Christopher Weber order via telehealth cipro and flagyl.     01-22-2021: [name], 6 months mark.   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  No physical symptoms-he reports that he has not tried to lose weight but over the last 3 years he has lost about 10 pounds slowly, progressively.  He had suffered from laryngeal pharyngeal reflux and lost most of the weight during the pandemic he had the first symptoms by March 2020- after a switch from Zantac ( recall )  to [facility] in January 2020. Has still some headaches , 2 a month - with visual aura.     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  He reports last year he had breathing trouble, felt " upper airway congested" no lung problem. Had PFTs after feeling always congested and short of air-, he had to stop Ranitidine, (Zantac) upon PCP's pressure and amit research suggesting cancer side effect.  He changed to Pepcid and finally Prilosec at night. Christopher Weber -MD Dr. Cristina Weber dx him finally with LPR , Laryngo- Pharyngeal Reflux , and he breathes now very well. occasional migraine.  Strong visual distortions- central visual field loss, " the Picasso outlook"  Takes imitrex, needs 2 a month.   He also had a vitreous detachment - had one in the other eye while he was in [place]. Not a retinal detachment.  EDS- He is well controlled - on Xyrem - and that at a proud price  of 8000 a month, weird dreams and some hypnagogic hallucinations have however still been present. Lots of REM pressure. He is on a very low dose, but is not willing to increase.  He still uses Sonata when travelling time zones- has not done so in 2020.    RV 05-31-2019,  Patient has returned from [place] in December, wife lost sense of smell and taste and still has not recovered. Had diarrhea, nausea and fever, myalgia, cough.  He did not get sick. Has not tested positive for Covid- no tests at the time.  Continues on his medications. Refills needed. Had recent labs with PCP.    UPDATE 11/23/2018 [name], 75 year old male returns for  follow-up with history of narcolepsy no cataplexy.  He has been doing well he takes Xyrem twice nightly except maybe 1 night a week he does not get in the second dose.  ESS is 5 recent labs performed by primary care in January to include CBC CMP hemoglobin A1c and lipid panel reported to be within normal limits.  I do not have access to that information.  Patient had right vitreous detachment while traveling in [place] in November.  He returns for reevaluation.  He also has Sonata to take when he is traveling on a as needed basis.  UPDATE 9/6/2019CM [name] , 75 year old male returns for follow-up with history of narcolepsy cataplexy.  He has been doing well.  He continues to take Xyrem at night sometimes he does not get in the second dose.  ESS score 7.  He has not had recent labs.  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 I have the pleasure of seeing Dr. [name] today on 02 September 2017 ina routine revisit to refill his XYREM.  He had recent labs drawn with his primary care physician on 14 December, Dr. Josetta Weber, and except for an elevated LDL and HDL-transaminases are normal, TSH was normal range, MPV is low at 6.7 ALP was low at 35, creatinine has always been elevated and remains at 1.4 with an estimated glomerular filtration rate of 50.    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 Xyrem has helped to achieve a higher quality of sleep, less fragmented sleep. But the lower dose has not allowed him to skip a dose without feeling sleepy again. The patient also had noticed a decrease in headache frequency after he achieves better sleep. Since he has been treated with Valtrex for herpetic whitlow he noted further decrease in headache frequency and intensity and overall at the level of energy  is well he feels cognitively brighter and he has not experienced vertigo in a while He  continues to use valtrex, has had a herpetic finger infection .      REVIEW OF SYSTEMS: Full 14 system review of systems performed and notable only for those listed, all others are neg:  Sleep : Narcolepsy without cataplexy. Type 2.  Occasional migraine.  Strong visual distortions- central visual field loss, " the Picasso outlook"  Takes imitrex, needs 2 a month.  Valtrex      ALLERGIES: Allergies  Allergen Reactions   Azithromycin Photosensitivity   Tetracyclines & Related     photosensitivity - can take medication but just must avoid being out in the sun  Doxycycline Photosensitivity   Dulcolax Stool Softener [Dss] Nausea And Vomiting   Oxycodone Rash    HOME MEDICATIONS: Outpatient Medications Prior to Visit  Medication Sig Dispense Refill   apixaban (ELIQUIS) 5 MG TABS tablet TAKE 1 TABLET(5 MG) BY MOUTH TWICE DAILY 60 tablet 4   CALCIUM-MAGNESIUM-ZINC PO Take by mouth.     metoprolol tartrate (LOPRESSOR) 25 MG tablet Take 1 tablet (25 mg total) by mouth every 8 (eight) hours as needed. For a fib 30 tablet 5   mometasone (NASONEX) 50 MCG/ACT nasal spray SHAKE LQ AND U 1 SPR IEN BID     Multiple Vitamin (MULTIVITAMIN) capsule Take 1 capsule by mouth daily.     multivitamin-lutein (OCUVITE-LUTEIN) CAPS capsule Take 1 capsule by mouth daily.     Omeprazole-Sodium Bicarbonate (ZEGERID OTC PO) 2 tablets at bedtime.     pantoprazole (PROTONIX) 20 MG tablet Take 20 mg by mouth daily.      SUMAtriptan (IMITREX) 100 MG tablet TAKE 1 TABLET BY MOUTH EVERY 2 HOURS AS NEEDED FOR MIGRAINE. MAY REPEAT IN 2 HOURS IF HEADACHE PERSISTS OR RECURS. 10 tablet 3   valACYclovir (VALTREX) 500 MG tablet Take 500 mg by mouth 3 (three) times a week. Mon, Wed Fri     VITAMIN D PO Take 1 tablet by mouth daily in the afternoon.     XYREM 500 MG/ML SOLN Take 2 grams of xyrem by mouth twice nightly. 270 mL 2   No  facility-administered medications prior to visit.    PAST MEDICAL HISTORY: Past Medical History:  Diagnosis Date   Achilles rupture, left    Actinic keratoses    Arthritis    Back pain    low   Colon polyp 2015   Controlled narcolepsy 08/29/2014   Diverticulitis    Dyspnea    on exertion   Fever blister    episodically   Generalized headaches    GERD (gastroesophageal reflux disease) Laryngo- Pharyngeal reflux-  Dr Christopher Weber, 2021.     Hepatitis A 1985   Hypersomnia    Knee injuries 2002   left   Migraine    Migraine headache with aura 08/29/2014   Narcolepsy without cataplexy 04/12/2013    MSLT with  MSL 4.4 minutes.    Pain    facial trigemonal   Rotator cuff (capsule) sprain 08/29/2014   Seborrhea    chronic   Sinusitis    Tendinitis    left middle finger    PAST SURGICAL HISTORY: Past Surgical History:  Procedure Laterality Date   COLONOSCOPY  2011-last   every 5 years   FINGER SURGERY  2009/2010   trigger finger of left hand     [place]   right inguinal    LASIK  1998 or [place] RELEASE  08/31/2012   Procedure: MINOR RELEASE TRIGGER FINGER/A-1 PULLEY;  Surgeon: Cammie Sickle., MD;  Location: [facility];  Service: Orthopedics;  Laterality: Right;  right long finger   VASECTOMY      FAMILY HISTORY: Family History  Problem Relation Age of Onset   Heart disease Father 63   Hypertension Father    Prostate cancer Father    Cancer Mother        breast,colon,skin   Heart disease Mother    CVA Maternal Grandmother    Microcephaly Maternal Grandmother    Microcephaly Paternal Grandfather     SOCIAL HISTORY: Social  History   Socioeconomic History   Marital status: Married    Spouse name: GraceAnn   Number of children: 2   Years of education: College   Highest education level: Not on file  Occupational History    Employer: [employer]  Tobacco Use    Smoking status: Never   Smokeless tobacco: Never  Vaping Use   Vaping Use: Never used  Substance and Sexual Activity   Alcohol use: Yes    Alcohol/week: 7.0 standard drinks    Types: 7 Cans of beer per week    Comment: consumes one beer daily   Drug use: No   Sexual activity: Not on file  Other Topics Concern   Not on file  Social History Narrative   Patient is married ([place]) and lives at home with his wife.   Patient has two adult children.   Patient is working full-time.   Patient has a Financial risk analyst, Production manager.   Patient is right-handed.   Patient drinks two cups of coffee in the morning.   Social Determinants of Health   Financial Resource Strain: Not on file  Food Insecurity: Not on file  Transportation Needs: Not on file  Physical Activity: Not on file  Stress: Not on file  Social Connections: Not on file  Intimate Partner Violence: Not on file     PHYSICAL EXAM  Vitals:   06/18/21 0843  BP: 112/73  Pulse: 61  Weight: 161 lb 8 oz (73.3 kg)  Height: 5\' 8"  (1.727 m)   Body mass index is 24.56 kg/m.  Generalized: Well developed, in no acute distress  Head: normocephalic and atraumatic,. Oropharynx benign  Neck: Supple,   Musculoskeletal: No deformity  Skin no rash or edema Neurological examination   Mentation: Alert oriented to time, place, history taking. Attention span and concentration appropriate. Recent and remote memory intact.  Follows all commands speech and language fluent.   Cranial nerve : No change in taste and smell, Pupils were equal round reactive to light extraocular movements were full, visual field were full on confrontational test. Facial sensation and strength were normal. hearing was intact to finger rubbing bilaterally. Uvula tongue midline. head turning and shoulder shrug were normal and symmetric.Tongue protrusion into cheek strength was normal. Motor: normal bulk and tone, full strength in the BUE, BLE,  Sensory: normal and  symmetric to light touch,  Coordination: finger-nose-finger, heel-to-shin bilaterally, no dysmetria Gait and Station: Rising up from seated position without assistance, normal stance,  moderate stride, good arm swing, smooth turning, able to perform tiptoe, and heel walking without difficulty. Tandem gait is steady  DIAGNOSTIC DATA (LABS, IMAGING, TESTING)   fully Covid vaccinated. January 10-25.  - I reviewed patient records, labs, notes, testing and imaging myself where available. How likely are you to doze in the following situations: 0 = not likely, 1 = slight chance, 2 = moderate chance, 3 = high chance  Sitting and Reading? Watching Television? Sitting inactive in a public place (theater or meeting)? Lying down in the afternoon when circumstances permit? Sitting and talking to someone? Sitting quietly after lunch without alcohol? In a car, while stopped for a few minutes in traffic? As a passenger in a car for an hour without a break?  Total = 5/ 24       occasional migraine.  Strong visual distortions- central visual field loss, " the Picasso outlook"  Takes imitrex, needs 2 a month.        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  without cataplexy on low dose Xyrem doing well.   Migraine with strong visual aura, more auras than headaches. Has visual  distortions and central blind spot.  Vertigo and headaches improved.    [employer] as needed when traveling overseas  He has been diagnosed with LP reflux- and improved on Prilosec .   Chronic GFR - reduction.  I like to repeat CMET, CBC for Xyrem follow up.    PS : Wife lost smell and taste 08-2018 in [place], he has been tested for Covid four times all negative, now vaccinated.    Plan:   Continue Xyrem at 2.0  gram twice nightly.   Does not need refills, has no HTN and no sodium burden and will not change to [facility].   Labs CBC/ CMP/ hemoglobin A1c and lipids reported as normal at primary care Continue Sonata as needed Follow up 6 months with me or NP.   [name], MD  [facility] [address], [place], [place] [address] [phone]

## 2021-06-19 LAB — COMPREHENSIVE METABOLIC PANEL
ALT: 20 IU/L (ref 0–44)
AST: 23 IU/L (ref 0–40)
Albumin/Globulin Ratio: 2.2 (ref 1.2–2.2)
Albumin: 4.6 g/dL (ref 3.7–4.7)
Alkaline Phosphatase: 64 IU/L (ref 44–121)
BUN/Creatinine Ratio: 13 (ref 10–24)
BUN: 17 mg/dL (ref 8–27)
Bilirubin Total: 0.4 mg/dL (ref 0.0–1.2)
CO2: 25 mmol/L (ref 20–29)
Calcium: 10 mg/dL (ref 8.6–10.2)
Chloride: 99 mmol/L (ref 96–106)
Creatinine, Ser: 1.29 mg/dL — ABNORMAL HIGH (ref 0.76–1.27)
Globulin, Total: 2.1 g/dL (ref 1.5–4.5)
Glucose: 97 mg/dL (ref 70–99)
Potassium: 5.2 mmol/L (ref 3.5–5.2)
Sodium: 137 mmol/L (ref 134–144)
Total Protein: 6.7 g/dL (ref 6.0–8.5)
eGFR: 59 mL/min/{1.73_m2} — ABNORMAL LOW (ref >59)

## 2021-06-19 LAB — CBC
Hematocrit: 41.7 % (ref 37.5–51.0)
Hemoglobin: 14.5 g/dL (ref 13.0–17.7)
MCH: 30.9 pg (ref 26.6–33.0)
MCHC: 34.8 g/dL (ref 31.5–35.7)
MCV: 89 fL (ref 79–97)
Platelets: 285 10*3/uL (ref 150–450)
RBC: 4.69 x10E6/uL (ref 4.14–5.80)
RDW: 13 % (ref 11.6–15.4)
WBC: 4.9 10*3/uL (ref 3.4–10.8)

## 2021-06-21 NOTE — Progress Notes (Signed)
Mildly elevated creatinine , as for many years noted. Consider increasing hydration. Normal sodium level. Normal LFT. Normal CBC

## 2021-06-22 ENCOUNTER — Encounter: Payer: Self-pay | Admitting: Neurology

## 2021-06-22 ENCOUNTER — Telehealth: Payer: Self-pay

## 2021-06-22 NOTE — Telephone Encounter (Signed)
Called and left detailed message of results (ok per DPR). Informed pt to call us back if he has any questions or concerns.

## 2021-06-22 NOTE — Telephone Encounter (Signed)
-----   Message from Wyvonnia Lora, RN sent at 06/22/2021  2:10 PM EDT -----  ----- Message ----- From: [name], MD Sent: 06/21/2021   5:37 PM EDT To: [name], MD, [facility] Results  Mildly elevated creatinine , as for many years noted. Consider increasing hydration. Normal sodium level. Normal LFT. Normal CBC

## 2021-06-22 NOTE — Progress Notes (Signed)
See telephone note from 06/22/21.

## 2021-06-25 ENCOUNTER — Other Ambulatory Visit: Payer: Self-pay

## 2021-06-25 ENCOUNTER — Ambulatory Visit (INDEPENDENT_AMBULATORY_CARE_PROVIDER_SITE_OTHER): Payer: Medicare Other | Admitting: Otolaryngology

## 2021-06-25 DIAGNOSIS — R040 Epistaxis: Secondary | ICD-10-CM | POA: Diagnosis not present

## 2021-06-25 NOTE — Progress Notes (Signed)
HPI: Christopher Weber is a 75 y.o. male who returns today for evaluation of recurrent right-sided epistaxis.Marland Kitchen  He was last seen and cauterized for epistaxis on 06/04/2021.  At that time the area of bleeding was from the lateral nose at the edge of the nasal vestibule just around the edge of the caudal edge of the upper lateral cartilage.  The bleeding is done much better since the cauterization but he still has occasional minor bleeding all from the right side.  Past Medical History:  Diagnosis Date   Achilles rupture, left    Actinic keratoses    Arthritis    Back pain    low   CHF (congestive heart failure) (HCC)    Colon polyp 2015   Controlled narcolepsy 08/29/2014   Diverticulitis    Dyspnea    on exertion   Fever blister    episodically   Generalized headaches    GERD (gastroesophageal reflux disease)    Hepatitis A 1985   Hypersomnia    Knee injuries 2002   left   Migraine    Migraine headache with aura 08/29/2014   Narcolepsy without cataplexy 04/12/2013    MSLT with  MSL 4.4 minutes.    Pain    facial trigemonal   Rotator cuff (capsule) sprain 08/29/2014   Seborrhea    chronic   Sinusitis    Tendinitis    left middle finger   Past Surgical History:  Procedure Laterality Date   COLONOSCOPY  2011-last   every 5 years   FINGER SURGERY  2009/2010   trigger finger of left hand     [place]   right inguinal    LASIK  1998 or [place] RELEASE  08/31/2012   Procedure: MINOR RELEASE TRIGGER FINGER/A-1 PULLEY;  Surgeon: Cammie Sickle., MD;  Location: [facility];  Service: Orthopedics;  Laterality: Right;  right long finger   VASECTOMY     Social History   Socioeconomic History   Marital status: Married    Spouse name: GraceAnn   Number of children: 2   Years of education: College   Highest education level: Not on file  Occupational History    Employer: [employer]   Tobacco Use   Smoking status: Never   Smokeless tobacco: Never  Vaping Use   Vaping Use: Never used  Substance and Sexual Activity   Alcohol use: Yes    Alcohol/week: 7.0 standard drinks    Types: 7 Cans of beer per week    Comment: consumes one beer daily   Drug use: No   Sexual activity: Not on file  Other Topics Concern   Not on file  Social History Narrative   Patient is married ([place]) and lives at home with his wife.   Patient has two adult children.   Patient is working full-time.   Patient has a Financial risk analyst, Production manager.   Patient is right-handed.   Patient drinks two cups of coffee in the morning.   Social Determinants of Health   Financial Resource Strain: Not on file  Food Insecurity: Not on file  Transportation Needs: Not on file  Physical Activity: Not on file  Stress: Not on file  Social Connections: Not on file   Family History  Problem Relation Age of Onset   Heart disease Father 40   Hypertension Father    Prostate cancer Father    Cancer  Mother        breast,colon,skin   Heart disease Mother    CVA Maternal Grandmother    Microcephaly Maternal Grandmother    Microcephaly Paternal Grandfather    Allergies  Allergen Reactions   Azithromycin Photosensitivity   Tetracyclines & Related     photosensitivity - can take medication but just must avoid being out in the sun    Doxycycline Photosensitivity   Dulcolax Stool Softener [Dss] Nausea And Vomiting   Oxycodone Rash   Prior to Admission medications   Medication Sig Start Date End Date Taking? Authorizing Provider  apixaban (ELIQUIS) 5 MG TABS tablet TAKE 1 TABLET(5 MG) BY MOUTH TWICE DAILY 06/11/21   Larey Dresser, MD  CALCIUM-MAGNESIUM-ZINC PO Take by mouth.    [provider]  metoprolol tartrate (LOPRESSOR) 25 MG tablet Take 1 tablet (25 mg total) by mouth every 8 (eight) hours as needed. For a fib 05/07/21 05/07/22  Larey Dresser, MD  mometasone (NASONEX) 50 MCG/ACT nasal  spray SHAKE LQ AND U 1 SPR IEN BID 01/01/19   [provider]  Multiple Vitamin (MULTIVITAMIN) capsule Take 1 capsule by mouth daily.    [provider]  multivitamin-lutein (OCUVITE-LUTEIN) CAPS capsule Take 1 capsule by mouth daily.    [provider]  Omeprazole-Sodium Bicarbonate (ZEGERID OTC PO) 2 tablets at bedtime. 06/29/19   [provider]  pantoprazole (PROTONIX) 20 MG tablet Take 20 mg by mouth daily.  06/29/19   [provider]  SUMAtriptan (IMITREX) 100 MG tablet TAKE 1 TABLET BY MOUTH EVERY 2 HOURS AS NEEDED FOR MIGRAINE. MAY REPEAT IN 2 HOURS IF HEADACHE PERSISTS OR RECURS. 04/06/21   Dohmeier, Asencion Partridge, MD  valACYclovir (VALTREX) 500 MG tablet Take 500 mg by mouth 3 (three) times a week. Mon, [place] Fri    [provider]  VITAMIN D PO Take 1 tablet by mouth daily in the afternoon.    [provider]  XYREM 500 MG/ML SOLN Take 2 grams of xyrem by mouth twice nightly. 06/18/21   Dohmeier, Asencion Partridge, MD     Positive ROS: Otherwise negative  All other systems have been reviewed and were otherwise negative with the exception of those mentioned in the HPI and as above.  Physical Exam: Constitutional: Alert, well-appearing, no acute distress Ears: External ears without lesions or tenderness. Ear canals are clear bilaterally with intact, clear TMs.  Nasal: External nose without lesions. Septum with minimal deformity..  He had a scab on the lateral portion of the nostril at the caudal edge of the upper lateral cartilage.  This was removed and there was a small area of granulation tissue but no active bleeding noted no obvious lesion but I suspect the minor bleeding he has been having was from the area of granulation tissue.  This was recauterized using silver nitrate. Oral: Lips and gums without lesions. Tongue and palate mucosa without lesions. Posterior oropharynx clear. Neck: No palpable adenopathy or masses Respiratory: Breathing  comfortably  Skin: No facial/neck lesions or rash noted.  Control of epistaxis  Date/Time: 06/25/2021 11:06 AM Performed by: Rozetta Nunnery, MD Authorized by: Rozetta Nunnery, MD   Consent:    Consent obtained:  Verbal   Consent given by:  Patient Anesthesia:    Anesthesia method:  Topical application   Topical anesthetic:  Benzocaine spray and lidocaine spray Procedure details:    Treatment site:  R anterior   Treatment method:  Silver nitrate   Treatment complexity:  Limited  Treatment episode: recurring   Post-procedure details:    Procedure completion:  Tolerated well, no immediate complications Comments:     The site of bleeding was lateral nasal valve region at the upper superior aspect of the caudal edge of the upper lateral cartilage.  There is a small area of granulation tissue where he had previously been cauterized.  This was recauterized with silver nitrate..  Assessment: Recurrent right-sided epistaxis  Plan: This was cauterized again with silver nitrate. Mupirocin ointment was applied.  He tolerated this well. Reviewed with him concerning use of cottonball and Afrin if he has any further nosebleeds and he will follow-up as needed.   Radene Journey, MD

## 2021-06-26 DIAGNOSIS — M25671 Stiffness of right ankle, not elsewhere classified: Secondary | ICD-10-CM | POA: Diagnosis not present

## 2021-06-26 DIAGNOSIS — M25371 Other instability, right ankle: Secondary | ICD-10-CM | POA: Diagnosis not present

## 2021-06-26 DIAGNOSIS — M6281 Muscle weakness (generalized): Secondary | ICD-10-CM | POA: Diagnosis not present

## 2021-06-26 DIAGNOSIS — M19171 Post-traumatic osteoarthritis, right ankle and foot: Secondary | ICD-10-CM | POA: Diagnosis not present

## 2021-07-10 DIAGNOSIS — M19171 Post-traumatic osteoarthritis, right ankle and foot: Secondary | ICD-10-CM | POA: Diagnosis not present

## 2021-07-10 DIAGNOSIS — M25571 Pain in right ankle and joints of right foot: Secondary | ICD-10-CM | POA: Diagnosis not present

## 2021-07-10 DIAGNOSIS — M6281 Muscle weakness (generalized): Secondary | ICD-10-CM | POA: Diagnosis not present

## 2021-07-10 DIAGNOSIS — M25671 Stiffness of right ankle, not elsewhere classified: Secondary | ICD-10-CM | POA: Diagnosis not present

## 2021-07-16 DIAGNOSIS — M25371 Other instability, right ankle: Secondary | ICD-10-CM | POA: Diagnosis not present

## 2021-07-16 DIAGNOSIS — M19171 Post-traumatic osteoarthritis, right ankle and foot: Secondary | ICD-10-CM | POA: Diagnosis not present

## 2021-08-13 DIAGNOSIS — K219 Gastro-esophageal reflux disease without esophagitis: Secondary | ICD-10-CM | POA: Diagnosis not present

## 2021-08-13 DIAGNOSIS — K5792 Diverticulitis of intestine, part unspecified, without perforation or abscess without bleeding: Secondary | ICD-10-CM | POA: Diagnosis not present

## 2021-08-13 DIAGNOSIS — Z86010 Personal history of colonic polyps: Secondary | ICD-10-CM | POA: Diagnosis not present

## 2021-08-18 ENCOUNTER — Other Ambulatory Visit: Payer: Self-pay | Admitting: Neurology

## 2021-09-21 IMAGING — CT CT NECK W/ CM
5 of 6 series · 14 of 33 positions shown, 16 images · IV contrast (APPLIED)
Comparison: None.

CLINICAL DATA: Head and neck cancer.  Evaluate for lymphadenopathy.

EXAM:
CT NECK WITH CONTRAST
TECHNIQUE: Multidetector CT imaging of the neck was performed using the
standard protocol following the bolus administration of intravenous
contrast.
CONTRAST:  50mL OMNIPAQUE IOHEXOL 300 MG/ML  SOLN

[Series 3: axial neck · axial · 0.45mm/px · z∈[+1056,+1130]mm · 2 of 112 slices shown, 3 images]
[im 38/112  soft-tissue]
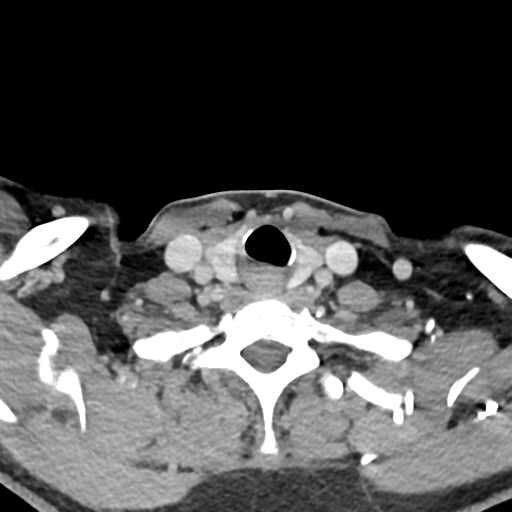
[im 38/112  bone]
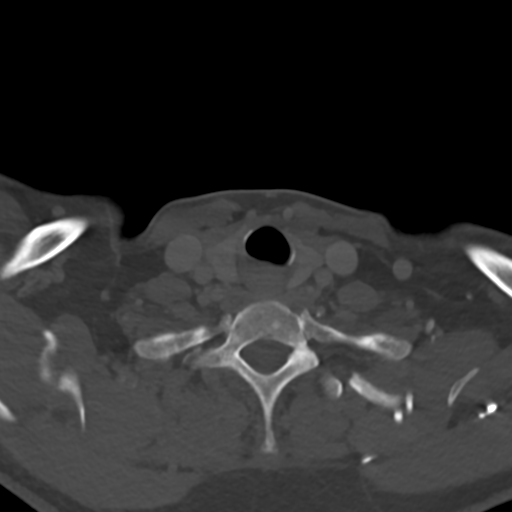
[im 75/112  bone]
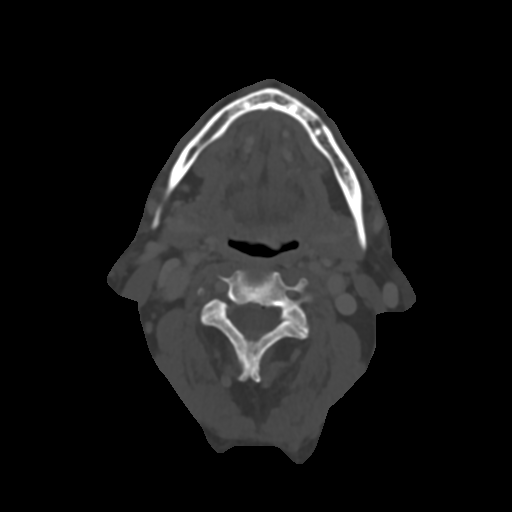

[Series 5: axial bone · axial · 0.45mm/px · z∈[+1056,+1130]mm · 2 of 112 slices shown]
[im 38/112  bone]
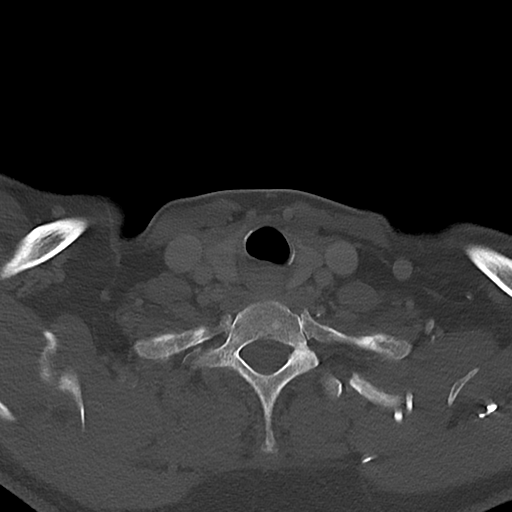
[im 75/112  bone]
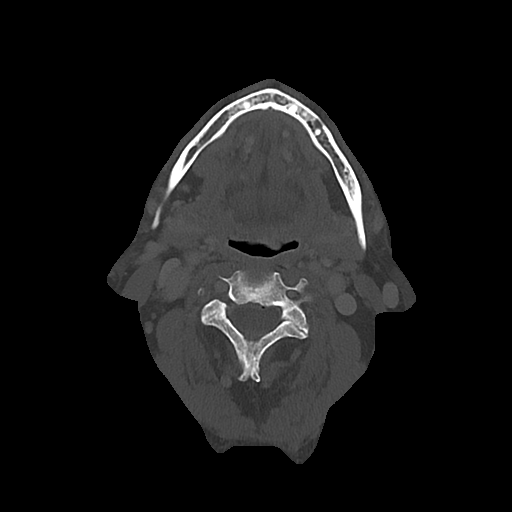

[Series 6: sag neck · sagittal · 0.33mm/px · 5 of 97 slices shown, 6 images]
[im 33/97  bone]
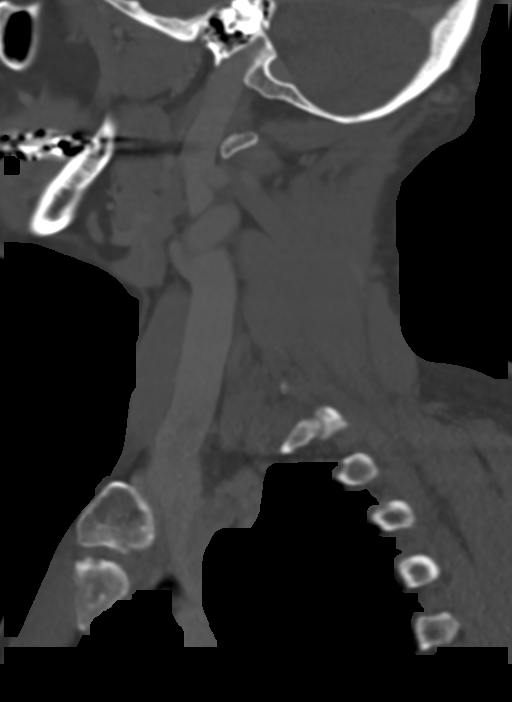
[im 41/97  bone]
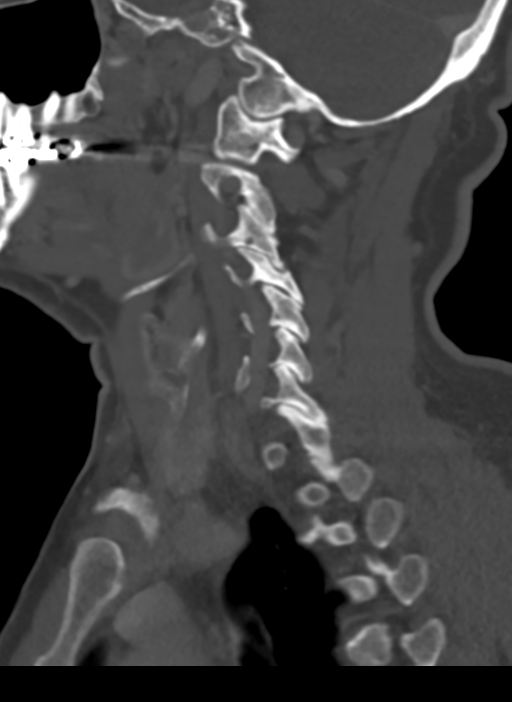
[im 49/97  soft-tissue]
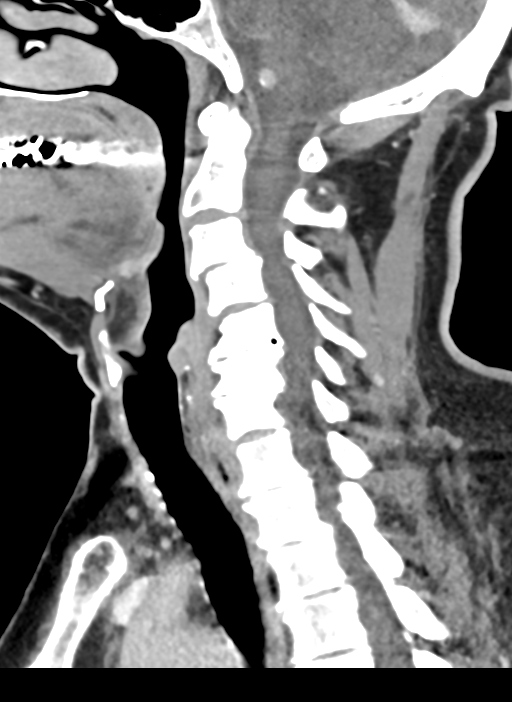
[im 49/97  bone]
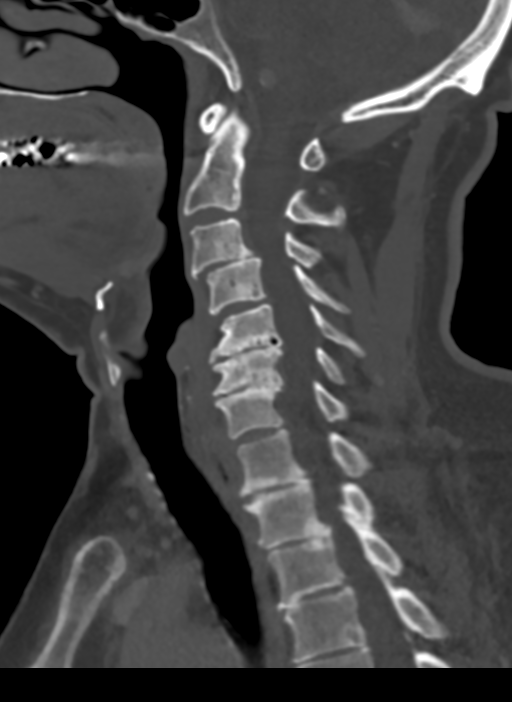
[im 57/97  bone]
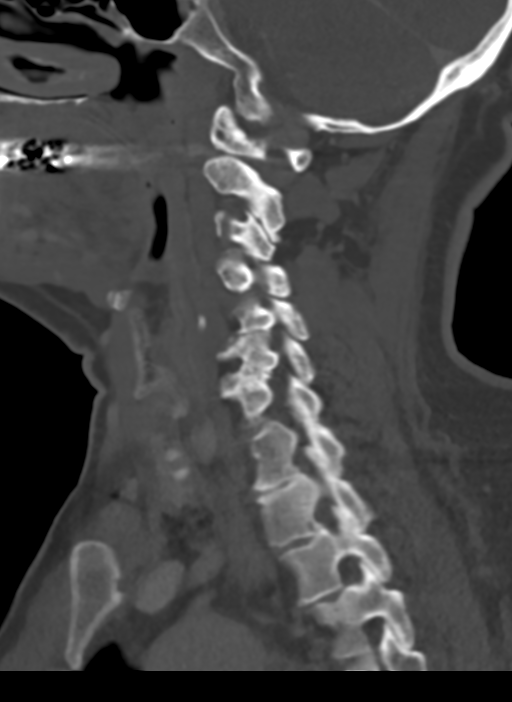
[im 65/97  bone]
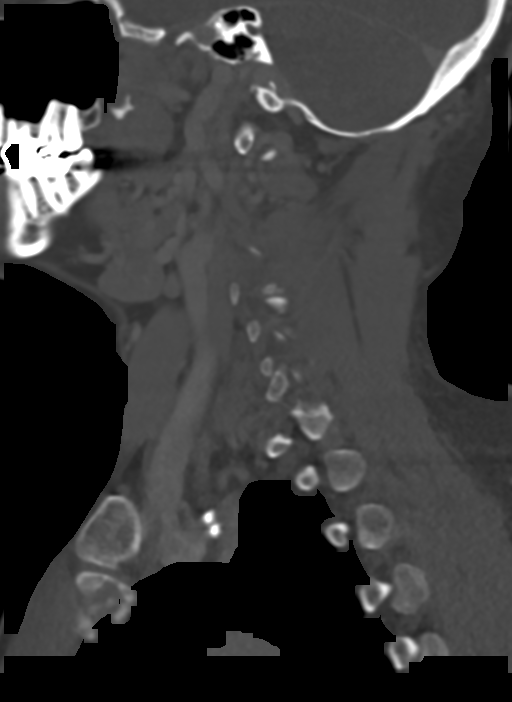

[Series 7: cor neck · coronal · 0.42mm/px · 3 of 133 slices shown]
[im 27/133  bone]
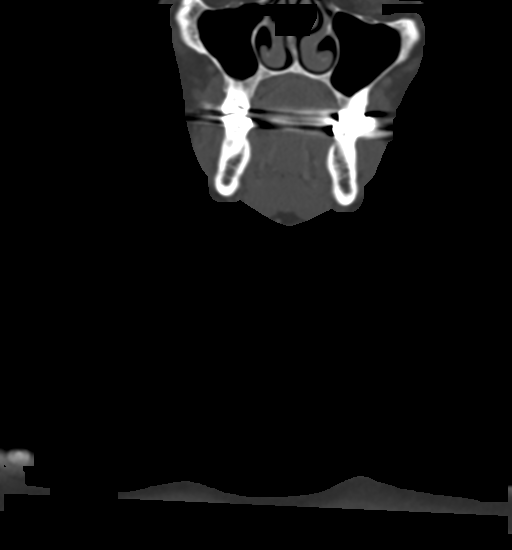
[im 53/133  bone]
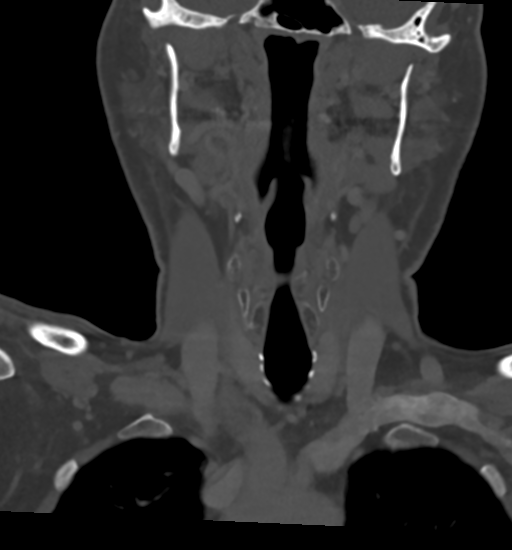
[im 80/133  bone]
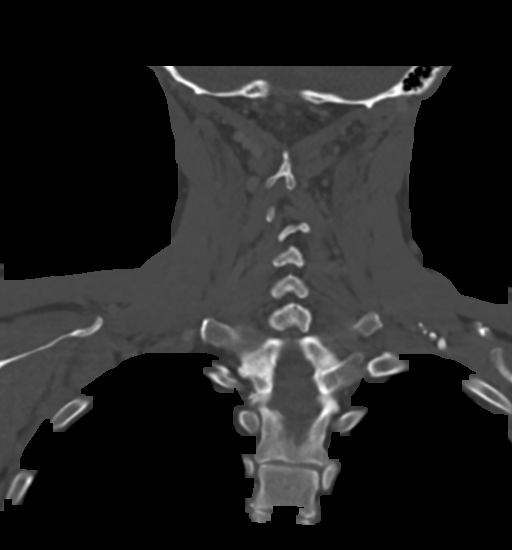

[Series 8: ax oropharynx · axial · 0.38mm/px · z∈[+1047,+1123]mm · 2 of 114 slices shown]
[im 38/114  bone]
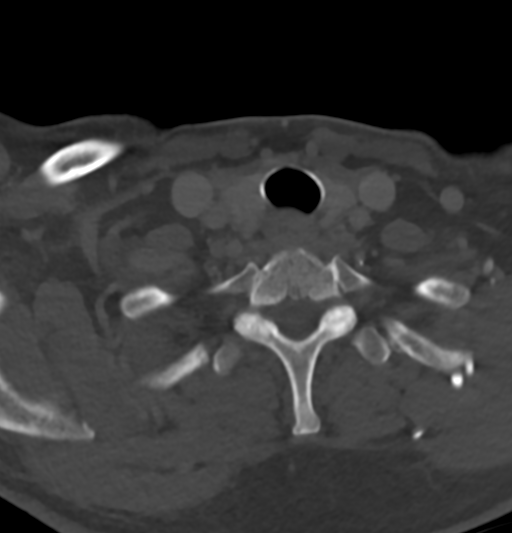
[im 76/114  bone]
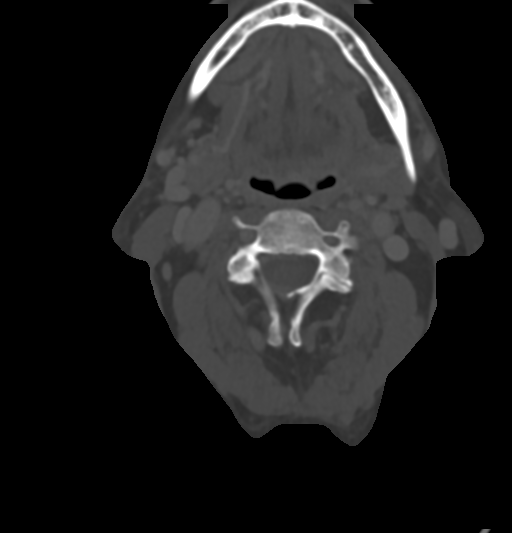

[14 of 33 positions shown; findings below may reference images not displayed]

FINDINGS: Pharynx and larynx: Asymmetric enhancement of the mucosa in the area
of the right vallecula. This appears to infiltrate the submucosa.
Correlate with direct visualization. Remainder of the pharynx is
normal. Vocal cords normal bilaterally.

Salivary glands: No inflammation, mass, or stone.

Thyroid: Negative

Lymph nodes: No enlarged lymph nodes in the neck.

Vascular: Normal vascular enhancement.

Limited intracranial: Negative

Visualized orbits: Not imaged

Mastoids and visualized paranasal sinuses: Negative

Skeleton: Extensive cervical spondylosis. Multilevel disc and facet
degeneration with anterolisthesis C3-4 and C4-5. No fracture or
acute bone lesion.

Upper chest: Lung apices clear bilaterally.

Other: None
IMPRESSION: Asymmetric enhancement of the vallecula on the right. Correlate with
direct visualization and prior cancer history.

No enlarged lymph nodes in the neck

Advanced cervical spondylosis.

## 2021-10-22 ENCOUNTER — Other Ambulatory Visit (HOSPITAL_COMMUNITY): Payer: Self-pay | Admitting: Cardiology

## 2021-11-06 ENCOUNTER — Encounter (HOSPITAL_COMMUNITY): Payer: Self-pay | Admitting: Cardiology

## 2021-11-06 ENCOUNTER — Ambulatory Visit (HOSPITAL_COMMUNITY)
Admission: RE | Admit: 2021-11-06 | Discharge: 2021-11-06 | Disposition: A | Discharge status: Home / Self Care | Payer: Medicare Other | Source: Ambulatory Visit | Attending: Cardiology | Admitting: Cardiology

## 2021-11-06 ENCOUNTER — Other Ambulatory Visit: Payer: Self-pay

## 2021-11-06 VITALS — BP 110/70 | HR 67 | Wt 167.4 lb

## 2021-11-06 DIAGNOSIS — I480 Paroxysmal atrial fibrillation: Secondary | ICD-10-CM | POA: Diagnosis not present

## 2021-11-06 DIAGNOSIS — I4891 Unspecified atrial fibrillation: Secondary | ICD-10-CM

## 2021-11-06 DIAGNOSIS — Z79899 Other long term (current) drug therapy: Secondary | ICD-10-CM | POA: Diagnosis not present

## 2021-11-06 DIAGNOSIS — G47419 Narcolepsy without cataplexy: Secondary | ICD-10-CM | POA: Insufficient documentation

## 2021-11-06 DIAGNOSIS — I340 Nonrheumatic mitral (valve) insufficiency: Secondary | ICD-10-CM | POA: Diagnosis not present

## 2021-11-06 DIAGNOSIS — K219 Gastro-esophageal reflux disease without esophagitis: Secondary | ICD-10-CM | POA: Diagnosis not present

## 2021-11-06 DIAGNOSIS — I341 Nonrheumatic mitral (valve) prolapse: Secondary | ICD-10-CM | POA: Diagnosis not present

## 2021-11-06 DIAGNOSIS — R0600 Dyspnea, unspecified: Secondary | ICD-10-CM | POA: Insufficient documentation

## 2021-11-06 DIAGNOSIS — Z7901 Long term (current) use of anticoagulants: Secondary | ICD-10-CM | POA: Diagnosis not present

## 2021-11-06 LAB — CBC
HCT: 39.9 % (ref 39.0–52.0)
Hemoglobin: 14.3 g/dL (ref 13.0–17.0)
MCH: 31.5 pg (ref 26.0–34.0)
MCHC: 35.8 g/dL (ref 30.0–36.0)
MCV: 87.9 fL (ref 80.0–100.0)
Platelets: 228 10*3/uL (ref 150–400)
RBC: 4.54 MIL/uL (ref 4.22–5.81)
RDW: 13 % (ref 11.5–15.5)
WBC: 4.9 10*3/uL (ref 4.0–10.5)
nRBC: 0 % (ref 0.0–0.2)

## 2021-11-06 NOTE — Patient Instructions (Signed)
Thank you for your visit today.  There has been no changes to your medications.  Labs done today, your results will be available in MyChart, we will contact you for abnormal readings.  Your physician has requested that you have an echocardiogram. Echocardiography is a painless test that uses sound waves to create images of your heart. It provides your doctor with information about the size and shape of your heart and how well your hearts chambers and valves are working. This procedure takes approximately one hour. There are no restrictions for this procedure.  Your physician recommends that you schedule a follow-up appointment in: 6 months ( August 2023) with an echocardiogram ** please call the office in June to arrange your follow up appointment**  If you have any questions or concerns before your next appointment please send [place] a message through [place] or call our office at [phone].    TO LEAVE A MESSAGE FOR THE NURSE SELECT OPTION 2, PLEASE LEAVE A MESSAGE INCLUDING: YOUR NAME DATE OF BIRTH CALL BACK NUMBER REASON FOR CALL**this is important as we prioritize the call backs  YOU WILL RECEIVE A CALL BACK THE SAME DAY AS LONG AS YOU CALL BEFORE 4:00 PM  At the [facility], you and your health needs are our priority. As part of our continuing mission to provide you with exceptional heart care, we have created designated Provider Care Teams. These Care Teams include your primary Cardiologist (physician) and Advanced Practice Providers (APPs- Physician Assistants and Nurse Practitioners) who all work together to provide you with the care you need, when you need it.   You may see any of the following providers on your designated Care Team at your next follow up: Dr Glori Bickers Dr Haynes Kerns, NP Lyda Jester, [place] [facility] [place], [place] Audry Riles, PharmD   Please be sure to bring in all your medications bottles to every  appointment.

## 2021-11-07 NOTE — Progress Notes (Signed)
PCP: [name], MD Cardiology: Dr. Aundra Dubin  76 y.o. with history of narcolepsy and laryngopharyngeal reflux presents for followup of atrial fibrillation.  In early 2020, he developed dyspnea, a sensation that he was "breathing through a straw." He had an extensive workup in 2020 with a normal ETT, normal PFTs, and normal high resolution CT chest.  Ultimately, he was diagnosed with laryngopharyngeal reflux. Symptoms were improved considerably with Protonix + Zegerid.    With symptoms of LPR controlled, he did not have any exertional dyspnea or chest pain. In 11/21, however, he cut back on Protonix and Zegerid, and dyspnea returned.  In addition, on 11/31/21, he developed tachypalpitations.  His Apple Watch showed atrial fibrillation. He brought the tracings to show me, they were definitely atrial fibrillation. This lasted for a few hours when resolved.  In hindsight, in 4/20, he had a very similar episode of tachypalpitations that was likely atrial fibrillation as well.  He has increased his Protonix and Zegerid again and dyspnea has resolved.  HE had atrial fibrillation again on 08/30/20 documented by Apple Watch.  He does not have OSA symptoms and is followed by Dr. Brett Fairy for narcolepsy, which is controlled.    Echo was done in 12/21, EF 55-60%, mild mitral valve prolapse with probably mild MR.    Coronary CTA was done in 3/22.  This was a difficult study but calcium score 0 and no evidence for significant disease.  CT neck showed asymmetric right vallecula enlargement.  He saw ENT with examination that showed no evidence for malignancy.  He had total ankle replacement in 5/22.   Patient had diverticulitis in 9/22, treated as outpatient.   Since last appointment, he has had 1 episode of atrial fibrillation (notes on Apple Watch).  This occurred on 12/26, lasted for about 5 hrs.  He noted palpitations but no dyspnea or chest pain.  Generally, good exercise tolerance.  No exertional dyspnea or  shortness of breath.  He was getting nose bleeds with Eliquis but these have resolved.    ECG (personally reviewed): NSR, LVH  Labs (9/21): K 4.7, creatinine 1.42, TSH normal, hgb 14.2 Labs (12/21): BNP 17 Labs (2/22): LDL 141 Labs (5/22): K 4.5, creatinine 1.3, hgb 11.1 Labs (8/22): LDL 108 Labs (10/22): creatinine 1.29, hgb 14.5  PMH: 1. HAV in 1985 2. Narcolepsy 3. Laryngopharyngeal reflux 4. Atrial fibrillation: Paroxysmal.  5. ETT (10/20): 11.6 METS, no ECG changes => normal study.  6. PFTs (10/20): Normal.  7. High resolution CT chest: Interstitial lung disease.  8. Mitral regurgitation: Echo (12/21) with EF 55-60%, mild mitral valve prolapse with probable mild MR 9. Coronary CTA (3/22): Calcium score 0, no evidence for significant CAD.  10. S/p total ankle replacement in 5/22.  11. 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   FH: No atrial fibrillation.  Father with "bad heart valve."   ROS: All systems reviewed and negative except as per HPI.   Current Outpatient Medications  Medication Sig Dispense Refill   CALCIUM-MAGNESIUM-ZINC PO Take by mouth.     ELIQUIS 5 MG TABS tablet TAKE 1 TABLET(5 MG) BY MOUTH TWICE DAILY 60 tablet 4   metoprolol tartrate (LOPRESSOR) 25 MG tablet Take 1 tablet (25 mg total) by mouth every 8 (eight) hours as needed. For a fib 30 tablet 5   mometasone (NASONEX) 50 MCG/ACT nasal spray Place 2 sprays into the nose as needed.     Multiple Vitamin (MULTIVITAMIN) capsule  Take 1 capsule by mouth daily.     multivitamin-lutein (OCUVITE-LUTEIN) CAPS capsule Take 1 capsule by mouth daily.     Omeprazole-Sodium Bicarbonate (ZEGERID OTC PO) 2 tablets at bedtime.     pantoprazole (PROTONIX) 20 MG tablet Take 20 mg by mouth daily.      SUMAtriptan (IMITREX) 100 MG tablet TAKE 1 TABLET BY MOUTH EVERY 2 HOURS AS NEEDED FOR MIGRAINE. MAY REPEAT IN 2 HOURS IF HEADACHE PERSISTS OR RECURS 10 tablet 4    valACYclovir (VALTREX) 500 MG tablet Take 500 mg by mouth 3 (three) times a week. Mon, Wed Fri     VITAMIN D PO Take 1 tablet by mouth daily in the afternoon.     XYREM 500 MG/ML SOLN Take 2 grams of xyrem by mouth twice nightly. 270 mL 2   No current facility-administered medications for this encounter.   BP 110/70    Pulse 67    Wt 75.9 kg (167 lb 6.4 oz)    SpO2 97%    BMI 25.45 kg/m  General: NAD Neck: No JVD, no thyromegaly or thyroid nodule.  Lungs: Clear to auscultation bilaterally with normal respiratory effort. CV: Nondisplaced PMI.  Heart regular S1/S2, no S3/S4, no murmur.  No peripheral edema.  No carotid bruit.  Normal pedal pulses.  Abdomen: Soft, nontender, no hepatosplenomegaly, no distention.  Skin: Intact without lesions or rashes.  Neurologic: Alert and oriented x 3.  Psych: Normal affect. Extremities: No clubbing or cyanosis.  HEENT: Normal.   Assessment/plan: 1. Atrial fibrillation: Paroxysmal.  CHADSVASC 1 (age).  No OSA symptoms (followed by Dr. Brett Fairy already for narcolepsy).  Not a heavy drinker.  Weight is ideal.  Currently in NSR. TSH normal.  Echo showed mitral valve prolapse with mild MR, normal EF 55-60% in 12/21.  He has had 5 episodes of atrial fibrillation, lasting for a few hours each, over the last 2 years.  - With CHADSVASC 1, anticoagulation not strictly indicated but can be considered if bleeding risk is low.  I think that his bleeding risk is minimal.  He is now on Eliquis 5 mg bid.  - We disussed options for atrial fibrillation => currently rare and minimally symptomatic, he does not want further treatment at this time.  He would be a candidate for pill-in-the-pocket flecainide or atrial fibrillation ablation if episodes become more frequent or symptomatic.  - I will give him metoprolol tartrate to use 25 mg prn recurrent atrial fibrillation.  2. Dyspnea: This seems to be due to laryngopharyngeal reflux, controlled by Protonix and Zegerid.  As above,  normal ETT in 10/20 along with normal PFTs and high resolution CT chest. Symptoms minimal currently.  3.  Chest pain: Negative coronary CTA in 3/22, no further chest pain.  4. Mitral valve prolapse/MR: Mild on last echo.  - Repeat echo in 6 months with followup.   Followup 6 months.   Loralie Champagne 11/07/2021

## 2021-11-24 ENCOUNTER — Other Ambulatory Visit: Payer: Self-pay | Admitting: Neurology

## 2021-11-24 DIAGNOSIS — G47411 Narcolepsy with cataplexy: Secondary | ICD-10-CM

## 2021-11-24 DIAGNOSIS — G47419 Narcolepsy without cataplexy: Secondary | ICD-10-CM

## 2021-11-24 DIAGNOSIS — G43109 Migraine with aura, not intractable, without status migrainosus: Secondary | ICD-10-CM

## 2021-11-24 MED ORDER — XYREM 500 MG/ML PO SOLN: ORAL | 1 refills | Status: DC

## 2021-11-30 ENCOUNTER — Telehealth: Payer: Self-pay | Admitting: Neurology

## 2021-11-30 NOTE — Telephone Encounter (Signed)
PA completed on CMM through [place]- G47.419 ?KEY: [id number] ?Will await response ? ? ?

## 2021-11-30 NOTE — Telephone Encounter (Signed)
PA approved Effective from 11/30/2021 through 12/01/2022. ?

## 2021-12-30 NOTE — Progress Notes (Signed)
? ?PATIENT: Christopher Weber ?DOB: 1946-07-31 ? ?REASON FOR VISIT: follow up ?HISTORY FROM: patient ? ?Virtual Visit via Telephone Note ? ?I connected with Christopher Weber on 12/30/21 at  9:30 AM EDT by telephone and verified that I am speaking with the correct person using two identifiers. ?  ?I discussed the limitations, risks, security and privacy concerns of performing an evaluation and management service by telephone and the availability of in person appointments. I also discussed with the patient that there may be a patient responsible charge related to this service. The patient expressed understanding and agreed to proceed. ? ? ?History of Present Illness: ? ?12/30/21 ALL: ?ANTAVIUS SPERBECK is a 76 y.o. male here today for follow up. He is followed by Jinny Blossom and Dr Dohmeier for narcolepsy without cataplexy on Xyrem and migraines. He was last seen 06/2021 by Dr Brett Fairy and doing well. Labs have been stable. He usually goes to bed around 9 and is able to sleep 1-1.5hr. He takes Xyrem 2grams around 10:30pm and repeat dose around 2am. He is usually able to get about 7-8 hours of sleep every night. Migraines are well managed with as needed sumatriptan. May have 4-5 per month. Triggers are lack of sleep. He often takes sumatriptan with naproxen and feels this works well.  ? ? ?History (copied from Dr Dohmeier's previous note) ? 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  ?Had ankle replacement and had questiont to [place] program about opiate pain medication whle continuing XYREM, was not given advice by the pharmacist.  ?Got a severe itch from codeine and stopped,  now on xyrem again. 5 month post surgery he is still working on ROM. Numbness on the dorsum of the right foot. No need to switch to [facility], no HTN.  ?He is scheduled for Vaccine omicron today and had flu shot 14 days ago. Has a migraine 1-2 times a month, responds to triptan.  ?Atrial fib on eloquis. Echo was done in  12/21, EF 55-60%, mild mitral valve prolapse with probably mild MR.   ?  ?Coronary CTA was done in 3/22.  This was a difficult study but calcium score 0 and no evidence for significant disease.  CT neck showed asymmetric right vallecula enlargement.  He saw ENT with examination that showed no evidence for malignancy.  He had total ankle replacement in 5/22.  ?  ?He is now out of the boot and walking, continues to do PT for his ankle.  Since I last saw him, he has had 2 episodes of atrial fibrillation which only lasted about 2 hrs.  He gets symptomatic palpitations when in AF.  No chest pain or exertional dyspnea.  Overall doing well.  ?  ?ECG (personally reviewed): NSR, normal ?  ?Labs (9/21): K 4.7, creatinine 1.42, TSH normal, hgb 14.2 ?Labs (12/21): BNP 17 ?Labs (2/22): LDL 141 ?Labs (5/22): K 4.5, creatinine 1.3, hgb 11.1 ?  ?Diverticulitis in spring 2022, on a journey to [place], [name] order via telehealth cipro and flagyl.  ? ?Observations/Objective: ? ?Generalized: Well developed, in no acute distress  ?Mentation: Alert oriented to time, place, history taking. Follows all commands speech and language fluent ? ? ?Assessment and Plan: ? ?76 y.o. year old male  has a past medical history of Achilles rupture, left, Actinic keratoses, Arthritis, Back pain, CHF (congestive heart failure) (HCC), Colon polyp (2015), Controlled narcolepsy (08/29/2014), Diverticulitis, Dyspnea, Fever blister, Generalized headaches, GERD (gastroesophageal reflux disease), Hepatitis  A (1985), Hypersomnia, Knee injuries (2002), Migraine, Migraine headache with aura (08/29/2014), Narcolepsy without cataplexy (04/12/2013), Pain, Rotator cuff (capsule) sprain (08/29/2014), Seborrhea, Sinusitis, and Tendinitis. here with ? ?  ICD-10-CM   ?1. Narcolepsy cataplexy syndrome  G47.411   ?  ?2. Migraine with aura and without status migrainosus, not intractable  G43.109   ?  ? ? 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 He is aware of safety precautions with this medication. He is scheduled for CPE with [name] in 01/2022. He will have labs sent to [place] for review. He will continue sumatriptan with naproxen as needed for migraine abortion. Healthy lifestyle habits encouraged. He will follow up with Dr Brett Fairy in 6 months, sooner if needed. He verbalizes understanding and agreement with this plan.  ? ?No orders of the defined types were placed in this encounter. ? ? ?No orders of the defined types were placed in this encounter. ? ? ? ?Follow Up Instructions: ? ?I discussed the assessment and treatment plan with the patient. The patient was provided an opportunity to ask questions and all were answered. The patient agreed with the plan and demonstrated an understanding of the instructions. ?  ?The patient was advised to call back or seek an in-person evaluation if the symptoms worsen or if the condition fails to improve as anticipated. ? ?I provided 15 minutes of non-face-to-face time during this encounter. Patient located at their place of residence during [place] visit. Provider is in the office.  ? ? ?Debbora Presto, NP  ?

## 2021-12-30 NOTE — Patient Instructions (Signed)
Below is our plan: ? ?We will continue Xyrem 2grams at and sumatriptan as needed.  ? ?Please make sure you are staying well hydrated. I recommend 50-60 ounces daily. Well balanced diet and regular exercise encouraged. Consistent sleep schedule with 6-8 hours recommended.  ? ?Please continue follow up with care team as directed.  ? ?Follow up with Dr Brett Fairy in 6 months  ? ?You may receive a survey regarding today's visit. I encourage you to leave honest feed back as I do use this information to improve patient care. Thank you for seeing me today!  ? ? ?

## 2021-12-31 ENCOUNTER — Telehealth (INDEPENDENT_AMBULATORY_CARE_PROVIDER_SITE_OTHER): Payer: Medicare Other | Admitting: Family Medicine

## 2021-12-31 ENCOUNTER — Encounter: Payer: Self-pay | Admitting: Family Medicine

## 2021-12-31 DIAGNOSIS — G47411 Narcolepsy with cataplexy: Secondary | ICD-10-CM

## 2021-12-31 DIAGNOSIS — G43109 Migraine with aura, not intractable, without status migrainosus: Secondary | ICD-10-CM

## 2021-12-31 DIAGNOSIS — G47419 Narcolepsy without cataplexy: Secondary | ICD-10-CM | POA: Diagnosis not present

## 2022-01-02 ENCOUNTER — Other Ambulatory Visit: Payer: Self-pay | Admitting: Neurology

## 2022-01-07 ENCOUNTER — Telehealth: Payer: Self-pay | Admitting: Neurology

## 2022-01-07 NOTE — Telephone Encounter (Signed)
PA completed for the patient for sumatriptan on CMM/[employer] ?[id number] ?Will await determination ?

## 2022-01-08 DIAGNOSIS — Z23 Encounter for immunization: Secondary | ICD-10-CM | POA: Diagnosis not present

## 2022-01-11 NOTE — Telephone Encounter (Signed)
PA was cancelled stating that the medication ordered is already on formulary and allows 18 tablet per 30 days. Pt should be able to get this med filled with no problems.  ?

## 2022-01-19 DIAGNOSIS — M19171 Post-traumatic osteoarthritis, right ankle and foot: Secondary | ICD-10-CM | POA: Diagnosis not present

## 2022-01-20 DIAGNOSIS — B009 Herpesviral infection, unspecified: Secondary | ICD-10-CM | POA: Diagnosis not present

## 2022-01-20 DIAGNOSIS — Z0001 Encounter for general adult medical examination with abnormal findings: Secondary | ICD-10-CM | POA: Diagnosis not present

## 2022-01-20 DIAGNOSIS — D6869 Other thrombophilia: Secondary | ICD-10-CM | POA: Diagnosis not present

## 2022-01-20 DIAGNOSIS — G43109 Migraine with aura, not intractable, without status migrainosus: Secondary | ICD-10-CM | POA: Diagnosis not present

## 2022-01-20 DIAGNOSIS — K219 Gastro-esophageal reflux disease without esophagitis: Secondary | ICD-10-CM | POA: Diagnosis not present

## 2022-01-20 DIAGNOSIS — N529 Male erectile dysfunction, unspecified: Secondary | ICD-10-CM | POA: Diagnosis not present

## 2022-01-20 DIAGNOSIS — Z125 Encounter for screening for malignant neoplasm of prostate: Secondary | ICD-10-CM | POA: Diagnosis not present

## 2022-01-20 DIAGNOSIS — Z8042 Family history of malignant neoplasm of prostate: Secondary | ICD-10-CM | POA: Diagnosis not present

## 2022-01-20 DIAGNOSIS — Z79899 Other long term (current) drug therapy: Secondary | ICD-10-CM | POA: Diagnosis not present

## 2022-01-20 DIAGNOSIS — M19071 Primary osteoarthritis, right ankle and foot: Secondary | ICD-10-CM | POA: Diagnosis not present

## 2022-01-20 DIAGNOSIS — I4891 Unspecified atrial fibrillation: Secondary | ICD-10-CM | POA: Diagnosis not present

## 2022-01-20 DIAGNOSIS — Z23 Encounter for immunization: Secondary | ICD-10-CM | POA: Diagnosis not present

## 2022-01-20 DIAGNOSIS — G47419 Narcolepsy without cataplexy: Secondary | ICD-10-CM | POA: Diagnosis not present

## 2022-01-20 DIAGNOSIS — Z0000 Encounter for general adult medical examination without abnormal findings: Secondary | ICD-10-CM | POA: Diagnosis not present

## 2022-01-20 DIAGNOSIS — R0982 Postnasal drip: Secondary | ICD-10-CM | POA: Diagnosis not present

## 2022-01-20 DIAGNOSIS — Z1389 Encounter for screening for other disorder: Secondary | ICD-10-CM | POA: Diagnosis not present

## 2022-01-29 DIAGNOSIS — R319 Hematuria, unspecified: Secondary | ICD-10-CM | POA: Diagnosis not present

## 2022-01-29 DIAGNOSIS — Z0000 Encounter for general adult medical examination without abnormal findings: Secondary | ICD-10-CM | POA: Diagnosis not present

## 2022-02-03 DIAGNOSIS — R3129 Other microscopic hematuria: Secondary | ICD-10-CM | POA: Diagnosis not present

## 2022-02-03 DIAGNOSIS — N281 Cyst of kidney, acquired: Secondary | ICD-10-CM | POA: Diagnosis not present

## 2022-02-04 DIAGNOSIS — L905 Scar conditions and fibrosis of skin: Secondary | ICD-10-CM | POA: Diagnosis not present

## 2022-02-04 DIAGNOSIS — D225 Melanocytic nevi of trunk: Secondary | ICD-10-CM | POA: Diagnosis not present

## 2022-02-04 DIAGNOSIS — L820 Inflamed seborrheic keratosis: Secondary | ICD-10-CM | POA: Diagnosis not present

## 2022-02-04 DIAGNOSIS — D3615 Benign neoplasm of peripheral nerves and autonomic nervous system of abdomen: Secondary | ICD-10-CM | POA: Diagnosis not present

## 2022-02-04 DIAGNOSIS — L218 Other seborrheic dermatitis: Secondary | ICD-10-CM | POA: Diagnosis not present

## 2022-02-04 DIAGNOSIS — L570 Actinic keratosis: Secondary | ICD-10-CM | POA: Diagnosis not present

## 2022-02-04 DIAGNOSIS — L821 Other seborrheic keratosis: Secondary | ICD-10-CM | POA: Diagnosis not present

## 2022-03-16 ENCOUNTER — Other Ambulatory Visit (HOSPITAL_COMMUNITY): Payer: Self-pay | Admitting: Cardiology

## 2022-04-13 HISTORY — PX: QUADRICEPS TENDON REPAIR: SHX756

## 2022-04-16 DIAGNOSIS — M25561 Pain in right knee: Secondary | ICD-10-CM | POA: Diagnosis not present

## 2022-04-19 DIAGNOSIS — M25561 Pain in right knee: Secondary | ICD-10-CM | POA: Diagnosis not present

## 2022-04-21 ENCOUNTER — Encounter: Payer: Self-pay | Admitting: Nurse Practitioner

## 2022-04-21 ENCOUNTER — Ambulatory Visit (INDEPENDENT_AMBULATORY_CARE_PROVIDER_SITE_OTHER): Payer: Medicare Other | Admitting: Nurse Practitioner

## 2022-04-21 ENCOUNTER — Telehealth: Payer: Self-pay | Admitting: *Deleted

## 2022-04-21 DIAGNOSIS — Z01810 Encounter for preprocedural cardiovascular examination: Secondary | ICD-10-CM | POA: Diagnosis not present

## 2022-04-21 NOTE — Telephone Encounter (Signed)
   Pre-operative Risk Assessment    Patient Name: Christopher Weber  DOB: [birth year] MRN: [id number]      Request for Surgical Clearance    Procedure:  RIGHT QUAD TENDON REPAIR  Date of Surgery:  Clearance 04/22/22 URGENT SURGERY                                Surgeon:  DR. Edmonia Lynch  Surgeon's Group or Practice Name:  [facility] Phone number:  [phone]  Fax number:  [phone] ATTN: KELLY HIGH   Type of Clearance Requested:   - Medical  - Pharmacy:  Hold Apixaban (Eliquis)     Type of Anesthesia:   CHOICE   Additional requests/questions:    [name]   04/21/2022, 2:11 PM

## 2022-04-21 NOTE — Telephone Encounter (Signed)
Patient states he is returning a call from [place] regarding clearance, CMA unavailable for transfer. Please return call when able.

## 2022-04-21 NOTE — Telephone Encounter (Signed)
Pt agreeable to URGENT ADD ON for pre op tele appt .   Per pre op provider to add pt on today.

## 2022-04-21 NOTE — Progress Notes (Signed)
Virtual Visit via Telephone Note   Because of Carter Kassel Lobb's co-morbid illnesses, he is at least at moderate risk for complications without adequate follow up.  This format is felt to be most appropriate for this patient at this time.  The patient did not have access to video technology/had technical difficulties with video requiring transitioning to audio format only (telephone).  All issues noted in this document were discussed and addressed.  No physical exam could be performed with this format.  Please refer to the patient's chart for his consent to telehealth for [facility].  Evaluation Performed:  Preoperative cardiovascular risk assessment _____________   Date:  04/21/2022   Patient ID:  Christopher Weber, DOB 11/05/1945, MRN [id number] Patient Location:  Home Provider location:   Office  Primary Care Provider:  [name], MD Primary Cardiologist:  None  Chief Complaint / Patient Profile   76 y.o. y/o male with a h/o PAF on chronic anticoagulation, calcium score of zero, mild mitral regurgitation who is pending right quad tendon repair and presents today for telephonic preoperative cardiovascular risk assessment.  Past Medical History    Past Medical History:  Diagnosis Date   Achilles rupture, left    Actinic keratoses    Arthritis    Back pain    low   CHF (congestive heart failure) (HCC)    Colon polyp 2015   Controlled narcolepsy 08/29/2014   Diverticulitis    Dyspnea    on exertion   Fever blister    episodically   Generalized headaches    GERD (gastroesophageal reflux disease)    Hepatitis A 1985   Hypersomnia    Knee injuries 2002   left   Migraine    Migraine headache with aura 08/29/2014   Narcolepsy without cataplexy 04/12/2013    MSLT with  MSL 4.4 minutes.    Pain    facial trigemonal   Rotator cuff (capsule) sprain 08/29/2014   Seborrhea    chronic   Sinusitis    Tendinitis    left middle finger   Past Surgical History:   Procedure Laterality Date   COLONOSCOPY  2011-last   every 5 years   FINGER SURGERY  2009/2010   trigger finger of left hand     [place]   right inguinal    LASIK  1998 or [place] RELEASE  08/31/2012   Procedure: MINOR RELEASE TRIGGER FINGER/A-1 PULLEY;  Surgeon: Cammie Sickle., MD;  Location: [facility];  Service: Orthopedics;  Laterality: Right;  right long finger   VASECTOMY      Allergies  Allergies  Allergen Reactions   Azithromycin Photosensitivity   Tetracyclines & Related     photosensitivity - can take medication but just must avoid being out in the sun    Doxycycline Photosensitivity   Dulcolax Stool Softener [Dss] Nausea And Vomiting   Oxycodone Rash    History of Present Illness    Christopher Weber is a 76 y.o. male who presents via audio/video conferencing for a telehealth visit today.  Pt was last seen in cardiology clinic on 11/06/21 by Dr. Aundra Dubin.  At that time FINNBAR CEDILLOS was doing well.  The patient is now pending procedure as outlined above. Since his last visit, he denies chest pain, shortness of breath, lower extremity edema, fatigue, palpitations, melena, hematuria, hemoptysis, diaphoresis, weakness, presyncope, syncope, orthopnea, and PND.  Home Medications    Prior to Admission medications   Medication Sig Start Date End Date Taking? Authorizing Provider  CALCIUM-MAGNESIUM-ZINC PO Take by mouth.    [provider]  ELIQUIS 5 MG TABS tablet TAKE 1 TABLET(5 MG) BY MOUTH TWICE DAILY 03/18/22   Bensimhon, Shaune Pascal, MD  metoprolol tartrate (LOPRESSOR) 25 MG tablet Take 1 tablet (25 mg total) by mouth every 8 (eight) hours as needed. For a fib 05/07/21 05/07/22  Larey Dresser, MD  mometasone (NASONEX) 50 MCG/ACT nasal spray Place 2 sprays into the nose as needed.    [provider]  Multiple Vitamin (MULTIVITAMIN) capsule Take 1 capsule by mouth daily.     [provider]  multivitamin-lutein (OCUVITE-LUTEIN) CAPS capsule Take 1 capsule by mouth daily.    [provider]  Omeprazole-Sodium Bicarbonate (ZEGERID OTC PO) 2 tablets at bedtime. 06/29/19   [provider]  pantoprazole (PROTONIX) 20 MG tablet Take 20 mg by mouth daily.  06/29/19   [provider]  SUMAtriptan (IMITREX) 100 MG tablet TAKE 1 TABLET BY MOUTH EVERY 2 HOURS AS NEEDED FOR MIGRAINE. MAY REPEAT IN 2 HOURS IF HEADACHE PERSISTS OR RECURS 01/04/22   Lomax, Amy, NP  valACYclovir (VALTREX) 500 MG tablet Take 500 mg by mouth 3 (three) times a week. Mon, [place] Fri    [provider]  VITAMIN D PO Take 1 tablet by mouth daily in the afternoon.    [provider]  XYREM 500 MG/ML SOLN Take 2 grams of xyrem by mouth twice nightly. 11/24/21   Dohmeier, Asencion Partridge, MD    Physical Exam    Vital Signs:  HUTSON LUFT does not have vital signs available for review today.  Given telephonic nature of communication, physical exam is limited. AAOx3. NAD. Normal affect.  Speech and respirations are unlabored.  Accessory Clinical Findings    None  Assessment & Plan    1.  Preoperative Cardiovascular Risk Assessment: The patient is doing well from a cardiac perspective. Therefore, based on ACC/AHA guidelines, the patient would be at acceptable risk for the planned procedure without further cardiovascular testing. The patient was advised that if he develops new symptoms prior to surgery to contact our office to arrange for a follow-up visit, and he verbalized understanding. According to the Revised Cardiac Risk Index (RCRI), his Perioperative Risk of Major Cardiac Event is (%): 0.4. His Functional Capacity in METs is: 7.59 according to the [employer] Activity Status Index (DASI).  Took last dose of Eliquis 04/19/22 under the advisement of Dr. Percell Miller. Advised him to resume it as soon as deemed hemodynamically stable post-operatively.   A copy of this note  will be routed to requesting surgeon.  Time:   Today, I have spent 6 minutes with the patient with telehealth technology discussing medical history, symptoms, and management plan.     Emmaline Life, NP-C    04/21/2022, 3:51 PM [place] [address] Office [phone] Fax [phone]

## 2022-04-21 NOTE — Telephone Encounter (Signed)
Patient with diagnosis of afib on Eliquis for anticoagulation.    Procedure: right quad tendon repair Date of procedure: 04/22/22  CHA2DS2-VASc Score = 1  This indicates a 0.6% annual risk of stroke. The patient's score is based upon: CHF History: 0 HTN History: 0 Diabetes History: 0 Stroke History: 0 Vascular Disease History: 0 Age Score: 1 Gender Score: 0     CrCl 79m/min Platelet count 228K  Procedure is tomorrow. [place] to hold Eliquis for 1 day prior to procedure.  **This guidance is not considered finalized until pre-operative APP has relayed final recommendations.**

## 2022-04-21 NOTE — Telephone Encounter (Signed)
Primary Cardiologist:None   Preoperative team, please contact this patient and set up a phone call appointment for further preoperative risk assessment. Please obtain consent and complete medication review. Thank you for your help.   I confirm that guidance regarding antiplatelet and oral anticoagulation therapy has been completed and, if necessary, noted below.   Emmaline Life, NP-C    04/21/2022, 2:30 PM [place] [address], [address] Office [phone] Fax [phone]

## 2022-04-21 NOTE — Telephone Encounter (Signed)
Pt agreeable to URGENT ADD ON for pre op tele appt .Marland Kitchen    Patient Consent for Virtual Visit        Christopher Weber has provided verbal consent on 04/21/2022 for a virtual visit (video or telephone).   CONSENT FOR VIRTUAL VISIT FOR:  Christopher Weber  By participating in this virtual visit I agree to the following:  I hereby voluntarily request, consent and authorize [facility] and its employed or contracted physicians, physician assistants, nurse practitioners or other licensed health care professionals (the Practitioner), to provide me with telemedicine health care services (the "Services") as deemed necessary by the treating Practitioner. I acknowledge and consent to receive the Services by the Practitioner via telemedicine. I understand that the telemedicine visit will involve communicating with the Practitioner through live audiovisual communication technology and the disclosure of certain medical information by electronic transmission. I acknowledge that I have been given the opportunity to request an in-person assessment or other available alternative prior to the telemedicine visit and am voluntarily participating in the telemedicine visit.  I understand that I have the right to withhold or withdraw my consent to the use of telemedicine in the course of my care at any time, without affecting my right to future care or treatment, and that the Practitioner or I may terminate the telemedicine visit at any time. I understand that I have the right to inspect all information obtained and/or recorded in the course of the telemedicine visit and may receive copies of available information for a reasonable fee.  I understand that some of the potential risks of receiving the Services via telemedicine include:  Delay or interruption in medical evaluation due to technological equipment failure or disruption; Information transmitted may not be sufficient (e.g. poor resolution of images) to allow for  appropriate medical decision making by the Practitioner; and/or  In rare instances, security protocols could fail, causing a breach of personal health information.  Furthermore, I acknowledge that it is my responsibility to provide information about my medical history, conditions and care that is complete and accurate to the best of my ability. I acknowledge that Practitioner's advice, recommendations, and/or decision may be based on factors not within their control, such as incomplete or inaccurate data provided by me or distortions of diagnostic images or specimens that may result from electronic transmissions. I understand that the practice of medicine is not an exact science and that Practitioner makes no warranties or guarantees regarding treatment outcomes. I acknowledge that a copy of this consent can be made available to me via my patient portal ([facility]), or I can request a printed copy by calling the office of [facility].    I understand that my insurance will be billed for this visit.   I have read or had this consent read to me. I understand the contents of this consent, which adequately explains the benefits and risks of the Services being provided via telemedicine.  I have been provided ample opportunity to ask questions regarding this consent and the Services and have had my questions answered to my satisfaction. I give my informed consent for the services to be provided through the use of telemedicine in my medical care

## 2022-04-22 DIAGNOSIS — Y999 Unspecified external cause status: Secondary | ICD-10-CM | POA: Diagnosis not present

## 2022-04-22 DIAGNOSIS — S83421A Sprain of lateral collateral ligament of right knee, initial encounter: Secondary | ICD-10-CM | POA: Diagnosis not present

## 2022-04-22 DIAGNOSIS — S76111A Strain of right quadriceps muscle, fascia and tendon, initial encounter: Secondary | ICD-10-CM | POA: Diagnosis not present

## 2022-04-22 DIAGNOSIS — M23631 Other spontaneous disruption of medial collateral ligament of right knee: Secondary | ICD-10-CM | POA: Diagnosis not present

## 2022-04-22 DIAGNOSIS — S76121A Laceration of right quadriceps muscle, fascia and tendon, initial encounter: Secondary | ICD-10-CM | POA: Diagnosis not present

## 2022-04-22 DIAGNOSIS — S838X1A Sprain of other specified parts of right knee, initial encounter: Secondary | ICD-10-CM | POA: Diagnosis not present

## 2022-04-22 DIAGNOSIS — G8918 Other acute postprocedural pain: Secondary | ICD-10-CM | POA: Diagnosis not present

## 2022-04-22 DIAGNOSIS — X58XXXA Exposure to other specified factors, initial encounter: Secondary | ICD-10-CM | POA: Diagnosis not present

## 2022-04-26 ENCOUNTER — Telehealth (HOSPITAL_COMMUNITY): Payer: Self-pay | Admitting: *Deleted

## 2022-04-26 NOTE — Telephone Encounter (Signed)
Pt left vm stating he had surgery recently and It is hard to get around because his leg is in a straight leg brace. Pt asked if he really needed to keep upcoming appt or could he push it out until he feels better.

## 2022-04-28 ENCOUNTER — Telehealth (HOSPITAL_COMMUNITY): Payer: Self-pay

## 2022-04-28 NOTE — Telephone Encounter (Signed)
Left vm for pt to return call  

## 2022-04-28 NOTE — Telephone Encounter (Signed)
Surgical clearnace form faxed to [facility] on 04/28/2022 @ 11.00am. Per Dr. Aundra Dubin "ok to hold eliquis 2 days prior and day of procedure"

## 2022-04-29 ENCOUNTER — Ambulatory Visit (HOSPITAL_BASED_OUTPATIENT_CLINIC_OR_DEPARTMENT_OTHER)
Admission: RE | Admit: 2022-04-29 | Discharge: 2022-04-29 | Disposition: A | Discharge status: Home / Self Care | Payer: Medicare Other | Source: Ambulatory Visit | Attending: Internal Medicine | Admitting: Internal Medicine

## 2022-04-29 ENCOUNTER — Ambulatory Visit (HOSPITAL_COMMUNITY)
Admission: RE | Admit: 2022-04-29 | Discharge: 2022-04-29 | Disposition: A | Discharge status: Home / Self Care | Payer: Medicare Other | Source: Ambulatory Visit | Attending: Internal Medicine | Admitting: Internal Medicine

## 2022-04-29 ENCOUNTER — Other Ambulatory Visit: Payer: Self-pay

## 2022-04-29 VITALS — BP 112/76 | HR 60 | Wt 162.0 lb

## 2022-04-29 DIAGNOSIS — Z7901 Long term (current) use of anticoagulants: Secondary | ICD-10-CM | POA: Insufficient documentation

## 2022-04-29 DIAGNOSIS — I340 Nonrheumatic mitral (valve) insufficiency: Secondary | ICD-10-CM

## 2022-04-29 DIAGNOSIS — G47419 Narcolepsy without cataplexy: Secondary | ICD-10-CM | POA: Diagnosis not present

## 2022-04-29 DIAGNOSIS — R079 Chest pain, unspecified: Secondary | ICD-10-CM | POA: Diagnosis not present

## 2022-04-29 DIAGNOSIS — R0600 Dyspnea, unspecified: Secondary | ICD-10-CM | POA: Insufficient documentation

## 2022-04-29 DIAGNOSIS — Z79899 Other long term (current) drug therapy: Secondary | ICD-10-CM

## 2022-04-29 DIAGNOSIS — I480 Paroxysmal atrial fibrillation: Secondary | ICD-10-CM

## 2022-04-29 DIAGNOSIS — I341 Nonrheumatic mitral (valve) prolapse: Secondary | ICD-10-CM | POA: Diagnosis not present

## 2022-04-29 LAB — BASIC METABOLIC PANEL
Anion gap: 8 (ref 5–15)
BUN: 20 mg/dL (ref 8–23)
CO2: 24 mmol/L (ref 22–32)
Calcium: 9.2 mg/dL (ref 8.9–10.3)
Chloride: 104 mmol/L (ref 98–111)
Creatinine, Ser: 1.13 mg/dL (ref 0.61–1.24)
GFR, Estimated: >60 mL/min (ref >60)
Glucose, Bld: 100 mg/dL — ABNORMAL HIGH (ref 70–99)
Potassium: 3.8 mmol/L (ref 3.5–5.1)
Sodium: 136 mmol/L (ref 135–145)

## 2022-04-29 LAB — CBC
HCT: 38.9 % — ABNORMAL LOW (ref 39.0–52.0)
Hemoglobin: 13.5 g/dL (ref 13.0–17.0)
MCH: 31.3 pg (ref 26.0–34.0)
MCHC: 34.7 g/dL (ref 30.0–36.0)
MCV: 90.3 fL (ref 80.0–100.0)
Platelets: 311 10*3/uL (ref 150–400)
RBC: 4.31 MIL/uL (ref 4.22–5.81)
RDW: 12.6 % (ref 11.5–15.5)
WBC: 6.5 10*3/uL (ref 4.0–10.5)
nRBC: 0 % (ref 0.0–0.2)

## 2022-04-29 LAB — ECHOCARDIOGRAM COMPLETE
Area-P 1/2: 4.63 cm2
Calc EF: 51.9 %
S' Lateral: 2.9 cm
Single Plane A2C EF: 47 %
Single Plane A4C EF: 56.5 %

## 2022-04-29 NOTE — Progress Notes (Signed)
PCP: [name], MD Cardiology: Dr. Aundra Dubin  76 y.o. with history of narcolepsy and laryngopharyngeal reflux presents for followup of atrial fibrillation.  In early 2020, he developed dyspnea, a sensation that he was "breathing through a straw." He had an extensive workup in 2020 with a normal ETT, normal PFTs, and normal high resolution CT chest.  Ultimately, he was diagnosed with laryngopharyngeal reflux. Symptoms were improved considerably with Protonix + Zegerid.    With symptoms of LPR controlled, he did not have any exertional dyspnea or chest pain. In 11/21, however, he cut back on Protonix and Zegerid, and dyspnea returned.  In addition, on 11/31/21, he developed tachypalpitations.  His Apple Watch showed atrial fibrillation. He brought the tracings to show me, they were definitely atrial fibrillation. This lasted for a few hours when resolved.  In hindsight, in 4/20, he had a very similar episode of tachypalpitations that was likely atrial fibrillation as well.  He has increased his Protonix and Zegerid again and dyspnea has resolved.  HE had atrial fibrillation again on 08/30/20 documented by Apple Watch.  He does not have OSA symptoms and is followed by Dr. Brett Fairy for narcolepsy, which is controlled.    Echo was done in 12/21, EF 55-60%, mild mitral valve prolapse with probably mild MR.    Coronary CTA was done in 3/22.  This was a difficult study but calcium score 0 and no evidence for significant disease.  CT neck showed asymmetric right vallecula enlargement.  He saw ENT with examination that showed no evidence for malignancy.  He had total ankle replacement in 5/22.   Patient had diverticulitis in 9/22, treated as outpatient.   Patient tore his quadriceps tendon in 8/23 and had recent surgery to repair it.  He had a run of atrial fibrillation lasting 3-4 hours about 3 days after surgery.    Echo was done today and reviewed, EF 55%, normal RV, moderate MR with posterior leaflet  prolapse.    Patient returns for followup of atrial fibrillation and mitral regurgitation.  No further AF since episode post-op (checks on his Apple Watch).  He is partial weight-bearing on his right leg now post-quadriceps surgery. No significant exertional dyspnea and no chest pain.  Weight down 5 lbs.    ECG (personally reviewed): NSR normal  Labs (9/21): K 4.7, creatinine 1.42, TSH normal, hgb 14.2 Labs (12/21): BNP 17 Labs (2/22): LDL 141 Labs (5/22): K 4.5, creatinine 1.3, hgb 11.1 Labs (8/22): LDL 108 Labs (10/22): creatinine 1.29, hgb 14.5  PMH: 1. HAV in 1985 2. Narcolepsy 3. Laryngopharyngeal reflux 4. Atrial fibrillation: Paroxysmal.  5. ETT (10/20): 11.6 METS, no ECG changes => normal study.  6. PFTs (10/20): Normal.  7. High resolution CT chest: Interstitial lung disease.  8. Mitral regurgitation: Echo (12/21) with EF 55-60%, mild mitral valve prolapse with probable mild MR - Echo (8/23): EF 55%, normal RV, moderate MR with posterior leaflet prolapse.  9. Coronary CTA (3/22): Calcium score 0, no evidence for significant CAD.  10. S/p total ankle replacement in 5/22.  11. 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   FH: No atrial fibrillation.  Father with "bad heart valve."   ROS: All systems reviewed and negative except as per HPI.   Current Outpatient Medications  Medication Sig Dispense Refill   CALCIUM-MAGNESIUM-ZINC PO Take by mouth.     ELIQUIS 5 MG TABS tablet TAKE 1 TABLET(5 MG) BY MOUTH TWICE DAILY 180 tablet  3   metoprolol tartrate (LOPRESSOR) 25 MG tablet Take 1 tablet (25 mg total) by mouth every 8 (eight) hours as needed. For a fib 30 tablet 5   mometasone (NASONEX) 50 MCG/ACT nasal spray Place 2 sprays into the nose as needed.     Multiple Vitamin (MULTIVITAMIN) capsule Take 1 capsule by mouth daily.     multivitamin-lutein (OCUVITE-LUTEIN) CAPS capsule Take 1 capsule by mouth daily.      Omeprazole-Sodium Bicarbonate (ZEGERID OTC PO) 2 tablets at bedtime.     pantoprazole (PROTONIX) 20 MG tablet Take 20 mg by mouth daily.      SUMAtriptan (IMITREX) 100 MG tablet TAKE 1 TABLET BY MOUTH EVERY 2 HOURS AS NEEDED FOR MIGRAINE. MAY REPEAT IN 2 HOURS IF HEADACHE PERSISTS OR RECURS 9 tablet 4   valACYclovir (VALTREX) 500 MG tablet Take 500 mg by mouth 3 (three) times a week. Mon, Wed Fri     VITAMIN D PO Take 1 tablet by mouth daily in the afternoon.     XYREM 500 MG/ML SOLN Take 2 grams of xyrem by mouth twice nightly. 270 mL 1   No current facility-administered medications for this encounter.   BP 112/76   Pulse 60   Wt 73.5 kg (162 lb)   SpO2 100%   BMI 24.63 kg/m  General: NAD Neck: No JVD, no thyromegaly or thyroid nodule.  Lungs: Clear to auscultation bilaterally with normal respiratory effort. CV: Nondisplaced PMI.  Heart regular S1/S2, no S3/S4, 1/6 HSM apex.  No peripheral edema.  No carotid bruit.  Normal pedal pulses.  Abdomen: Soft, nontender, no hepatosplenomegaly, no distention.  Skin: Intact without lesions or rashes.  Neurologic: Alert and oriented x 3.  Psych: Normal affect. Extremities: No clubbing or cyanosis.  HEENT: Normal.   Assessment/plan: 1. Atrial fibrillation: Paroxysmal.  CHADSVASC 1 (age).  No OSA symptoms (followed by Dr. Brett Fairy already for narcolepsy).  Not a heavy drinker.  Weight is ideal.  Currently in NSR. TSH normal.  Echo showed mitral valve prolapse with mild MR, normal EF 55-60% in 12/21.  He has had 6 episodes of atrial fibrillation, lasting for a few hours each, over the last 2 years.  - With CHADSVASC 1, anticoagulation not strictly indicated but can be considered if bleeding risk is low.  I think that his bleeding risk is minimal.  He is now on Eliquis 5 mg bid. CBC and BMET today.  - We disussed options for atrial fibrillation => currently rare and minimally symptomatic, he does not want further treatment at this time.  He would be a  candidate for pill-in-the-pocket flecainide or atrial fibrillation ablation if episodes become more frequent or symptomatic. - He has metoprolol tartrate to use 25 mg prn recurrent atrial fibrillation.  2. Chest pain: Negative coronary CTA in 3/22, no further chest pain.  3. Mitral valve prolapse/MR: Moderate on echo today.   - Will need followup over time.    Followup 6 months.   Loralie Champagne 04/29/2022

## 2022-04-29 NOTE — Patient Instructions (Signed)
No change in medications. Return to [facility] in 6 months. Please call our office in December to schedule your appointment.

## 2022-04-29 NOTE — Progress Notes (Signed)
  Echocardiogram 2D Echocardiogram has been performed.  Christopher Weber 04/29/2022, 11:08 AM

## 2022-05-03 DIAGNOSIS — M23631 Other spontaneous disruption of medial collateral ligament of right knee: Secondary | ICD-10-CM | POA: Diagnosis not present

## 2022-05-18 ENCOUNTER — Telehealth: Payer: Self-pay

## 2022-05-18 NOTE — Telephone Encounter (Signed)
Spoke with [facility] at the patients request, wanted to switch his Xyrem to generic. Per discussion with Marin Olp, verbal order given to switch to generic

## 2022-05-19 ENCOUNTER — Ambulatory Visit
Admission: RE | Admit: 2022-05-19 | Discharge: 2022-05-19 | Disposition: A | Discharge status: Home / Self Care | Payer: Medicare Other | Source: Ambulatory Visit | Attending: Internal Medicine | Admitting: Internal Medicine

## 2022-05-19 ENCOUNTER — Other Ambulatory Visit: Payer: Self-pay | Admitting: Internal Medicine

## 2022-05-19 DIAGNOSIS — K5732 Diverticulitis of large intestine without perforation or abscess without bleeding: Secondary | ICD-10-CM | POA: Diagnosis not present

## 2022-05-19 DIAGNOSIS — R109 Unspecified abdominal pain: Secondary | ICD-10-CM

## 2022-05-19 DIAGNOSIS — K529 Noninfective gastroenteritis and colitis, unspecified: Secondary | ICD-10-CM | POA: Diagnosis not present

## 2022-05-19 DIAGNOSIS — N281 Cyst of kidney, acquired: Secondary | ICD-10-CM | POA: Diagnosis not present

## 2022-05-19 DIAGNOSIS — K5790 Diverticulosis of intestine, part unspecified, without perforation or abscess without bleeding: Secondary | ICD-10-CM | POA: Diagnosis not present

## 2022-05-19 MED ORDER — IOPAMIDOL (ISOVUE-300) INJECTION 61%
100.0000 mL | Freq: Once | INTRAVENOUS | Status: AC | PRN
  Administered 2022-05-19: 100 mL via INTRAVENOUS

## 2022-06-02 DIAGNOSIS — M23631 Other spontaneous disruption of medial collateral ligament of right knee: Secondary | ICD-10-CM | POA: Diagnosis not present

## 2022-06-10 DIAGNOSIS — Z23 Encounter for immunization: Secondary | ICD-10-CM | POA: Diagnosis not present

## 2022-06-11 DIAGNOSIS — M6281 Muscle weakness (generalized): Secondary | ICD-10-CM | POA: Diagnosis not present

## 2022-06-11 DIAGNOSIS — M25661 Stiffness of right knee, not elsewhere classified: Secondary | ICD-10-CM | POA: Diagnosis not present

## 2022-06-11 DIAGNOSIS — S76121D Laceration of right quadriceps muscle, fascia and tendon, subsequent encounter: Secondary | ICD-10-CM | POA: Diagnosis not present

## 2022-06-11 DIAGNOSIS — R262 Difficulty in walking, not elsewhere classified: Secondary | ICD-10-CM | POA: Diagnosis not present

## 2022-06-17 DIAGNOSIS — Z23 Encounter for immunization: Secondary | ICD-10-CM | POA: Diagnosis not present

## 2022-06-18 DIAGNOSIS — S76121D Laceration of right quadriceps muscle, fascia and tendon, subsequent encounter: Secondary | ICD-10-CM | POA: Diagnosis not present

## 2022-06-18 DIAGNOSIS — M25661 Stiffness of right knee, not elsewhere classified: Secondary | ICD-10-CM | POA: Diagnosis not present

## 2022-06-18 DIAGNOSIS — M6281 Muscle weakness (generalized): Secondary | ICD-10-CM | POA: Diagnosis not present

## 2022-06-18 DIAGNOSIS — R262 Difficulty in walking, not elsewhere classified: Secondary | ICD-10-CM | POA: Diagnosis not present

## 2022-06-23 DIAGNOSIS — M6281 Muscle weakness (generalized): Secondary | ICD-10-CM | POA: Diagnosis not present

## 2022-06-23 DIAGNOSIS — M25661 Stiffness of right knee, not elsewhere classified: Secondary | ICD-10-CM | POA: Diagnosis not present

## 2022-06-23 DIAGNOSIS — R262 Difficulty in walking, not elsewhere classified: Secondary | ICD-10-CM | POA: Diagnosis not present

## 2022-06-23 DIAGNOSIS — S76121D Laceration of right quadriceps muscle, fascia and tendon, subsequent encounter: Secondary | ICD-10-CM | POA: Diagnosis not present

## 2022-06-25 DIAGNOSIS — M6281 Muscle weakness (generalized): Secondary | ICD-10-CM | POA: Diagnosis not present

## 2022-06-25 DIAGNOSIS — R262 Difficulty in walking, not elsewhere classified: Secondary | ICD-10-CM | POA: Diagnosis not present

## 2022-06-25 DIAGNOSIS — S76121D Laceration of right quadriceps muscle, fascia and tendon, subsequent encounter: Secondary | ICD-10-CM | POA: Diagnosis not present

## 2022-06-25 DIAGNOSIS — M25661 Stiffness of right knee, not elsewhere classified: Secondary | ICD-10-CM | POA: Diagnosis not present

## 2022-06-30 DIAGNOSIS — S76121D Laceration of right quadriceps muscle, fascia and tendon, subsequent encounter: Secondary | ICD-10-CM | POA: Diagnosis not present

## 2022-06-30 DIAGNOSIS — M6281 Muscle weakness (generalized): Secondary | ICD-10-CM | POA: Diagnosis not present

## 2022-06-30 DIAGNOSIS — M25661 Stiffness of right knee, not elsewhere classified: Secondary | ICD-10-CM | POA: Diagnosis not present

## 2022-06-30 DIAGNOSIS — R262 Difficulty in walking, not elsewhere classified: Secondary | ICD-10-CM | POA: Diagnosis not present

## 2022-07-01 ENCOUNTER — Encounter: Payer: Self-pay | Admitting: Neurology

## 2022-07-01 ENCOUNTER — Ambulatory Visit (INDEPENDENT_AMBULATORY_CARE_PROVIDER_SITE_OTHER): Payer: Medicare Other | Admitting: Neurology

## 2022-07-01 DIAGNOSIS — G43109 Migraine with aura, not intractable, without status migrainosus: Secondary | ICD-10-CM | POA: Diagnosis not present

## 2022-07-01 DIAGNOSIS — G47419 Narcolepsy without cataplexy: Secondary | ICD-10-CM | POA: Diagnosis not present

## 2022-07-01 DIAGNOSIS — G47411 Narcolepsy with cataplexy: Secondary | ICD-10-CM | POA: Diagnosis not present

## 2022-07-01 MED ORDER — XYREM 500 MG/ML PO SOLN: ORAL | 1 refills | Status: DC

## 2022-07-01 NOTE — Progress Notes (Signed)
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   RV 07-01-2022:  Interval history ; August 10th had quadriceps tendon repair through [facility], MD and has recovered well, but post op within 48 hours had a bout of atrial fib, and 10 days post up diverticulitis. Lost 5 pounds.  Has some residual numbness on the left foot, from tibial nerve block.  Laryngopharyngeal reflux, and  mitral valve prolaps. Echo (8/23): EF 55%, normal RV, moderate MR with posterior leaflet prolapse.   Has done well with hypersomnia.  Feels disabled without XYREM- noted a big difference and is highly compliant  takes 2 doses, gets 7-8 hours of sleep. Before XYREM was initiated he was having memory loss, concentration was difficult, sleep was fragmented and he was in danger of falling asleep when driving. 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   Still has hypnagogic hallucinations. No ankle edema.   PLan :  Xyrem refill. Labs not needed. Hb A ic 5.5 / BMET was normal. 04/2022 creatinine 1.12.     Rv 06-18-2021: Christopher Weber presents for yearly RV and is treated to [facility].  Had ankle replacement and had questiont to [place] program about opiate pain medication whle continuing XYREM, was not given advice by the pharmacist.  Got a severe itch from codeine and stopped,  now on xyrem again. 5 month post surgery he is still working on ROM. Numbness on the dorsum of the right foot.  No need to switch to [facility], no HTN.  He is scheduled for Vaccine omicron today and had flu shot 14 days ago. Has a migraine 1-2 times a month, responds to triptan.  Atrial fib on eloquis. Echo was done in 12/21, EF 55-60%, mild mitral valve prolapse with probably mild MR.     Coronary CTA was done in 3/22.  This was a  difficult study but calcium score 0 and no evidence for significant disease.  CT neck showed asymmetric right vallecula enlargement.  He saw ENT with examination that showed no evidence for malignancy.  He had total ankle replacement in 5/22.    He is now out of the boot and walking, continues to do PT for his ankle.  Since I last saw him, he has had 2 episodes of atrial fibrillation which only lasted about 2 hrs.  He gets symptomatic palpitations when in AF.  No chest pain or exertional dyspnea.  Overall doing well.    ECG (personally reviewed): NSR, normal   Labs (9/21): K 4.7, creatinine 1.42, TSH normal, hgb 14.2 Labs (12/21): BNP 17 Labs (2/22): LDL 141 Labs (5/22): K 4.5, creatinine 1.3, hgb 11.1   Diverticulitis in spring 2022, on a journey to [place], [name] order via telehealth cipro and flagyl.     01-22-2021: [name], 6 months mark.   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  No physical symptoms-he reports that he has not tried to lose weight but over the last 3 years he has lost about 10 pounds slowly, progressively.  He had suffered from laryngeal pharyngeal reflux and lost most of the weight during the pandemic he had the first symptoms by March 2020- after a switch from Zantac ( recall )  to  Pepcid in January 2020. Has still some headaches , 2 a month - with visual aura.     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  He reports last year he had breathing trouble, felt " upper airway congested" no lung problem. Had PFTs after feeling always congested and short of air-, he had to stop Ranitidine, (Zantac) upon PCP's pressure and amit research suggesting cancer side effect.  He changed to Pepcid and finally Prilosec at night. Christopher Weber -MD Dr. Cristina Weber dx him finally with LPR , Laryngo- Pharyngeal Reflux , and he  breathes now very well. occasional migraine.  Strong visual distortions- central visual field loss, " the Picasso outlook"  Takes imitrex, needs 2 a month.   He also had a vitreous detachment - had one in the other eye while he was in [place]. Not a retinal detachment.  EDS- He is well controlled - on Xyrem - and that at a proud price of 8000 a month, weird dreams and some hypnagogic hallucinations have however still been present. Lots of REM pressure. He is on a very low dose, but is not willing to increase.  He still uses Sonata when travelling time zones- has not done so in 2020.    RV 05-31-2019,  Patient has returned from [place] in December, wife lost sense of smell and taste and still has not recovered. Had diarrhea, nausea and fever, myalgia, cough.  He did not get sick. Has not tested positive for Covid- no tests at the time.  Continues on his medications. Refills needed. Had recent labs with PCP.    UPDATE 11/23/2018 [name], 76 year old male returns for follow-up with history of narcolepsy no cataplexy.  He has been doing well he takes Xyrem twice nightly except maybe 1 night a week he does not get in the second dose.  ESS is 5 recent labs performed by primary care in January to include CBC CMP hemoglobin A1c and lipid panel reported to be within normal limits.  I do not have access to that information.  Patient had right vitreous detachment while traveling in [place] in November.  He returns for reevaluation.  He also has Sonata to take when he is traveling on a as needed basis.  UPDATE 9/6/2019CM [name] , 76 year old male returns for follow-up with history of narcolepsy cataplexy.  He has been doing well.  He continues to take Xyrem at night sometimes he does not get in the second dose.  ESS score 7.  He has not had recent labs.  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 I have the pleasure of seeing Christopher Weber today on 02 September 2017 ina routine revisit to refill his XYREM.  He had recent labs drawn with his primary care physician on 14 December, [name], and except for an elevated LDL and HDL-transaminases are normal, TSH was normal range, MPV is low at 6.7 ALP was low at 35, creatinine has always been elevated and remains at 1.4 with an estimated glomerular filtration rate of 50.    02/10/2017 Dr [name] is doing well, He continues to take 2 doses of Xyrem at nighttime but at a low dose. Xyrem has helped to achieve a higher quality of sleep, less fragmented sleep. But the lower dose has not  allowed him to skip a dose without feeling sleepy again. The patient also had noticed a decrease in headache frequency after he achieves better sleep. Since he has been treated with Valtrex for herpetic whitlow he noted further decrease in headache frequency and intensity and overall at the level of energy is well he feels cognitively brighter and he has not experienced vertigo in a while He  continues to use valtrex, has had a herpetic finger infection .      REVIEW OF SYSTEMS: Full 14 system review of systems performed and notable only for those listed, all others are neg:  Sleep : Narcolepsy without cataplexy. Type 2.  Occasional migraine.  Strong visual distortions- central visual field loss, " the Picasso outlook"  Takes imitrex, needs 2 a month.  Valtrex      ALLERGIES: Allergies  Allergen Reactions   Azithromycin Photosensitivity   Tetracyclines & Related     photosensitivity - can take medication but just must avoid being out in the sun    Doxycycline Photosensitivity   Dulcolax Stool Softener [Dss] Nausea And Vomiting   Oxycodone Rash    HOME MEDICATIONS: Outpatient Medications Prior to Visit  Medication Sig Dispense Refill   CALCIUM-MAGNESIUM-ZINC PO Take by mouth.     ELIQUIS 5 MG TABS tablet TAKE 1  TABLET(5 MG) BY MOUTH TWICE DAILY 180 tablet 3   mometasone (NASONEX) 50 MCG/ACT nasal spray Place 2 sprays into the nose as needed.     Multiple Vitamin (MULTIVITAMIN) capsule Take 1 capsule by mouth daily.     multivitamin-lutein (OCUVITE-LUTEIN) CAPS capsule Take 1 capsule by mouth daily.     Omeprazole-Sodium Bicarbonate (ZEGERID OTC PO) 2 tablets at bedtime.     pantoprazole (PROTONIX) 20 MG tablet Take 20 mg by mouth daily.      SUMAtriptan (IMITREX) 100 MG tablet TAKE 1 TABLET BY MOUTH EVERY 2 HOURS AS NEEDED FOR MIGRAINE. MAY REPEAT IN 2 HOURS IF HEADACHE PERSISTS OR RECURS 9 tablet 4   valACYclovir (VALTREX) 500 MG tablet Take 500 mg by mouth 3 (three) times a week. Mon, Wed Fri     VITAMIN D PO Take 1 tablet by mouth daily in the afternoon.     XYREM 500 MG/ML SOLN Take 2 grams of xyrem by mouth twice nightly. 270 mL 1   metoprolol tartrate (LOPRESSOR) 25 MG tablet Take 1 tablet (25 mg total) by mouth every 8 (eight) hours as needed. For a fib 30 tablet 5   No facility-administered medications prior to visit.    PAST MEDICAL HISTORY: Past Medical History:  Diagnosis Date   Achilles rupture, left    Actinic keratoses    Arthritis    Back pain    low   Colon polyp 2015   Controlled narcolepsy 08/29/2014   Diverticulitis    Dyspnea    on exertion   Fever blister    episodically   Generalized headaches    GERD (gastroesophageal reflux disease) Laryngo- Pharyngeal reflux-  Dr Christopher Weber, 2021.     Hepatitis A 1985   Hypersomnia    Knee injuries 2002   left   Migraine    Migraine headache with aura 08/29/2014   Narcolepsy without cataplexy 04/12/2013    MSLT with  MSL 4.4 minutes.    Pain    facial trigemonal   Rotator cuff (capsule) sprain 08/29/2014   Seborrhea    chronic   Sinusitis    Tendinitis    left middle finger  PAST SURGICAL HISTORY: Past Surgical History:  Procedure Laterality Date   COLONOSCOPY  2011-last   every 5 years   FINGER SURGERY   2009/2010   trigger finger of left hand     HERNIA REPAIR  1963   right inguinal    LASIK  1998 or [place] RELEASE  08/31/2012   Procedure: MINOR RELEASE TRIGGER FINGER/A-1 PULLEY;  Surgeon: Cammie Sickle., MD;  Location: [facility];  Service: Orthopedics;  Laterality: Right;  right long finger   VASECTOMY      FAMILY HISTORY: Family History  Problem Relation Age of Onset   Heart disease Father 46   Hypertension Father    Prostate cancer Father    Cancer Mother        breast,colon,skin   Heart disease Mother    CVA Maternal Grandmother    Microcephaly Maternal Grandmother    Microcephaly Paternal Grandfather     SOCIAL HISTORY: Social History   Socioeconomic History   Marital status: Married    Spouse name: Development worker, international aid   Number of children: 2   Years of education: College   Highest education level: Not on file  Occupational History    Employer: [employer]  Tobacco Use   Smoking status: Never   Smokeless tobacco: Never  Vaping Use   Vaping Use: Never used  Substance and Sexual Activity   Alcohol use: Yes    Alcohol/week: 7.0 standard drinks of alcohol    Types: 7 Cans of beer per week    Comment: consumes one beer daily   Drug use: No   Sexual activity: Not on file  Other Topics Concern   Not on file  Social History Narrative   Patient is married ([place]) and lives at home with his wife.   Patient has two adult children.   Patient is working full-time.   Patient has a Financial risk analyst, Production manager.   Patient is right-handed.   Patient drinks two cups of coffee in the morning.   Social Determinants of Health   Financial Resource Strain: Not on file  Food Insecurity: Not on file  Transportation Needs: Not on file  Physical Activity: Not on file  Stress: Not on file  Social Connections: Not on file  Intimate Partner Violence: Not on file     PHYSICAL EXAM  Vitals:    07/01/22 1040  BP: 128/81  Weight: 159 lb (72.1 kg)  Height: '5\' 8"'$  (1.727 m)   Body mass index is 24.18 kg/m.  Generalized: Well developed, in no acute distress  Head: normocephalic and atraumatic,. Oropharynx benign  Neck: Supple,   Musculoskeletal: No deformity  Skin no rash or edema Neurological examination   Mentation: Alert oriented to time, place, history taking. Attention span and concentration appropriate. Recent and remote memory intact.  Follows all commands speech and language fluent.   Cranial nerve : No change in taste and smell, Pupils were equal round reactive to light extraocular movements were full, visual field were full on confrontational test. Facial sensation and strength were normal. hearing was intact to finger rubbing bilaterally. Uvula tongue midline. head turning and shoulder shrug were normal and symmetric.Tongue protrusion into cheek strength was normal. Motor: normal bulk and tone, full strength in the BUE, BLE,  Sensory: normal and symmetric to light touch,  Coordination: finger-nose-finger, heel-to-shin bilaterally, no dysmetria Gait and Station: Rising up from seated position without assistance, normal stance,  moderate stride, good arm swing, smooth turning, able to perform tiptoe, and heel walking without difficulty. Tandem gait is steady  DIAGNOSTIC DATA (LABS, IMAGING, TESTING)   fully Covid vaccinated. January 10-25.  - I reviewed patient records, labs, notes, testing and imaging myself where available. How likely are you to doze in the following situations: 0 = not likely, 1 = slight chance, 2 = moderate chance, 3 = high chance  Sitting and Reading? Watching Television? Sitting inactive in a public place (theater or meeting)? Lying down in the afternoon when circumstances permit? Sitting and talking to someone? Sitting quietly after lunch without alcohol? In a car, while stopped for a few minutes in traffic? As a passenger in a car for an  hour without a break?  Total = 4/ 24     FSS 9/ 63   occasional migraine.  Strong visual distortions- central visual field loss, " the Picasso outlook"  Takes imitrex, needs 2 a month.   Postsurgical hydrocodone, robaxin and was off all meds 6 days later-      Component Value Date/Time   NA 136 04/29/2022 1142   NA 137 06/18/2021 1002   K 3.8 04/29/2022 1142   CL 104 04/29/2022 1142   CO2 24 04/29/2022 1142   GLUCOSE 100 (H) 04/29/2022 1142   BUN 20 04/29/2022 1142   BUN 17 06/18/2021 1002   CREATININE 1.13 04/29/2022 1142   CALCIUM 9.2 04/29/2022 1142   PROT 6.7 06/18/2021 1002   ALBUMIN 4.6 06/18/2021 1002   AST 23 06/18/2021 1002   ALT 20 06/18/2021 1002   ALKPHOS 64 06/18/2021 1002   BILITOT 0.4 06/18/2021 1002   GFRNONAA >60 04/29/2022 1142   GFRAA 57 (L) 06/05/2020 1511   ASSESSMENT AND PLAN  76 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  without cataplexy. Treated  on low dose Xyrem doing well.   Migraine with strong visual aura, more auras than headaches. Has visual distortions and central blind spot.  Vertigo and headaches improved.     Atrial fibrillation post opertively Augsut 12th 2023.   [employer] as needed when traveling overseas  He has been diagnosed with LP reflux- and improved on Prilosec .   Chronic GFR - reduction.    Plan:   Continue Xyrem at 2.0  gram twice nightly. Likes to switch to generic if financially advantageous.    Does not need refills, has no HTN and no sodium burden and will not change to [facility].    Labs CBC/ CMP/ hemoglobin A1c and lipids reported as normal at primary care.  Continue Sonata as needed  Follow up 6 months with me.   [name], MD  [facility] [address] [place], [place] [address] [phone]

## 2022-07-02 DIAGNOSIS — M25661 Stiffness of right knee, not elsewhere classified: Secondary | ICD-10-CM | POA: Diagnosis not present

## 2022-07-02 DIAGNOSIS — S76121D Laceration of right quadriceps muscle, fascia and tendon, subsequent encounter: Secondary | ICD-10-CM | POA: Diagnosis not present

## 2022-07-02 DIAGNOSIS — R262 Difficulty in walking, not elsewhere classified: Secondary | ICD-10-CM | POA: Diagnosis not present

## 2022-07-02 DIAGNOSIS — M6281 Muscle weakness (generalized): Secondary | ICD-10-CM | POA: Diagnosis not present

## 2022-07-08 ENCOUNTER — Ambulatory Visit: Payer: Medicare Other | Admitting: Neurology

## 2022-07-14 DIAGNOSIS — R262 Difficulty in walking, not elsewhere classified: Secondary | ICD-10-CM | POA: Diagnosis not present

## 2022-07-14 DIAGNOSIS — S76121D Laceration of right quadriceps muscle, fascia and tendon, subsequent encounter: Secondary | ICD-10-CM | POA: Diagnosis not present

## 2022-07-14 DIAGNOSIS — M6281 Muscle weakness (generalized): Secondary | ICD-10-CM | POA: Diagnosis not present

## 2022-07-14 DIAGNOSIS — M25661 Stiffness of right knee, not elsewhere classified: Secondary | ICD-10-CM | POA: Diagnosis not present

## 2022-07-15 DIAGNOSIS — K5792 Diverticulitis of intestine, part unspecified, without perforation or abscess without bleeding: Secondary | ICD-10-CM | POA: Diagnosis not present

## 2022-07-15 DIAGNOSIS — R1032 Left lower quadrant pain: Secondary | ICD-10-CM | POA: Diagnosis not present

## 2022-07-16 DIAGNOSIS — M25661 Stiffness of right knee, not elsewhere classified: Secondary | ICD-10-CM | POA: Diagnosis not present

## 2022-07-16 DIAGNOSIS — M6281 Muscle weakness (generalized): Secondary | ICD-10-CM | POA: Diagnosis not present

## 2022-07-16 DIAGNOSIS — R262 Difficulty in walking, not elsewhere classified: Secondary | ICD-10-CM | POA: Diagnosis not present

## 2022-07-16 DIAGNOSIS — S76121D Laceration of right quadriceps muscle, fascia and tendon, subsequent encounter: Secondary | ICD-10-CM | POA: Diagnosis not present

## 2022-07-22 ENCOUNTER — Ambulatory Visit: Payer: Medicare Other | Admitting: Neurology

## 2022-07-23 DIAGNOSIS — M25661 Stiffness of right knee, not elsewhere classified: Secondary | ICD-10-CM | POA: Diagnosis not present

## 2022-07-23 DIAGNOSIS — M6281 Muscle weakness (generalized): Secondary | ICD-10-CM | POA: Diagnosis not present

## 2022-07-23 DIAGNOSIS — R262 Difficulty in walking, not elsewhere classified: Secondary | ICD-10-CM | POA: Diagnosis not present

## 2022-07-23 DIAGNOSIS — S76121D Laceration of right quadriceps muscle, fascia and tendon, subsequent encounter: Secondary | ICD-10-CM | POA: Diagnosis not present

## 2022-07-30 DIAGNOSIS — S76121D Laceration of right quadriceps muscle, fascia and tendon, subsequent encounter: Secondary | ICD-10-CM | POA: Diagnosis not present

## 2022-08-13 DIAGNOSIS — Z7901 Long term (current) use of anticoagulants: Secondary | ICD-10-CM | POA: Diagnosis not present

## 2022-08-13 DIAGNOSIS — J387 Other diseases of larynx: Secondary | ICD-10-CM | POA: Diagnosis not present

## 2022-08-13 DIAGNOSIS — K5792 Diverticulitis of intestine, part unspecified, without perforation or abscess without bleeding: Secondary | ICD-10-CM | POA: Diagnosis not present

## 2022-08-13 DIAGNOSIS — Z86010 Personal history of colonic polyps: Secondary | ICD-10-CM | POA: Diagnosis not present

## 2022-08-31 ENCOUNTER — Telehealth: Payer: Self-pay | Admitting: Neurology

## 2022-08-31 MED ORDER — SUMATRIPTAN SUCCINATE 100 MG PO TABS: ORAL_TABLET | ORAL | 4 refills | Status: DC

## 2022-08-31 NOTE — Telephone Encounter (Signed)
E-scribed refill 

## 2022-08-31 NOTE — Telephone Encounter (Signed)
Pt is needing his SUMAtriptan (IMITREX) 100 MG tablet refill request sent to the [facility] on [address]

## 2022-09-02 ENCOUNTER — Telehealth: Payer: Self-pay | Admitting: *Deleted

## 2022-09-02 NOTE — Telephone Encounter (Signed)
Patient with diagnosis of afib on Eliquis for anticoagulation.    Procedure: colonoscopy Date of procedure: 10/15/22  CHA2DS2-VASc Score = 1  This indicates a 0.6% annual risk of stroke. The patient's score is based upon: CHF History: 0 HTN History: 0 Diabetes History: 0 Stroke History: 0 Vascular Disease History: 0 Age Score: 1 Gender Score: 0      CrCl 60m/min Platelet count 311K  Per office protocol, patient can hold Eliquis for 1-2 days prior to procedure.    **This guidance is not considered finalized until pre-operative APP has relayed final recommendations.**

## 2022-09-02 NOTE — Telephone Encounter (Signed)
   Pre-operative Risk Assessment    Patient Name: Christopher Weber  DOB: Jun 22, 1946 MRN: [id number]      Request for Surgical Clearance    Procedure:   COLONOSCOPY   Date of Surgery:  Clearance 10/15/22                                 Surgeon:  DR. Paulita Fujita Surgeon's Group or Practice Name:  [facility] Phone number:  [phone] Fax number:  [phone]   Type of Clearance Requested:   - Pharmacy:  Hold Apixaban (Eliquis) NOT INDICATED   Type of Anesthesia:   PROPOFOL   Additional requests/questions:   ASKING IF OK TO HAVE AT [facility]?  Signed, Jeanann Lewandowsky   09/02/2022, 7:05 AM

## 2022-09-03 ENCOUNTER — Telehealth: Payer: Self-pay | Admitting: *Deleted

## 2022-09-03 NOTE — Telephone Encounter (Signed)
Left message to call back to schedule a tele pre op appt.  

## 2022-09-03 NOTE — Telephone Encounter (Signed)
I s/w the pt and he has been scheduled for tele pre op appt 10/01/22 @ 2:40. Med rec and consent are done.     Patient Consent for Virtual Visit        Christopher Weber has provided verbal consent on 09/03/2022 for a virtual visit (video or telephone).   CONSENT FOR VIRTUAL VISIT FOR:  Christopher Weber  By participating in this virtual visit I agree to the following:  I hereby voluntarily request, consent and authorize [facility] and its employed or contracted physicians, physician assistants, nurse practitioners or other licensed health care professionals (the Practitioner), to provide me with telemedicine health care services (the "Services") as deemed necessary by the treating Practitioner. I acknowledge and consent to receive the Services by the Practitioner via telemedicine. I understand that the telemedicine visit will involve communicating with the Practitioner through live audiovisual communication technology and the disclosure of certain medical information by electronic transmission. I acknowledge that I have been given the opportunity to request an in-person assessment or other available alternative prior to the telemedicine visit and am voluntarily participating in the telemedicine visit.  I understand that I have the right to withhold or withdraw my consent to the use of telemedicine in the course of my care at any time, without affecting my right to future care or treatment, and that the Practitioner or I may terminate the telemedicine visit at any time. I understand that I have the right to inspect all information obtained and/or recorded in the course of the telemedicine visit and may receive copies of available information for a reasonable fee.  I understand that some of the potential risks of receiving the Services via telemedicine include:  Delay or interruption in medical evaluation due to technological equipment failure or disruption; Information transmitted may not be  sufficient (e.g. poor resolution of images) to allow for appropriate medical decision making by the Practitioner; and/or  In rare instances, security protocols could fail, causing a breach of personal health information.  Furthermore, I acknowledge that it is my responsibility to provide information about my medical history, conditions and care that is complete and accurate to the best of my ability. I acknowledge that Practitioner's advice, recommendations, and/or decision may be based on factors not within their control, such as incomplete or inaccurate data provided by me or distortions of diagnostic images or specimens that may result from electronic transmissions. I understand that the practice of medicine is not an exact science and that Practitioner makes no warranties or guarantees regarding treatment outcomes. I acknowledge that a copy of this consent can be made available to me via my patient portal ([facility]), or I can request a printed copy by calling the office of [facility].    I understand that my insurance will be billed for this visit.   I have read or had this consent read to me. I understand the contents of this consent, which adequately explains the benefits and risks of the Services being provided via telemedicine.  I have been provided ample opportunity to ask questions regarding this consent and the Services and have had my questions answered to my satisfaction. I give my informed consent for the services to be provided through the use of telemedicine in my medical care

## 2022-09-03 NOTE — Telephone Encounter (Signed)
   Name: Christopher Weber  DOB: 1946-05-13  MRN: [id number]  Primary Cardiologist: None   Preoperative team, please contact this patient and set up a phone call appointment for further preoperative risk assessment. Please obtain consent and complete medication review. Thank you for your help.  I confirm that guidance regarding antiplatelet and oral anticoagulation therapy has been completed and, if necessary, noted below.  Pharmacy has addressed anticoagulation request   Deberah Pelton, NP 09/03/2022, 8:37 AM [place]

## 2022-09-03 NOTE — Telephone Encounter (Signed)
I s/w the pt and he has been scheduled for tele pre op appt 10/01/22 @ 2:40. Med rec and consent are done.

## 2022-09-17 DIAGNOSIS — H52203 Unspecified astigmatism, bilateral: Secondary | ICD-10-CM | POA: Diagnosis not present

## 2022-09-17 DIAGNOSIS — G47411 Narcolepsy with cataplexy: Secondary | ICD-10-CM | POA: Diagnosis not present

## 2022-09-17 DIAGNOSIS — M159 Polyosteoarthritis, unspecified: Secondary | ICD-10-CM | POA: Diagnosis not present

## 2022-09-17 DIAGNOSIS — I700 Atherosclerosis of aorta: Secondary | ICD-10-CM | POA: Diagnosis not present

## 2022-09-17 DIAGNOSIS — N529 Male erectile dysfunction, unspecified: Secondary | ICD-10-CM | POA: Diagnosis not present

## 2022-09-17 DIAGNOSIS — H2513 Age-related nuclear cataract, bilateral: Secondary | ICD-10-CM | POA: Diagnosis not present

## 2022-09-17 DIAGNOSIS — H35371 Puckering of macula, right eye: Secondary | ICD-10-CM | POA: Diagnosis not present

## 2022-09-17 DIAGNOSIS — D6869 Other thrombophilia: Secondary | ICD-10-CM | POA: Diagnosis not present

## 2022-09-17 DIAGNOSIS — I340 Nonrheumatic mitral (valve) insufficiency: Secondary | ICD-10-CM | POA: Diagnosis not present

## 2022-09-17 DIAGNOSIS — I480 Paroxysmal atrial fibrillation: Secondary | ICD-10-CM | POA: Diagnosis not present

## 2022-09-17 DIAGNOSIS — B009 Herpesviral infection, unspecified: Secondary | ICD-10-CM | POA: Diagnosis not present

## 2022-09-17 DIAGNOSIS — Z8719 Personal history of other diseases of the digestive system: Secondary | ICD-10-CM | POA: Diagnosis not present

## 2022-09-17 DIAGNOSIS — K219 Gastro-esophageal reflux disease without esophagitis: Secondary | ICD-10-CM | POA: Diagnosis not present

## 2022-10-01 ENCOUNTER — Ambulatory Visit: Payer: Medicare Other | Attending: Cardiology | Admitting: General Practice

## 2022-10-01 DIAGNOSIS — Z01810 Encounter for preprocedural cardiovascular examination: Secondary | ICD-10-CM | POA: Diagnosis not present

## 2022-10-01 NOTE — Progress Notes (Signed)
Virtual Visit via Telephone Note   Because of Christopher Weber's co-morbid illnesses, he is at least at moderate risk for complications without adequate follow up.  This format is felt to be most appropriate for this patient at this time.  The patient did not have access to video technology/had technical difficulties with video requiring transitioning to audio format only (telephone).  All issues noted in this document were discussed and addressed.  No physical exam could be performed with this format.  Please refer to the patient's chart for his consent to telehealth for [facility].  Evaluation Performed:  Preoperative cardiovascular risk assessment _____________   Date:  10/01/2022   Patient ID:  Christopher Weber, DOB 20-Mar-1946, MRN [id number] Patient Location:  Home Provider location:   Office  Primary Care Provider:  Ginger Organ., MD Primary Cardiologist: Dr. Aundra Dubin  Chief Complaint / Patient Profile   77 y.o. y/o male with a h/o paroxysmal atrial fibrillation, mitral valve insufficiency who is pending colonoscopy and presents today for telephonic preoperative cardiovascular risk assessment.  History of Present Illness    Christopher Weber is a 77 y.o. male who presents via audio/video conferencing for a telehealth visit today.  Pt was last seen in cardiology clinic on 04/29/2022 by Dr. Aundra Dubin.  At that time Christopher Weber was doing well .  The patient is now pending procedure as outlined above. Since his last visit, he remains stable from a cardiac standpoint.  Today he denies chest pain, shortness of breath, lower extremity edema, fatigue, palpitations, melena, hematuria, hemoptysis, diaphoresis, weakness, presyncope, syncope, orthopnea, and PND.   Past Medical History    Past Medical History:  Diagnosis Date   Achilles rupture, left    Actinic keratoses    Arthritis    Back pain    low   CHF (congestive heart failure) (HCC)    Colon polyp 2015    Controlled narcolepsy 08/29/2014   Diverticulitis    Dyspnea    on exertion   Fever blister    episodically   Generalized headaches    GERD (gastroesophageal reflux disease)    Hepatitis A 1985   Hypersomnia    Knee injuries 2002   left   Migraine    Migraine headache with aura 08/29/2014   Narcolepsy without cataplexy 04/12/2013    MSLT with  MSL 4.4 minutes.    Pain    facial trigemonal   Rotator cuff (capsule) sprain 08/29/2014   Seborrhea    chronic   Sinusitis    Tendinitis    left middle finger   Past Surgical History:  Procedure Laterality Date   COLONOSCOPY  2011-last   every 5 years   FINGER SURGERY  2009/2010   trigger finger of left hand     [place]   right inguinal    LASIK  1998 or [place] RELEASE  08/31/2012   Procedure: MINOR RELEASE TRIGGER FINGER/A-1 PULLEY;  Surgeon: Cammie Sickle., MD;  Location: [place];  Service: Orthopedics;  Laterality: Right;  right long finger   VASECTOMY      Allergies  Allergies  Allergen Reactions   Azithromycin Photosensitivity   Tetracyclines & Related     photosensitivity - can take medication but just must avoid being out in the sun    Doxycycline Photosensitivity   Dulcolax Stool Softener [Dss] Nausea And Vomiting  Oxycodone Rash    Home Medications    Prior to Admission medications   Medication Sig Start Date End Date Taking? Authorizing Provider  CALCIUM-MAGNESIUM-ZINC PO Take by mouth.    [provider]  ELIQUIS 5 MG TABS tablet TAKE 1 TABLET(5 MG) BY MOUTH TWICE DAILY 03/18/22   Bensimhon, Shaune Pascal, MD  metoprolol tartrate (LOPRESSOR) 25 MG tablet Take 1 tablet (25 mg total) by mouth every 8 (eight) hours as needed. For a fib 05/07/21 09/03/22  Larey Dresser, MD  mometasone (NASONEX) 50 MCG/ACT nasal spray Place 2 sprays into the nose as needed.    [provider]  Multiple Vitamin (MULTIVITAMIN)  capsule Take 1 capsule by mouth daily.    [provider]  multivitamin-lutein (OCUVITE-LUTEIN) CAPS capsule Take 1 capsule by mouth daily.    [provider]  Omeprazole-Sodium Bicarbonate (ZEGERID OTC PO) 2 tablets at bedtime. 06/29/19   [provider]  pantoprazole (PROTONIX) 20 MG tablet Take 20 mg by mouth daily.  06/29/19   [provider]  SUMAtriptan (IMITREX) 100 MG tablet TAKE 1 TABLET BY MOUTH EVERY 2 HOURS AS NEEDED FOR MIGRAINE. MAY REPEAT IN 2 HOURS IF HEADACHE PERSISTS OR RECURS 08/31/22   Dohmeier, Asencion Partridge, MD  valACYclovir (VALTREX) 500 MG tablet Take 500 mg by mouth 3 (three) times a week. Mon, [place] Fri    [provider]  VITAMIN D PO Take 1 tablet by mouth daily in the afternoon.    [provider]  XYREM 500 MG/ML SOLN Take 2 grams of xyrem by mouth twice nightly. 07/01/22   Dohmeier, Asencion Partridge, MD    Physical Exam    Vital Signs:  Christopher Weber does not have vital signs available for review today.  Given telephonic nature of communication, physical exam is limited. AAOx3. NAD. Normal affect.  Speech and respirations are unlabored.  Accessory Clinical Findings    None  Assessment & Plan    1.  Preoperative Cardiovascular Risk Assessment: Colonoscopy [facility], Dr. Paulita Fujita 10-15-22      Primary Cardiologist: Dr. Loralie Champagne  Chart reviewed as part of pre-operative protocol coverage. Given past medical history and time since last visit, based on ACC/AHA guidelines, Christopher Weber would be at acceptable risk for the planned procedure without further cardiovascular testing.   Patient with diagnosis of afib on Eliquis for anticoagulation.     Procedure: colonoscopy Date of procedure: 10/15/22   CHA2DS2-VASc Score = 1  This indicates a 0.6% annual risk of stroke. The patient's score is based upon: CHF History: 0 HTN History: 0 Diabetes History: 0 Stroke History: 0 Vascular Disease History: 0 Age Score:  1 Gender Score: 0      CrCl 75m/min Platelet count 311K   Per office protocol, patient can hold Eliquis for 1-2 days prior to procedure.    Patient was advised that if he develops new symptoms prior to surgery to contact our office to arrange a follow-up appointment.  He verbalized understanding.  I will route this recommendation to the requesting party via Epic fax function and remove from pre-op pool.       Time:   Today, I have spent 5 minutes with the patient with telehealth technology discussing medical history, symptoms, and management plan.  Prior to his phone evaluation I spent greater than 10 minutes reviewing his past medical history and cardiac medications.   JDeberah Pelton NP  10/01/2022, 8:10 AM

## 2022-10-15 DIAGNOSIS — D120 Benign neoplasm of cecum: Secondary | ICD-10-CM | POA: Diagnosis not present

## 2022-10-15 DIAGNOSIS — Z09 Encounter for follow-up examination after completed treatment for conditions other than malignant neoplasm: Secondary | ICD-10-CM | POA: Diagnosis not present

## 2022-10-15 DIAGNOSIS — K5732 Diverticulitis of large intestine without perforation or abscess without bleeding: Secondary | ICD-10-CM | POA: Diagnosis not present

## 2022-10-15 DIAGNOSIS — Z86010 Personal history of colonic polyps: Secondary | ICD-10-CM | POA: Diagnosis not present

## 2022-10-15 DIAGNOSIS — K5730 Diverticulosis of large intestine without perforation or abscess without bleeding: Secondary | ICD-10-CM | POA: Diagnosis not present

## 2022-10-15 DIAGNOSIS — D123 Benign neoplasm of transverse colon: Secondary | ICD-10-CM | POA: Diagnosis not present

## 2022-10-15 DIAGNOSIS — K648 Other hemorrhoids: Secondary | ICD-10-CM | POA: Diagnosis not present

## 2022-10-19 DIAGNOSIS — D120 Benign neoplasm of cecum: Secondary | ICD-10-CM | POA: Diagnosis not present

## 2022-10-19 DIAGNOSIS — D123 Benign neoplasm of transverse colon: Secondary | ICD-10-CM | POA: Diagnosis not present

## 2022-11-11 ENCOUNTER — Ambulatory Visit (HOSPITAL_COMMUNITY)
Admission: RE | Admit: 2022-11-11 | Discharge: 2022-11-11 | Disposition: A | Discharge status: Home / Self Care | Payer: Medicare Other | Source: Ambulatory Visit | Attending: Cardiology | Admitting: Cardiology

## 2022-11-11 ENCOUNTER — Encounter (HOSPITAL_COMMUNITY): Payer: Self-pay | Admitting: Cardiology

## 2022-11-11 VITALS — BP 104/72 | HR 63 | Wt 163.6 lb

## 2022-11-11 DIAGNOSIS — G47419 Narcolepsy without cataplexy: Secondary | ICD-10-CM | POA: Diagnosis not present

## 2022-11-11 DIAGNOSIS — I341 Nonrheumatic mitral (valve) prolapse: Secondary | ICD-10-CM | POA: Diagnosis not present

## 2022-11-11 DIAGNOSIS — R079 Chest pain, unspecified: Secondary | ICD-10-CM | POA: Diagnosis not present

## 2022-11-11 DIAGNOSIS — E785 Hyperlipidemia, unspecified: Secondary | ICD-10-CM

## 2022-11-11 DIAGNOSIS — I340 Nonrheumatic mitral (valve) insufficiency: Secondary | ICD-10-CM | POA: Diagnosis not present

## 2022-11-11 DIAGNOSIS — Z7901 Long term (current) use of anticoagulants: Secondary | ICD-10-CM | POA: Diagnosis not present

## 2022-11-11 DIAGNOSIS — I480 Paroxysmal atrial fibrillation: Secondary | ICD-10-CM | POA: Insufficient documentation

## 2022-11-11 LAB — BASIC METABOLIC PANEL
Anion gap: 10 (ref 5–15)
BUN: 14 mg/dL (ref 8–23)
CO2: 22 mmol/L (ref 22–32)
Calcium: 9.3 mg/dL (ref 8.9–10.3)
Chloride: 105 mmol/L (ref 98–111)
Creatinine, Ser: 1.37 mg/dL — ABNORMAL HIGH (ref 0.61–1.24)
GFR, Estimated: 54 mL/min — ABNORMAL LOW (ref >60)
Glucose, Bld: 100 mg/dL — ABNORMAL HIGH (ref 70–99)
Potassium: 4 mmol/L (ref 3.5–5.1)
Sodium: 137 mmol/L (ref 135–145)

## 2022-11-11 LAB — CBC
HCT: 40.9 % (ref 39.0–52.0)
Hemoglobin: 14.7 g/dL (ref 13.0–17.0)
MCH: 31.5 pg (ref 26.0–34.0)
MCHC: 35.9 g/dL (ref 30.0–36.0)
MCV: 87.8 fL (ref 80.0–100.0)
Platelets: 233 10*3/uL (ref 150–400)
RBC: 4.66 MIL/uL (ref 4.22–5.81)
RDW: 13.3 % (ref 11.5–15.5)
WBC: 4.7 10*3/uL (ref 4.0–10.5)
nRBC: 0 % (ref 0.0–0.2)

## 2022-11-11 LAB — LIPID PANEL
Cholesterol: 193 mg/dL (ref 0–200)
HDL: 52 mg/dL (ref >40)
LDL Cholesterol: 121 mg/dL — ABNORMAL HIGH (ref 0–99)
Total CHOL/HDL Ratio: 3.7 RATIO
Triglycerides: 98 mg/dL (ref <150)
VLDL: 20 mg/dL (ref 0–40)

## 2022-11-11 NOTE — Patient Instructions (Signed)
There has been no changes to your medications.  Labs done today, your results will be available in MyChart, we will contact you for abnormal readings.  Your physician has requested that you have an echocardiogram. Echocardiography is a painless test that uses sound waves to create images of your heart. It provides your doctor with information about the size and shape of your heart and how well your heart's chambers and valves are working. This procedure takes approximately one hour. There are no restrictions for this procedure. Please do NOT wear cologne, perfume, aftershave, or lotions (deodorant is allowed). Please arrive 15 minutes prior to your appointment time.  Your physician recommends that you schedule a follow-up appointment in: 6 months with your echocardiogram (August ) ** please call the office in May to arrange your follow up appointment. **  If you have any questions or concerns before your next appointment please send [place] a message through [place] or call our office at [phone].    TO LEAVE A MESSAGE FOR THE NURSE SELECT OPTION 2, PLEASE LEAVE A MESSAGE INCLUDING: YOUR NAME DATE OF BIRTH CALL BACK NUMBER REASON FOR CALL**this is important as we prioritize the call backs  YOU WILL RECEIVE A CALL BACK THE SAME DAY AS LONG AS YOU CALL BEFORE 4:00 PM  At the [facility], you and your health needs are our priority. As part of our continuing mission to provide you with exceptional heart care, we have created designated Provider Care Teams. These Care Teams include your primary Cardiologist (physician) and Advanced Practice Providers (APPs- Physician Assistants and Nurse Practitioners) who all work together to provide you with the care you need, when you need it.   You may see any of the following providers on your designated Care Team at your next follow up: Dr Glori Bickers Dr Loralie Champagne Dr. Roxana Hires, NP Lyda Jester, [facility] [place], [place] Forestine Na, NP Audry Riles, PharmD   Please be sure to bring in all your medications bottles to every appointment.    Thank you for choosing [facility]

## 2022-11-11 NOTE — Progress Notes (Signed)
PCP: Ginger Organ., MD Cardiology: Dr. Aundra Dubin  77 y.o. with history of narcolepsy and laryngopharyngeal reflux presents for followup of atrial fibrillation.  In early 2020, he developed dyspnea, a sensation that he was "breathing through a straw." He had an extensive workup in 2020 with a normal ETT, normal PFTs, and normal high resolution CT chest.  Ultimately, he was diagnosed with laryngopharyngeal reflux. Symptoms were improved considerably with Protonix + Zegerid.    With symptoms of LPR controlled, he did not have any exertional dyspnea or chest pain. In 11/21, however, he cut back on Protonix and Zegerid, and dyspnea returned.  In addition, on 11/31/21, he developed tachypalpitations.  His Apple Watch showed atrial fibrillation. He brought the tracings to show me, they were definitely atrial fibrillation. This lasted for a few hours when resolved.  In hindsight, in 4/20, he had a very similar episode of tachypalpitations that was likely atrial fibrillation as well.  He has increased his Protonix and Zegerid again and dyspnea has resolved.  HE had atrial fibrillation again on 08/30/20 documented by Apple Watch.  He does not have OSA symptoms and is followed by Dr. Brett Fairy for narcolepsy, which is controlled.    Echo was done in 12/21, EF 55-60%, mild mitral valve prolapse with probably mild MR.    Coronary CTA was done in 3/22.  This was a difficult study but calcium score 0 and no evidence for significant disease.  CT neck showed asymmetric right vallecula enlargement.  He saw ENT with examination that showed no evidence for malignancy.  He had total ankle replacement in 5/22.   Patient had diverticulitis in 9/22, treated as outpatient.   Patient tore his quadriceps tendon in 8/23 and had recent surgery to repair it.  He had a run of atrial fibrillation lasting 3-4 hours about 3 days after surgery.    Echo in 8/23 showed EF 55%, normal RV, moderate MR with posterior leaflet prolapse.     Patient returns for followup of atrial fibrillation and mitral regurgitation.  He had 1 episode of atrial fibrillation in 11/23 noted by his Apple Watch, episode occurred after he had a bout of diverticulitis and lasted for a few hours.  Since that time, no palpitations or atrial fibrillation by his Apple Watch.  No exertional dyspnea.  No chest pain.  Rides and exercise bike and lifts weights.  Was able to play tennis recently.  He drinks occasional ETOH but not heavily.    ECG (personally reviewed): NSR, normal  Labs (9/21): K 4.7, creatinine 1.42, TSH normal, hgb 14.2 Labs (12/21): BNP 17 Labs (2/22): LDL 141 Labs (5/22): K 4.5, creatinine 1.3, hgb 11.1 Labs (8/22): LDL 108 Labs (10/22): creatinine 1.29, hgb 14.5 Labs (8/23): K 3.8, creatinine 1.13  PMH: 1. HAV in 1985 2. Narcolepsy 3. Laryngopharyngeal reflux 4. Atrial fibrillation: Paroxysmal.  5. ETT (10/20): 11.6 METS, no ECG changes => normal study.  6. PFTs (10/20): Normal.  7. High resolution CT chest: Interstitial lung disease.  8. Mitral regurgitation: Echo (12/21) with EF 55-60%, mild mitral valve prolapse with probable mild MR - Echo (8/23): EF 55%, normal RV, moderate MR with posterior leaflet prolapse.  9. Coronary CTA (3/22): Calcium score 0, no evidence for significant CAD.  10. S/p total ankle replacement in 5/22.  11. 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   FH: No atrial fibrillation.  Father with "bad heart valve."   ROS: All systems  reviewed and negative except as per HPI.   Current Outpatient Medications  Medication Sig Dispense Refill   CALCIUM-MAGNESIUM-ZINC PO Patient takes 1 tablet by mouth 3 times per week.     ELIQUIS 5 MG TABS tablet TAKE 1 TABLET(5 MG) BY MOUTH TWICE DAILY 180 tablet 3   metoprolol tartrate (LOPRESSOR) 25 MG tablet Take 1 tablet (25 mg total) by mouth every 8 (eight) hours as needed. For a fib 30 tablet 5    mometasone (NASONEX) 50 MCG/ACT nasal spray Place 2 sprays into the nose as needed.     Multiple Vitamin (MULTIVITAMIN) capsule Take 1 capsule by mouth daily.     multivitamin-lutein (OCUVITE-LUTEIN) CAPS capsule Take 1 capsule by mouth daily.     Omeprazole-Sodium Bicarbonate (ZEGERID OTC PO) 2 tablets at bedtime.     pantoprazole (PROTONIX) 20 MG tablet Take 20 mg by mouth daily.      SUMAtriptan (IMITREX) 100 MG tablet TAKE 1 TABLET BY MOUTH EVERY 2 HOURS AS NEEDED FOR MIGRAINE. MAY REPEAT IN 2 HOURS IF HEADACHE PERSISTS OR RECURS 9 tablet 4   valACYclovir (VALTREX) 500 MG tablet Take 500 mg by mouth 3 (three) times a week. Mon, Wed Fri     VITAMIN D PO Take 1 tablet by mouth daily in the afternoon.     XYREM 500 MG/ML SOLN Take 2 grams of xyrem by mouth twice nightly. 270 mL 1   No current facility-administered medications for this encounter.   BP 104/72   Pulse 63   Wt 74.2 kg (163 lb 9.6 oz)   SpO2 95%   BMI 24.88 kg/m  General: NAD Neck: No JVD, no thyromegaly or thyroid nodule.  Lungs: Clear to auscultation bilaterally with normal respiratory effort. CV: Nondisplaced PMI.  Heart regular S1/S2, no S3/S4, 2/6 HSM apex.  No peripheral edema.  No carotid bruit.  Normal pedal pulses.  Abdomen: Soft, nontender, no hepatosplenomegaly, no distention.  Skin: Intact without lesions or rashes.  Neurologic: Alert and oriented x 3.  Psych: Normal affect. Extremities: No clubbing or cyanosis.  HEENT: Normal.   Assessment/plan: 1. Atrial fibrillation: Paroxysmal.  CHADSVASC 1 (age).  No OSA symptoms (followed by Dr. Brett Fairy already for narcolepsy).  Not a heavy drinker.  Weight is ideal.  Currently in NSR. TSH normal.  Echo in 8/23 showed showed mitral valve prolapse with moderate MR, normal EF 55%.  Since his last visit, he has had 1 episode of AF documented by Apple Watch.  - With CHADSVASC 1, anticoagulation not strictly indicated but can be considered if bleeding risk is low.  I think  that his bleeding risk is minimal.  He is now on Eliquis 5 mg bid. CBC and BMET today.  - We disussed options for atrial fibrillation => currently rare and minimally symptomatic, he does not want further treatment at this time.  He would be a candidate for pill-in-the-pocket flecainide or atrial fibrillation ablation if episodes become more frequent or symptomatic. - He has metoprolol tartrate to use 25 mg prn recurrent atrial fibrillation.  2. Chest pain: Negative coronary CTA in 3/22, no further chest pain.  3. Mitral valve prolapse/MR: Moderate on echo in 8/23.    - Repeat echo in 8/24 to follow.    Followup 6 months with echo.   Loralie Champagne 11/11/2022

## 2022-11-16 ENCOUNTER — Other Ambulatory Visit: Payer: Self-pay | Admitting: Neurology

## 2022-11-16 ENCOUNTER — Telehealth: Payer: Self-pay | Admitting: Neurology

## 2022-11-16 DIAGNOSIS — G43109 Migraine with aura, not intractable, without status migrainosus: Secondary | ICD-10-CM

## 2022-11-16 DIAGNOSIS — G47419 Narcolepsy without cataplexy: Secondary | ICD-10-CM

## 2022-11-16 DIAGNOSIS — G47411 Narcolepsy with cataplexy: Secondary | ICD-10-CM

## 2022-11-16 MED ORDER — XYREM 500 MG/ML PO SOLN: ORAL | 0 refills | Status: DC

## 2022-11-16 MED ORDER — SODIUM OXYBATE 500 MG/ML PO SOLN: ORAL | 0 refills | Status: DC

## 2022-11-16 NOTE — Telephone Encounter (Signed)
Script has been completed and will have Dr Brett Fairy and sign and will send to [facility] pharmacy once completed

## 2022-11-16 NOTE — Telephone Encounter (Signed)
Joe is calling from [facility] requesting a refill on XYREM 500 MG/ML SOLN. Should be sent to [facility]

## 2022-12-09 ENCOUNTER — Telehealth: Payer: Self-pay | Admitting: Neurology

## 2022-12-09 NOTE — Telephone Encounter (Signed)
PA submitted on CMM/BCBS KEY: [id number] Will await determination

## 2022-12-10 NOTE — Telephone Encounter (Signed)
Christopher Weber from [place] called stating the Xyrem med is approved from 12/09/22-12/09/23

## 2022-12-13 ENCOUNTER — Other Ambulatory Visit: Payer: Self-pay | Admitting: Neurology

## 2022-12-13 ENCOUNTER — Telehealth: Payer: Self-pay | Admitting: Neurology

## 2022-12-13 ENCOUNTER — Other Ambulatory Visit: Payer: Self-pay

## 2022-12-13 MED ORDER — SODIUM OXYBATE 500 MG/ML PO SOLN: ORAL | 5 refills | Status: DC

## 2022-12-13 MED ORDER — SODIUM OXYBATE 500 MG/ML PO SOLN: ORAL | 0 refills | Status: DC

## 2022-12-13 MED ORDER — SODIUM OXYBATE 500 MG/ML PO SOLN
ORAL | 0 refills | Status: DC
Start: 2022-12-13 — End: 2022-12-13

## 2022-12-13 MED ORDER — SODIUM OXYBATE 500 MG/ML PO SOLN: 2000.0000 mg | Freq: Every evening | ORAL | 5 refills | Status: DC

## 2022-12-13 NOTE — Telephone Encounter (Signed)
[name] called from [facility] . Stated she need a prescription sent for  Sodium Oxybate 500 MG/ML SOLN.

## 2022-12-13 NOTE — Telephone Encounter (Signed)
Pt last seen 07/01/22, Rx last filled 11/19/22, has upcoming appt 12/30/22

## 2022-12-16 ENCOUNTER — Other Ambulatory Visit (HOSPITAL_COMMUNITY): Payer: Self-pay

## 2022-12-16 MED ORDER — METOPROLOL TARTRATE 25 MG PO TABS: 25.0000 mg | ORAL_TABLET | Freq: Three times a day (TID) | ORAL | 5 refills | Status: DC | PRN

## 2022-12-16 NOTE — Telephone Encounter (Signed)
Refill of Xyrem, change in dose.

## 2022-12-30 ENCOUNTER — Encounter: Payer: Self-pay | Admitting: Neurology

## 2022-12-30 ENCOUNTER — Ambulatory Visit (INDEPENDENT_AMBULATORY_CARE_PROVIDER_SITE_OTHER): Payer: Medicare Other | Admitting: Neurology

## 2022-12-30 VITALS — BP 110/72 | HR 54 | Ht 67.0 in | Wt 164.6 lb

## 2022-12-30 DIAGNOSIS — G47411 Narcolepsy with cataplexy: Secondary | ICD-10-CM | POA: Diagnosis not present

## 2022-12-30 DIAGNOSIS — G43109 Migraine with aura, not intractable, without status migrainosus: Secondary | ICD-10-CM | POA: Diagnosis not present

## 2022-12-30 DIAGNOSIS — I480 Paroxysmal atrial fibrillation: Secondary | ICD-10-CM

## 2022-12-30 DIAGNOSIS — G47419 Narcolepsy without cataplexy: Secondary | ICD-10-CM | POA: Diagnosis not present

## 2022-12-30 MED ORDER — SUMATRIPTAN SUCCINATE 100 MG PO TABS
ORAL_TABLET | ORAL | 4 refills | Status: DC
Start: 2022-12-30 — End: 2023-07-07

## 2022-12-30 MED ORDER — SODIUM OXYBATE 500 MG/ML PO SOLN: ORAL | 5 refills | Status: DC

## 2022-12-30 MED ORDER — ZALEPLON 10 MG PO CAPS: 10.0000 mg | ORAL_CAPSULE | Freq: Every day | ORAL | 1 refills | Status: DC

## 2022-12-30 NOTE — Patient Instructions (Signed)
As discussed, Xyrem has to be taken with very mindful caution: Taking Xyrem correctly is key. This means, take it only when you are fully ready to fall asleep, while in bed and refrain from doing any other activities, even brushing  your teeth after taking your first dose. The second dose will be about 2-1/2-4 hours after his first dose. You can go to the bathroom before your 2nd dose. Take your first dose, when actually IN BED, ready to sleep. No sitting up in bed, NO reading, NO using the cell phone or computer, NO getting up to use the bathroom. Take care of everything BEFORE sleep time. Try NOT to skip the second dose as the Xyrem is not going to stay in your system long enough with only one dose. Do not drink alcohol with Xyrem. If you do drink Alcohol, you cannot take your Xyrem doses that night.    Sodium Oxybate Solution What is this medication? SODIUM OXYBATE (SOE dee um OX i bate) treats narcolepsy. It works by decreasing daytime sleepiness, which can help you sleep better at night. It also reduces the number of episodes of sudden muscle weakness (cataplexy). This medicine may be used for other purposes; ask your health care provider or pharmacist if you have questions. COMMON BRAND NAME(S): Xyrem What should I tell my care team before I take this medication? They need to know if you have any of these conditions: Depression Diet low in salt Frequently drink alcohol Heart disease High blood pressure History of substance use disorder Kidney disease Liver disease Lung or breathing disease, such as asthma or COPD Mental health conditions Sleep apnea Succinic semialdehyde dehydrogenase deficiency Suicidal thoughts, plans, or attempt An unusual or allergic reaction to sodium oxybate, other medications, foods, dyes, or preservatives Pregnant or trying to get pregnant Breastfeeding How should I use this medication? Take this medication by mouth. This medication is taken at night as 2  separate doses. Take the first dose at bedtime, while you are in bed. Lie down right away after taking the dose and remain in bed. Take the second dose 2.5 to 4 hours after the first dose. You may need to set an alarm clock to wake up. Mix each dose with one-fourth cup of water in the provided containers. Take it on an empty stomach, at least 2 hours after food. Keep taking it unless your care team tells you to stop. A special MedGuide will be given to you by the pharmacist with each prescription and refill. Be sure to read this information carefully each time. This medication comes with INSTRUCTIONS FOR USE. Ask your pharmacist for directions on how to use this medication. Read the information carefully. Talk to your pharmacist or care team if you have questions. Talk to your care team about the use of this medication in children. While this medication may be prescribed for children as young as 7 years for selected conditions, precautions do apply. Overdosage: If you think you have taken too much of this medicine contact a poison control center or emergency room at once. NOTE: This medicine is only for you. Do not share this medicine with others. What if I miss a dose? If you miss a dose, skip it. Take your next dose at the normal time. Do not take extra or 2 doses at the same time to make up for the missed dose. What may interact with this medication? Do not take this medication with any of the following: Alcohol Medications that help you  fall asleep This medication may also interact with the following: Benzodiazepines, such as alprazolam, diazepam, or lorazepam Certain antihistamines Certain medications for depression, such as amitriptyline, fluoxetine, sertraline, or trazodone Certain medications for seizures such as divalproex sodium, phenobarbital, primidone, valproic acid Medications that cause drowsiness before a procedure, such as propofol Medications that relax muscles Opioids for pain  or cough Phenothiazines, such as chlorpromazine, prochlorperazine, thioridazine This list may not describe all possible interactions. Give your health care provider a list of all the medicines, herbs, non-prescription drugs, or dietary supplements you use. Also tell them if you smoke, drink alcohol, or use illegal drugs. Some items may interact with your medicine. What should I watch for while using this medication? Visit your care team for regular checks on your progress. Tell your care team if your symptoms do not start to get better or if they get worse. This medication has a risk of abuse and dependence. Your care team will check you for this while you take this medication. This medication may affect your coordination, reaction time, or judgment. Do not engage in activities that require mental alertness, such as driving or operating machinery, for at least 6 hours after taking this medication. You may fall asleep quickly after taking this medication. Remain in bed and do not attempt to sit or stand up. Drinking alcohol with this medication can increase the risk of these side effects. You may do unusual sleep behaviors or activities you do not remember the day after taking this medication. Activities include driving, making or eating food, talking on the phone, sexual activity, or sleep walking. Stop taking this medication and call your care team right away if you find out you have done activities like this. This medication may cause thoughts of suicide or depression. This includes sudden changes in mood, behaviors, or thoughts. These changes can happen at any time but are more common in the beginning of treatment or after a change in dose. Call your care team right away if you experience these thoughts or worsening depression. What side effects may I notice from receiving this medication? Side effects that you should report to your care team as soon as possible: Allergic reactions--skin rash, itching,  hives, swelling of the face, lips, tongue, or throat CNS depression--slow or shallow breathing, shortness of breath, feeling faint, dizziness, confusion, trouble staying awake Mood and behavior changes--anxiety, nervousness, confusion, hallucinations, irritability, hostility, thoughts of suicide or self-harm, worsening mood, feelings of depression Sleep apnea--loud snoring, gasping or choking during sleep, daytime sleepiness Sleepwalking Side effects that usually do not require medical attention (report to your care team if they continue or are bothersome): Bedwetting Dizziness Headache Loss of appetite Nausea Vomiting This list may not describe all possible side effects. Call your doctor for medical advice about side effects. You may report side effects to FDA at 1-800-FDA-1088. Where should I keep my medication? Keep out of the reach of children and pets. This medication can be abused. Keep it in a safe place to protect it from theft. Do not share it with anyone. It is only for you. Selling or giving away this medication is dangerous and against the law. Store at room temperature between 20 and 25 degrees C (68 and 77 degrees F). Keep this medication in the original container. Get rid of any unused medication after the expiration date. After mixing a dose of this medication with water, the medication should be taken within 24 hours. Get rid of any unused and diluted medication. This  medication may cause harm and death if it is taken by other adults, children, or pets. It is important to get rid of the medication as soon as you no longer need it or it is expired. You can do this in two ways: Take the medication to a medication take-back program. Check with your pharmacy or law enforcement to find a location. If you cannot return the medication, flush it down the toilet. NOTE: This sheet is a summary. It may not cover all possible information. If you have questions about this medicine, talk to  your doctor, pharmacist, or health care provider.  2023 Elsevier/Gold Standard (2022-01-21 00:00:00)

## 2022-12-30 NOTE — Progress Notes (Addendum)
Provider:  Melvyn Novas, MD  Primary Care Physician:  Christopher Polka., MD [address] [place] [address]     Referring Provider: [name], Md [address] [place],  [place] [address]          Chief Complaint according to patient   Patient presents with:     Narcolepsy Patient (Initial Visit)           HISTORY OF PRESENT ILLNESS:  Christopher Weber is a 77 y.o. 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 Patient here for his refills of XYREM. Chief concern according to patient :  Patient states he migraine has gotten more frequent but no more intense, not a change in type of pain. Seasonal related?  He also has MV insufficiency.  Has paroxysmal atrial fibrillation and is rate controlled by metoprolol.  Planned ablation later this summer- Christopher Ancona, MD-  He treats migraines as soon as he senses the AURA, with Maxalt.  Visual aura and Vertigo some times, has been used to it for 25 years, seen Christopher Weber for it.  His narcolepsy hasn't been bothering him. Recently returned from [place], no problem with jet leg, will travel to [place] finally in May 2024 and [place] in August 2024, golfing. I will refill his Sonata for that trip.     RV 07-01-2022:  Interval history ; August 10th had quadriceps tendon repair through Christopher Rakers, MD and has recovered well, but post op within 48 hours had a bout of atrial fib, and 10 days post up diverticulitis. Lost 5 pounds.  Has some residual numbness on the left foot, from tibial nerve block.  Laryngopharyngeal reflux, and  mitral valve prolaps. Echo (8/23): EF 55%, normal RV, moderate MR with posterior leaflet prolapse.    Has done well with hypersomnia.  Feels disabled without XYREM- noted a big difference and is highly compliant  takes 2 doses, gets 7-8 hours of sleep. Before XYREM was initiated he was having memory loss, concentration was difficult, sleep was  fragmented and he was in danger of falling asleep when driving. 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   Still has hypnagogic hallucinations. No ankle edema.  PLan : Xyrem refill. Labs not needed. Hb A ic 5.5 / BMET was normal. 04/2022 creatinine 1.12.       Rv 06-18-2021: Christopher Weber presents for yearly RV and is treated to [facility].  Had ankle replacement and had questiont to [facility] REMS program about opiate pain medication whle continuing XYREM, was not given advice by the pharmacist.  Got a severe itch from codeine and stopped,  now on xyrem again. 5 month post surgery he is still working on ROM. Numbness on the dorsum of the right foot.  No need to switch to [facility], no HTN.  He is scheduled for Vaccine omicron today and had flu shot 14 days ago. Has a migraine 1-2 times a month, responds to triptan.  Atrial fib on eloquis. Echo was done in 12/21, EF 55-60%, mild mitral valve prolapse with probably mild MR.     Coronary CTA was done in 3/22.  This was a difficult study but calcium score 0 and no evidence for significant disease.  CT neck showed asymmetric right vallecula enlargement.  He saw ENT with examination that showed no evidence for malignancy.  He had total ankle replacement  in 5/22.    He is now out of the boot and walking, continues to do PT for his ankle.  Since I last saw him, he has had 2 episodes of atrial fibrillation which only lasted about 2 hrs.  He gets symptomatic palpitations when in AF.  No chest pain or exertional dyspnea.  Overall doing well.       Review of Systems: Out of a complete 14 system review, the patient complains of only the following symptoms, and all other reviewed systems are negative.:  Fatigue, sleepiness , migraine.    How likely are you to doze in the following situations: 0 = not likely, 1 = slight chance, 2 = moderate chance, 3 = high chance   Sitting and Reading? Watching Television? Sitting inactive in  a public place (theater or meeting)? As a passenger in a car for an hour without a break? Lying down in the afternoon when circumstances permit? Sitting and talking to someone? Sitting quietly after lunch without alcohol? In a car, while stopped for a few minutes in traffic?   Total = 3/ 24 points   FSS endorsed at 11/ 63 points.   4 grams Xyrem at 10.30 PM and 2 grams  at 2.30 AM   Social History   Socioeconomic History   Marital status: Married    Spouse name: Christopher Weber   Number of children: 2   Years of education: [employer] education level: Not on file  Occupational History    Employer: [employer]  Tobacco Use   Smoking status: Never   Smokeless tobacco: Never  Vaping Use   Vaping Use: Never used  Substance and Sexual Activity   Alcohol use: Yes    Alcohol/week: 7.0 standard drinks of alcohol    Types: 7 Cans of beer per week    Comment: consumes one beer daily   Drug use: No   Sexual activity: Not on file  Other Topics Concern   Not on file  Social History Narrative   Patient is married ([place]) and lives at home with his wife.   Patient has two adult children.   Patient is working full-time.   Patient has a Naval architect, Photographer.   Patient is right-handed.   Patient drinks two cups of coffee in the morning.   Social Determinants of Health   Financial Resource Strain: Not on file  Food Insecurity: Not on file  Transportation Needs: Not on file  Physical Activity: Not on file  Stress: Not on file  Social Connections: Not on file    Family History  Problem Relation Age of Onset   Heart disease Father 5   Hypertension Father    Prostate cancer Father    Cancer Mother        breast,colon,skin   Heart disease Mother    CVA Maternal Grandmother    Microcephaly Maternal Grandmother    Microcephaly Paternal Grandfather     Past Medical History:  Diagnosis Date   Achilles rupture, left    Actinic keratoses    Arthritis     Back pain    low   CHF (congestive heart failure)    Colon polyp 2015   Controlled narcolepsy 08/29/2014   Diverticulitis    Dyspnea    on exertion   Fever blister    episodically   Generalized headaches    GERD (gastroesophageal reflux disease)    Hepatitis A 1985   Hypersomnia    Knee injuries 2002  left   Migraine    Migraine headache with aura 08/29/2014   Narcolepsy without cataplexy 04/12/2013    MSLT with  MSL 4.4 minutes.    Pain    facial trigemonal   Rotator cuff (capsule) sprain 08/29/2014   Seborrhea    chronic   Sinusitis    Tendinitis    left middle finger    Past Surgical History:  Procedure Laterality Date   COLONOSCOPY  2011-last   every 5 years   FINGER SURGERY  2009/2010   trigger finger of left hand     HERNIA REPAIR  1963   right inguinal    LASIK  1998 or 99   TONSILLECTOMY AND ADENOIDECTOMY  1960   TRIGGER FINGER RELEASE  08/31/2012   Procedure: MINOR RELEASE TRIGGER FINGER/A-1 PULLEY;  Surgeon: Wyn Forster., MD;  Location: [facility];  Service: Orthopedics;  Laterality: Right;  right long finger   VASECTOMY       Current Outpatient Medications on File Prior to Visit  Medication Sig Dispense Refill   CALCIUM-MAGNESIUM-ZINC PO Patient takes 1 tablet by mouth 3 times per week.     ELIQUIS 5 MG TABS tablet TAKE 1 TABLET(5 MG) BY MOUTH TWICE DAILY 180 tablet 3   metoprolol tartrate (LOPRESSOR) 25 MG tablet Take 1 tablet (25 mg total) by mouth every 8 (eight) hours as needed. For a fib 30 tablet 5   mometasone (NASONEX) 50 MCG/ACT nasal spray Place 2 sprays into the nose as needed.     Multiple Vitamin (MULTIVITAMIN) capsule Take 1 capsule by mouth daily.     multivitamin-lutein (OCUVITE-LUTEIN) CAPS capsule Take 1 capsule by mouth daily.     Omeprazole-Sodium Bicarbonate (ZEGERID OTC PO) 2 tablets at bedtime.     pantoprazole (PROTONIX) 20 MG tablet Take 20 mg by mouth daily.      saccharomyces boulardii (FLORASTOR) 250  MG capsule Take 250 mg by mouth 2 (two) times daily.     Sodium Oxybate 500 MG/ML SOLN Take 2 grams =4 ml  po twice nightly 120 mL 5   SUMAtriptan (IMITREX) 100 MG tablet TAKE 1 TABLET BY MOUTH EVERY 2 HOURS AS NEEDED FOR MIGRAINE. MAY REPEAT IN 2 HOURS IF HEADACHE PERSISTS OR RECURS 9 tablet 4   valACYclovir (VALTREX) 500 MG tablet Take 500 mg by mouth 3 (three) times a week. Mon, Wed Fri     VITAMIN D PO Take 1 tablet by mouth daily in the afternoon.     zaleplon (SONATA) 10 MG capsule Take 10 mg by mouth at bedtime.     No current facility-administered medications on file prior to visit.    Allergies  Allergen Reactions   Azithromycin Photosensitivity   Tetracyclines & Related     photosensitivity - can take medication but just must avoid being out in the sun    Doxycycline Photosensitivity   Dulcolax Stool Softener [Dss] Nausea And Vomiting   Oxycodone Rash     DIAGNOSTIC DATA (LABS, IMAGING, TESTING) - I reviewed patient records, labs, notes, testing and imaging myself where available.  Lab Results  Component Value Date   WBC 4.7 11/11/2022   HGB 14.7 11/11/2022   HCT 40.9 11/11/2022   MCV 87.8 11/11/2022   PLT 233 11/11/2022      Component Value Date/Time   NA 137 11/11/2022 0930   NA 137 06/18/2021 1002   K 4.0 11/11/2022 0930   CL 105 11/11/2022 0930   CO2 22 11/11/2022 0930  GLUCOSE 100 (H) 11/11/2022 0930   BUN 14 11/11/2022 0930   BUN 17 06/18/2021 1002   CREATININE 1.37 (H) 11/11/2022 0930   CALCIUM 9.3 11/11/2022 0930   PROT 6.7 06/18/2021 1002   ALBUMIN 4.6 06/18/2021 1002   AST 23 06/18/2021 1002   ALT 20 06/18/2021 1002   ALKPHOS 64 06/18/2021 1002   BILITOT 0.4 06/18/2021 1002   GFRNONAA 54 (L) 11/11/2022 0930   GFRAA 57 (L) 06/05/2020 1511   Lab Results  Component Value Date   CHOL 193 11/11/2022   HDL 52 11/11/2022   LDLCALC 121 (H) 11/11/2022   TRIG 98 11/11/2022   CHOLHDL 3.7 11/11/2022   No results found for: "HGBA1C" No results  found for: "VITAMINB12" No results found for: "TSH"  PHYSICAL EXAM:  Today's Vitals   12/30/22 0932  BP: 110/72  Pulse: (!) 54  Weight: 164 lb 9.6 oz (74.7 kg)  Height: 5\' 7"  (1.702 m)   Body mass index is 25.78 kg/m.   Wt Readings from Last 3 Encounters:  12/30/22 164 lb 9.6 oz (74.7 kg)  11/11/22 163 lb 9.6 oz (74.2 kg)  07/01/22 159 lb (72.1 kg)     Ht Readings from Last 3 Encounters:  12/30/22 5\' 7"  (1.702 m)  07/01/22 5\' 8"  (1.727 m)  06/18/21 5\' 8"  (1.727 m)      General: Well developed, in no acute distress  Head: normocephalic and atraumatic,. Oropharynx benign  Neck: Supple,   Musculoskeletal: No deformity  Skin no rash or edema Neurological examination    Mentation: Alert oriented to time, place, history taking. Attention span and concentration appropriate. Recent and remote memory intact.  Follows all commands speech and language fluent.    Cranial nerve : No change in taste and smell, Pupils were equal round reactive to light extraocular movements were full, visual field were full on confrontational test. Facial sensation and strength were normal. hearing was intact to finger rubbing bilaterally. Uvula tongue midline. head turning and shoulder shrug were normal and symmetric.Tongue protrusion into cheek strength was normal. Motor: normal bulk and tone, full strength in the BUE, BLE,  Sensory: normal and symmetric to light touch,  Coordination: finger-nose-finger, heel-to-shin bilaterally, no dysmetria Gait and Station: Rising up from seated position without assistance, normal stance,  moderate stride, good arm swing, smooth turning, able to perform tiptoe, and heel walking without difficulty. Tandem gait is steady.    ASSESSMENT AND PLAN 77 y.o. year old male  here with:    1) Narcolepsy controlled on generic XYREM - 4 grams at 10.30 Pm and 2.AM . Epworth score is very low-   2) Migraine has seasonally increased- responds to triptans.    3) Planned  transcontinental travel, anticipating jet lag, refill Lunesta for travel.   4) atrial fib, needs screening for OSA, HST ordered.    I plan to follow up either personally or through our NP within 6 months.   I would like to thank Christopher Polka., MD and Christopher Noble, Md [address] [place],  [place] [address] for allowing me to meet with and to take care of this pleasant patient.   CC: I will share my notes with  PCP .  After spending a total time of  20  minutes face to face and additional time for physical and neurologic examination, review of laboratory studies,  personal review of imaging studies, reports and results of other testing and review of referral information / records  as far as provided in visit,   Electronically signed by: Christopher Novas, MD 12/30/2022 9:58 AM  [name] Board certified by The [employer] of Sleep Medicine and Diplomate of the [employer] of Sleep Medicine. Board certified In Neurology through the ABPN, Fellow of the [employer] of Neurology. Medical Director of [facility].

## 2022-12-30 NOTE — Addendum Note (Signed)
Addended by: Melvyn Novas on: 12/30/2022 10:23 AM   Modules accepted: Orders

## 2022-12-31 ENCOUNTER — Encounter: Payer: Self-pay | Admitting: Neurology

## 2022-12-31 ENCOUNTER — Encounter (HOSPITAL_COMMUNITY): Payer: Self-pay | Admitting: Cardiology

## 2022-12-31 ENCOUNTER — Telehealth (HOSPITAL_COMMUNITY): Payer: Self-pay

## 2022-12-31 DIAGNOSIS — Z23 Encounter for immunization: Secondary | ICD-10-CM | POA: Diagnosis not present

## 2022-12-31 DIAGNOSIS — I4891 Unspecified atrial fibrillation: Secondary | ICD-10-CM

## 2022-12-31 LAB — COMPREHENSIVE METABOLIC PANEL
ALT: 25 IU/L (ref 0–44)
AST: 28 IU/L (ref 0–40)
Albumin/Globulin Ratio: 1.9 (ref 1.2–2.2)
Albumin: 4.5 g/dL (ref 3.8–4.8)
Alkaline Phosphatase: 56 IU/L (ref 44–121)
BUN/Creatinine Ratio: 12 (ref 10–24)
BUN: 16 mg/dL (ref 8–27)
Bilirubin Total: 0.5 mg/dL (ref 0.0–1.2)
CO2: 22 mmol/L (ref 20–29)
Calcium: 10 mg/dL (ref 8.6–10.2)
Chloride: 101 mmol/L (ref 96–106)
Creatinine, Ser: 1.32 mg/dL — ABNORMAL HIGH (ref 0.76–1.27)
Globulin, Total: 2.4 g/dL (ref 1.5–4.5)
Glucose: 93 mg/dL (ref 70–99)
Potassium: 4.5 mmol/L (ref 3.5–5.2)
Sodium: 139 mmol/L (ref 134–144)
Total Protein: 6.9 g/dL (ref 6.0–8.5)
eGFR: 57 mL/min/{1.73_m2} — ABNORMAL LOW (ref >59)

## 2022-12-31 NOTE — Addendum Note (Signed)
Addended by: Linda Hedges on: 12/31/2022 10:42 AM   Modules accepted: Orders

## 2022-12-31 NOTE — Telephone Encounter (Signed)
Called patient to arrange for Zio per Dr. Shirlee Latch

## 2022-12-31 NOTE — Telephone Encounter (Signed)
Spoke with patient and got appointment arranged referral sent

## 2023-01-19 ENCOUNTER — Ambulatory Visit (INDEPENDENT_AMBULATORY_CARE_PROVIDER_SITE_OTHER): Payer: Medicare Other | Admitting: Neurology

## 2023-01-19 DIAGNOSIS — G43109 Migraine with aura, not intractable, without status migrainosus: Secondary | ICD-10-CM

## 2023-01-19 DIAGNOSIS — G47411 Narcolepsy with cataplexy: Secondary | ICD-10-CM

## 2023-01-19 DIAGNOSIS — I480 Paroxysmal atrial fibrillation: Secondary | ICD-10-CM

## 2023-01-19 DIAGNOSIS — G4733 Obstructive sleep apnea (adult) (pediatric): Secondary | ICD-10-CM | POA: Diagnosis not present

## 2023-01-19 DIAGNOSIS — G47419 Narcolepsy without cataplexy: Secondary | ICD-10-CM

## 2023-01-24 NOTE — Procedures (Signed)
        [facility] [name] , MD  "Christopher Weber"    Male, 76 y.o., 06/21/1946 MRN: [id number]   HOME SLEEP TEST REPORT ( by Watch PAT)   STUDY DATA:  01-24-2023   ORDERING CLINICIAN: Cassara Nida, MD  REFERRING CLINICIAN: William D Shaw, MD    CLINICAL INFORMATION/HISTORY: Longstanding history of Narcolepsy on xyrem, now presenting with new onset atrial fib. Screening for OSA. Planned for ablatio later this summer. Dr Dalton McLean, MD    Epworth sleepiness score: 3/24. FSS at 11/ 63 points    BMI: 25. 8 kg/m   Neck Circumference: 15"   FINDINGS:   Sleep Summary:   Total Recording Time (hours, min):     7 hours 57 minutes   Total Sleep Time (hours, min):     6 hours 40 minutes            Percent REM (%):    10%   REM sleep latency on the Xyrem medication was 242 minutes, sleep latency was 9 minutes.                                   Respiratory Indices (AASM) :   Calculated pAHI (per hour):   12.7/h                          REM pAHI:      29.2/h                                           NREM pAHI:    10.9/h                          Positional AHI: Equal amounts of sleep time were recorded in right lateral and in supine sleep position.  Right sided sleep was associated with 3.3 AHI and supine sleep was associated with an AHI of 22.6/h.  Snoring reached a mean volume of 40 dB and was only present for about 11% of the total sleep time.  Respiratory indices following CMS criteria which require 4% oxygen desaturation to be scored : AHI of 7.2 and REM AHI of 23.1/h and a NREM sleep AHI of 5.5/h.                                                  Oxygen Saturation Statistics:       O2 Saturation Range (%):   Between a nadir at 87% with a maximum of 99% with a mean saturation of 93%.                                    O2 Saturation (minutes) <89%:     0.7 minutes      Pulse Rate Statistics:   Pulse Mean (bpm):    67 bpm             Pulse Range:  Between 54 and a maximum of 94 bpm.                 IMPRESSION:  This HST confirms the presence of strongly REM sleep dependent sleep apnea with an overall mild level of obstruction.    We consider treatment of apnea with an AHI of 7.2/h optional but please note that the REM sleep AHI was 23.1/h.    The patient is already treated with a medication that suppresses REM sleep and the overall proportion of sleep REM sleep was just 10%.   RECOMMENDATION: The risk of relapsing into atrial fibrillation after an otherwise successful ablation procedure is much higher for patients with sleep apnea.  The circumstances here however are different because the patient is on a medication that can suppress REM sleep and it may be possible to suppress REM sleep even further by increasing the dose.  REM sleep broke through at about 1:10 AM and again at about 3:10 AM, and I suggest to increase the second nighttime dose of sodium oxybate by 1 g.  If this is not tolerated, I would like for Dr. Rhodes to consider CPAP auto titration. This would be with a CPAP machine by [employer], with humidifier, a setting between 4 and 13 cm water pressure 2 cm expiratory relief and interface of his choice.    INTERPRETING PHYSICIAN:   Contessa Preuss, MD   Medical Director of [facility].                       

## 2023-01-24 NOTE — Progress Notes (Signed)
[facility] Franchot Erichsen , MD  "Christopher Weber    Male, 77 y.o., [birth year] MRN: [id number]   HOME SLEEP TEST REPORT ( by Watch PAT)   STUDY DATA:  01-24-2023   ORDERING CLINICIAN: Melvyn Novas, MD  REFERRING CLINICIAN: Donnie Mesa, MD    CLINICAL INFORMATION/HISTORY: Longstanding history of Narcolepsy on xyrem, now presenting with new onset atrial fib. Screening for OSA. Planned for ablatio later this summer. Dr [name], MD    Epworth sleepiness score: 3/24. FSS at 11/ 63 points    BMI: 25. 8 kg/m   Neck Circumference: 15"   FINDINGS:   Sleep Summary:   Total Recording Time (hours, min):     7 hours 57 minutes   Total Sleep Time (hours, min):     6 hours 40 minutes            Percent REM (%):    10%   REM sleep latency on the Xyrem medication was 242 minutes, sleep latency was 9 minutes.                                   Respiratory Indices (AASM) :   Calculated pAHI (per hour):   12.7/h                          REM pAHI:      29.2/h                                           NREM pAHI:    10.9/h                          Positional AHI: Equal amounts of sleep time were recorded in right lateral and in supine sleep position.  Right sided sleep was associated with 3.3 AHI and supine sleep was associated with an AHI of 22.6/h.  Snoring reached a mean volume of 40 dB and was only present for about 11% of the total sleep time.  Respiratory indices following CMS criteria which require 4% oxygen desaturation to be scored : AHI of 7.2 and REM AHI of 23.1/h and a NREM sleep AHI of 5.5/h.                                                  Oxygen Saturation Statistics:       O2 Saturation Range (%):   Between a nadir at 87% with a maximum of 99% with a mean saturation of 93%.                                    O2 Saturation (minutes) <89%:     0.7 minutes      Pulse Rate Statistics:   Pulse Mean (bpm):    67 bpm             Pulse Range:  Between 54 and a maximum of 94 bpm.  IMPRESSION:  This HST confirms the presence of strongly REM sleep dependent sleep apnea with an overall mild level of obstruction.    We consider treatment of apnea with an AHI of 7.2/h optional but please note that the REM sleep AHI was 23.1/h.    The patient is already treated with a medication that suppresses REM sleep and the overall proportion of sleep REM sleep was just 10%.   RECOMMENDATION: The risk of relapsing into atrial fibrillation after an otherwise successful ablation procedure is much higher for patients with sleep apnea.  The circumstances here however are different because the patient is on a medication that can suppress REM sleep and it may be possible to suppress REM sleep even further by increasing the dose.  REM sleep broke through at about 1:10 AM and again at about 3:10 AM, and I suggest to increase the second nighttime dose of sodium oxybate by 1 g.  If this is not tolerated, I would like for Dr. Jenean Lindau to consider CPAP auto titration. This would be with a CPAP machine by [employer], with humidifier, a setting between 4 and 13 cm water pressure 2 cm expiratory relief and interface of his choice.    INTERPRETING PHYSICIAN:   Melvyn Novas, MD   Medical Director of [facility] at [facility].

## 2023-01-25 ENCOUNTER — Encounter: Payer: Self-pay | Admitting: Neurology

## 2023-01-25 DIAGNOSIS — M19171 Post-traumatic osteoarthritis, right ankle and foot: Secondary | ICD-10-CM | POA: Diagnosis not present

## 2023-02-10 ENCOUNTER — Inpatient Hospital Stay (HOSPITAL_COMMUNITY)
Admission: RE | Admit: 2023-02-10 | Discharge: 2023-02-10 | Disposition: A | Discharge status: Home / Self Care | Payer: Medicare Other | Source: Ambulatory Visit | Attending: Cardiology | Admitting: Cardiology

## 2023-02-10 ENCOUNTER — Ambulatory Visit (HOSPITAL_COMMUNITY)
Admission: RE | Admit: 2023-02-10 | Discharge: 2023-02-10 | Disposition: A | Discharge status: Home / Self Care | Payer: Medicare Other | Source: Ambulatory Visit | Attending: Family Medicine | Admitting: Family Medicine

## 2023-02-10 ENCOUNTER — Other Ambulatory Visit (HOSPITAL_COMMUNITY): Payer: Self-pay | Admitting: Cardiology

## 2023-02-10 DIAGNOSIS — I480 Paroxysmal atrial fibrillation: Secondary | ICD-10-CM

## 2023-02-10 DIAGNOSIS — I340 Nonrheumatic mitral (valve) insufficiency: Secondary | ICD-10-CM | POA: Diagnosis not present

## 2023-02-10 DIAGNOSIS — E785 Hyperlipidemia, unspecified: Secondary | ICD-10-CM | POA: Diagnosis not present

## 2023-02-28 DIAGNOSIS — I480 Paroxysmal atrial fibrillation: Secondary | ICD-10-CM | POA: Diagnosis not present

## 2023-03-12 ENCOUNTER — Other Ambulatory Visit (HOSPITAL_COMMUNITY): Payer: Self-pay | Admitting: Internal Medicine

## 2023-03-14 ENCOUNTER — Other Ambulatory Visit (HOSPITAL_COMMUNITY): Payer: Self-pay

## 2023-03-14 MED ORDER — APIXABAN 5 MG PO TABS: ORAL_TABLET | ORAL | 3 refills | Status: DC

## 2023-04-13 ENCOUNTER — Ambulatory Visit: Payer: Medicare Other | Attending: Cardiology | Admitting: Cardiology

## 2023-04-13 ENCOUNTER — Encounter: Payer: Self-pay | Admitting: Cardiology

## 2023-04-13 VITALS — BP 124/76 | HR 65 | Ht 67.0 in | Wt 166.8 lb

## 2023-04-13 DIAGNOSIS — G4730 Sleep apnea, unspecified: Secondary | ICD-10-CM

## 2023-04-13 DIAGNOSIS — I480 Paroxysmal atrial fibrillation: Secondary | ICD-10-CM | POA: Diagnosis not present

## 2023-04-13 NOTE — Patient Instructions (Addendum)
Medication Instructions:  Your physician recommends that you continue on your current medications as directed. Please refer to the Current Medication list given to you today.  *If you need a refill on your cardiac medications before your next appointment, please call your pharmacy*  Lab Work: BMET and CBC prior to CT scan and ablation If you have labs (blood work) drawn today and your tests are completely normal, you will receive your results only by: MyChart Message (if you have MyChart) OR A paper copy in the mail If you have any lab test that is abnormal or we need to change your treatment, we will call you to review the results.  Testing/Procedures: Your physician has requested that you have cardiac CT. Cardiac computed tomography (CT) is a painless test that uses an x-ray machine to take clear, detailed pictures of your heart. For further information please visit [email or web address] We will call you to schedule your CT scan. It will be done about one week prior to your ablation.  Your physician has recommended that you have an ablation. Catheter ablation is a medical procedure used to treat some cardiac arrhythmias (irregular heartbeats). During catheter ablation, a long, thin, flexible tube is put into a blood vessel in your groin (upper thigh), or neck. This tube is called an ablation catheter. It is then guided to your heart through the blood vessel. Radio frequency waves destroy small areas of heart tissue where abnormal heartbeats may cause an arrhythmia to start. Your ablation will be done in January 2024. We will contact you when Dr. Lovena Neighbours schedule has opened up.   Follow-Up: At [facility], you and your health needs are our priority.  As part of our continuing mission to provide you with exceptional heart care, we have created designated Provider Care Teams.  These Care Teams include your primary Cardiologist (physician) and Advanced Practice Providers (APPs -  Physician  Assistants and Nurse Practitioners) who all work together to provide you with the care you need, when you need it.  Your next appointment:   We will call you to schedule your follow up appointment

## 2023-04-13 NOTE — Progress Notes (Signed)
Electrophysiology Office Note:    Date:  04/13/2023   ID:  Christopher, Weber [birth year], MRN [id number]  [facility] Cardiologist:  None  [facility] Electrophysiologist:  Lanier Prude, MD   Referring MD: Laurey Morale, MD   Chief Complaint: Atrial fibrillation  History of Present Illness:    Christopher Weber is a 76 y.o. malewho I am seeing today for an evaluation of atrial fibrillation at the request of Dr. Shirlee Latch.  The patient was last seen by Dr. Shirlee Latch on November 11, 2022.  The patient has a medical history that includes atrial fibrillation, reflux, narcolepsy.  It seems that his atrial fibrillation was diagnosed in November 2021 when he experienced palpitations.  His Apple Watch showed atrial fibrillation.  He may have had prior episodes.  He sent a message in April of this year to Dr. Shirlee Latch regarding his atrial fibrillation episodes becoming more frequent.  He is an active man.  He plays golf regularly.  He is interested in managing his atrial fibrillation without antiarrhythmic drugs to avoid the off target effects.  No clear triggers for his arrhythmia.       Their past medical, social and family history was reveiwed.   ROS:   Please see the history of present illness.    All other systems reviewed and are negative.  EKGs/Labs/Other Studies Reviewed:    The following studies were reviewed today:  March 06, 2023 ZIO monitor personally reviewed No atrial fibrillation  EKGs in August 2023 in February 2024 shows sinus rhythm.  I have personally reviewed his Apple Watch tracings which clearly document recurrent atrial fibrillation with rapid ventricular rates.       Physical Exam:    VS:  BP 124/76 (BP Location: Left Arm, Patient Position: Sitting, Cuff Size: Normal)   Pulse 65   Ht 5\' 7"  (1.702 m)   Wt 166 lb 12.8 oz (75.7 kg)   SpO2 96%   BMI 26.12 kg/m     Wt Readings from Last 3 Encounters:  04/13/23 166 lb 12.8 oz (75.7 kg)   12/30/22 164 lb 9.6 oz (74.7 kg)  11/11/22 163 lb 9.6 oz (74.2 kg)     GEN:  Well nourished, well developed in no acute distress CARDIAC: RRR, no murmurs, rubs, gallops RESPIRATORY:  Clear to auscultation without rales, wheezing or rhonchi       ASSESSMENT AND PLAN:    1. Paroxysmal atrial fibrillation (HCC)   2. Sleep apnea, unspecified type     #Paroxysmal atrial fibrillation Symptomatic.   On Eliquis for stroke prophylaxis. Continue metoprolol.  I discussed treatment options today with the patient including continued conservative management, antiarrhythmic drugs and catheter ablation.  Discussed treatment options today for AF including antiarrhythmic drug therapy and ablation. Discussed risks, recovery and likelihood of success with each treatment strategy. Risk, benefits, and alternatives to EP study and ablation for afib were discussed. These risks include but are not limited to stroke, bleeding, vascular damage, tamponade, perforation, damage to the esophagus, lungs, phrenic nerve and other structures, pulmonary vein stenosis, worsening renal function, coronary vasospasm and death.  Discussed potential need for repeat ablation procedures and antiarrhythmic drugs after an initial ablation. The patient understands these risk and wishes to proceed.  We will therefore proceed with catheter ablation at the next available time.  Carto, ICE, anesthesia are requested for the procedure.  Will also obtain CT PV protocol prior to the procedure to exclude LAA thrombus and further evaluate atrial anatomy.  #  Obstructive sleep apnea Mild.  Follows with sleep medicine.       Signed, Rossie Muskrat. Lalla Brothers, MD, [facility], [facility] 04/13/2023 3:15 PM    Electrophysiology [facility]

## 2023-04-27 ENCOUNTER — Other Ambulatory Visit: Payer: Self-pay | Admitting: Neurology

## 2023-04-27 MED ORDER — SODIUM OXYBATE 500 MG/ML PO SOLN: ORAL | 5 refills | Status: DC

## 2023-05-06 DIAGNOSIS — D6869 Other thrombophilia: Secondary | ICD-10-CM | POA: Diagnosis not present

## 2023-05-06 DIAGNOSIS — I480 Paroxysmal atrial fibrillation: Secondary | ICD-10-CM | POA: Diagnosis not present

## 2023-05-06 DIAGNOSIS — E785 Hyperlipidemia, unspecified: Secondary | ICD-10-CM | POA: Diagnosis not present

## 2023-05-06 DIAGNOSIS — K219 Gastro-esophageal reflux disease without esophagitis: Secondary | ICD-10-CM | POA: Diagnosis not present

## 2023-05-06 DIAGNOSIS — I700 Atherosclerosis of aorta: Secondary | ICD-10-CM | POA: Diagnosis not present

## 2023-05-06 DIAGNOSIS — Z79899 Other long term (current) drug therapy: Secondary | ICD-10-CM | POA: Diagnosis not present

## 2023-05-17 ENCOUNTER — Telehealth: Payer: Self-pay | Admitting: Cardiology

## 2023-05-17 NOTE — Telephone Encounter (Signed)
Pt states that he is returning call that he received this morning. Pt also would like to know if he can have scheduled Ablation done on 09/30/2023 and not 09/23/2023. Please advise

## 2023-05-17 NOTE — Telephone Encounter (Signed)
Spoke with the patient and have saved 1/16 at 730am for him for his ablation.

## 2023-05-19 DIAGNOSIS — I340 Nonrheumatic mitral (valve) insufficiency: Secondary | ICD-10-CM | POA: Diagnosis not present

## 2023-05-19 DIAGNOSIS — Z125 Encounter for screening for malignant neoplasm of prostate: Secondary | ICD-10-CM | POA: Diagnosis not present

## 2023-05-19 DIAGNOSIS — I700 Atherosclerosis of aorta: Secondary | ICD-10-CM | POA: Diagnosis not present

## 2023-05-19 DIAGNOSIS — Z1331 Encounter for screening for depression: Secondary | ICD-10-CM | POA: Diagnosis not present

## 2023-05-19 DIAGNOSIS — D6869 Other thrombophilia: Secondary | ICD-10-CM | POA: Diagnosis not present

## 2023-05-19 DIAGNOSIS — K219 Gastro-esophageal reflux disease without esophagitis: Secondary | ICD-10-CM | POA: Diagnosis not present

## 2023-05-19 DIAGNOSIS — Z23 Encounter for immunization: Secondary | ICD-10-CM | POA: Diagnosis not present

## 2023-05-19 DIAGNOSIS — R82998 Other abnormal findings in urine: Secondary | ICD-10-CM | POA: Diagnosis not present

## 2023-05-19 DIAGNOSIS — I480 Paroxysmal atrial fibrillation: Secondary | ICD-10-CM | POA: Diagnosis not present

## 2023-05-19 DIAGNOSIS — Z1339 Encounter for screening examination for other mental health and behavioral disorders: Secondary | ICD-10-CM | POA: Diagnosis not present

## 2023-05-19 DIAGNOSIS — Z0000 Encounter for general adult medical examination without abnormal findings: Secondary | ICD-10-CM | POA: Diagnosis not present

## 2023-05-19 DIAGNOSIS — B009 Herpesviral infection, unspecified: Secondary | ICD-10-CM | POA: Diagnosis not present

## 2023-05-19 DIAGNOSIS — G47411 Narcolepsy with cataplexy: Secondary | ICD-10-CM | POA: Diagnosis not present

## 2023-05-20 ENCOUNTER — Encounter (HOSPITAL_COMMUNITY): Payer: BLUE CROSS/BLUE SHIELD | Admitting: Cardiology

## 2023-05-20 ENCOUNTER — Other Ambulatory Visit (HOSPITAL_COMMUNITY): Payer: BLUE CROSS/BLUE SHIELD

## 2023-05-24 NOTE — Progress Notes (Signed)
ADVANCED HF CLINIC NOTE   PCP: Cleatis Polka., MD Cardiology: Dr. Shirlee Latch  77 y.o. with history of narcolepsy and laryngopharyngeal reflux, mitral regurgitation and atrial fibrillation.  In early 2020, he developed dyspnea, a sensation that he was "breathing through a straw." He had an extensive workup in 2020 with a normal ETT, normal PFTs, and normal high resolution CT chest.  Ultimately, he was diagnosed with laryngopharyngeal reflux. Symptoms were improved considerably with Protonix + Zegerid.    With symptoms of LPR controlled, he did not have any exertional dyspnea or chest pain. In 11/21, however, he cut back on Protonix and Zegerid, and dyspnea returned.  In addition, on 11/31/21, he developed tachypalpitations.  His Apple Watch showed atrial fibrillation. He brought the tracings to show me, they were definitely atrial fibrillation. This lasted for a few hours when resolved.  In hindsight, in 4/20, he had a very similar episode of tachypalpitations that was likely atrial fibrillation as well.  He has increased his Protonix and Zegerid again and dyspnea has resolved.  He had atrial fibrillation again on 08/30/20 documented by Apple Watch.  He does not have OSA symptoms and is followed by Dr. Vickey Huger for narcolepsy, which is controlled.    Echo 12/21, EF 55-60%, mild mitral valve prolapse with probably mild MR.    Coronary CTA was done in 3/22.  This was a difficult study but calcium score 0 and no evidence for significant disease.  CT neck showed asymmetric right vallecula enlargement.  He saw ENT with examination that showed no evidence for malignancy.  He had total ankle replacement in 5/22.   Patient had diverticulitis in 9/22, treated as outpatient.   Patient tore his quadriceps tendon in 8/23 and had recent surgery to repair it.  He had a run of atrial fibrillation lasting 3-4 hours about 3 days after surgery.    Echo 8/23 EF 55%, normal RV, moderate MR with posterior leaflet  prolapse.    Zio 6/24: no definite a fib noted Sleep study 5/24: mild sleep apnea  Today he returns for AHF follow up. Overall feeling great. Denies palpitations, CP, dizziness, edema, or PND/Orthopnea. No SOB. Appetite good, him and his wife watch what they eat. No fever or chills. Taking all medications. Rides his exercise bike and lifts weights.  He drinks occasional ETOH but not heavily.  Recently came back from [place] on golfing trip, no SOB during games. Got covid on the flight back, symptoms resolved after a couple of days.   Echo today EF 50-55%, GIDD, RV low normal, mod-severe MR  ECG (personally reviewed): SB 50s  Labs (9/21): K 4.7, creatinine 1.42, TSH normal, hgb 14.2 Labs (12/21): BNP 17 Labs (2/22): LDL 141 Labs (5/22): K 4.5, creatinine 1.3, hgb 11.1 Labs (8/22): LDL 108 Labs (10/22): creatinine 1.29, hgb 14.5 Labs (8/23): K 3.8, creatinine 1.13 Labs (4/24): K 4.5, SCr 1.32 Labs reviewed on patient phone from last weeks PCP visit: SCr 1.1, K 4.3, LDL 99  PMH: 1. HAV in 1985 2. Narcolepsy 3. Laryngopharyngeal reflux 4. Atrial fibrillation: Paroxysmal.  - Zio 6/24: no definite a fib noted - Sleep study 5/24: mild sleep apnea 5. ETT (10/20): 11.6 METS, no ECG changes => normal study.  6. PFTs (10/20): Normal.  7. High resolution CT chest: Interstitial lung disease.  8. Mitral regurgitation: Echo (12/21) with EF 55-60%, mild mitral valve prolapse with probable mild MR - Echo (8/23): EF 55%, normal RV, moderate MR with posterior leaflet prolapse.  9. Coronary CTA (3/22): Calcium score 0, no evidence for significant CAD.  10. S/p total ankle replacement in 5/22.  11. 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   FH: No atrial fibrillation.  Father with "bad heart valve."   ROS: All systems reviewed and negative except as per HPI.   Current Outpatient Medications  Medication Sig Dispense Refill    CALCIUM-MAGNESIUM-ZINC PO Patient takes 1 tablet by mouth 3 times per week.     ELIQUIS 5 MG TABS tablet TAKE 1 TABLET(5 MG) BY MOUTH TWICE DAILY 180 tablet 3   metoprolol tartrate (LOPRESSOR) 25 MG tablet Take 1 tablet (25 mg total) by mouth every 8 (eight) hours as needed. For a fib 30 tablet 5   mometasone (NASONEX) 50 MCG/ACT nasal spray Place 2 sprays into the nose as needed.     Multiple Vitamin (MULTIVITAMIN) capsule Take 1 capsule by mouth daily.     multivitamin-lutein (OCUVITE-LUTEIN) CAPS capsule Take 1 capsule by mouth daily.     Omeprazole-Sodium Bicarbonate (ZEGERID OTC PO) 2 tablets at bedtime.     pantoprazole (PROTONIX) 20 MG tablet Take 20 mg by mouth daily.      Sodium Oxybate 500 MG/ML SOLN Take 2 grams at first dose and 2-3 hours later take 3 grams. 120 mL 5   SUMAtriptan (IMITREX) 100 MG tablet TAKE 1 TABLET BY MOUTH EVERY 2 HOURS AS NEEDED FOR MIGRAINE. MAY REPEAT IN 2 HOURS IF HEADACHE PERSISTS OR RECURS 9 tablet 4   valACYclovir (VALTREX) 500 MG tablet Take 500 mg by mouth 3 (three) times a week. Mon, Wed Fri     VITAMIN D PO Take 1 tablet by mouth daily in the afternoon.     zaleplon (SONATA) 10 MG capsule Take 1 capsule (10 mg total) by mouth at bedtime. (Patient taking differently: Take 10 mg by mouth as needed for sleep.) 21 capsule 1   No current facility-administered medications for this encounter.   BP 116/68   Pulse 63   Wt 75.1 kg (165 lb 9.6 oz)   SpO2 97%   BMI 25.94 kg/m   Physical Exam: General:  well appearing.  No respiratory difficulty. Walked into clinic HEENT: normal Neck: supple. JVD flat. Carotids 2+ bilat; no bruits. No lymphadenopathy or thyromegaly appreciated. Cor: PMI nondisplaced. Regular rate & rhythm. No rubs, gallops or murmurs. Lungs: clear Abdomen: soft, nontender, nondistended. No hepatosplenomegaly. No bruits or masses. Good bowel sounds. Extremities: no cyanosis, clubbing, rash, edema  Neuro: alert & oriented x 3, cranial  nerves grossly intact. moves all 4 extremities w/o difficulty. Affect pleasant.   Wt Readings from Last 3 Encounters:  05/26/23 75.1 kg (165 lb 9.6 oz)  04/13/23 75.7 kg (166 lb 12.8 oz)  12/30/22 74.7 kg (164 lb 9.6 oz)   Assessment/plan: 1. Atrial fibrillation: Paroxysmal.  CHADSVASC 1 (age).  No OSA symptoms (followed by Dr. Vickey Huger already for narcolepsy).  Not a heavy drinker.  Weight is ideal.  Currently in NSR. TSH normal.  Echo in 8/23 showed showed mitral valve prolapse with moderate MR, normal EF 55%. No further a fib noted since April - Zio 6/24: no definite a fib noted - Sleep study 5/24: mild sleep apnea - With CHADSVASC 1, anticoagulation not strictly indicated but can be considered if bleeding risk is low.  I think that his bleeding risk is minimal.  He is now on Eliquis 5 mg bid. Denies abnormal bleeding. CBC stable from  PCP - Follows with Dr. Lalla Brothers. Scheduled on 09/29/23 for ablation. Cardiac CT the week before.  - He has metoprolol tartrate to use 25 mg prn recurrent atrial fibrillation. ) Has taken maybe 5 since April for occasional palpitations.  2. H/o Chest pain: Negative coronary CTA in 3/22, no further chest pain.  3. Mitral valve prolapse/MR: Moderate on echo in 8/23.    - Echo today, with mod-severe MR - MR not audible on auscultation - Discussed mod-severe MR with Dr. Shirlee Latch. Will plan for 6 month echo to reevaluate post ablation. May also need TEE. Patient agreeable.   Follow up in 3 months prior to ablation. Will go ahead and schedule 6 month echo.  Alen Bleacher AGACNP-BC  05/26/2023

## 2023-05-26 ENCOUNTER — Encounter (HOSPITAL_COMMUNITY): Payer: Self-pay

## 2023-05-26 ENCOUNTER — Ambulatory Visit (HOSPITAL_BASED_OUTPATIENT_CLINIC_OR_DEPARTMENT_OTHER)
Admission: RE | Admit: 2023-05-26 | Discharge: 2023-05-26 | Disposition: A | Discharge status: Home / Self Care | Payer: Medicare Other | Source: Ambulatory Visit | Attending: Physician Assistant | Admitting: Physician Assistant

## 2023-05-26 ENCOUNTER — Ambulatory Visit (HOSPITAL_COMMUNITY)
Admission: RE | Admit: 2023-05-26 | Discharge: 2023-05-26 | Disposition: A | Discharge status: Home / Self Care | Payer: Medicare Other | Source: Ambulatory Visit | Attending: Internal Medicine | Admitting: Internal Medicine

## 2023-05-26 VITALS — BP 116/68 | HR 63 | Wt 165.6 lb

## 2023-05-26 DIAGNOSIS — G47419 Narcolepsy without cataplexy: Secondary | ICD-10-CM | POA: Diagnosis not present

## 2023-05-26 DIAGNOSIS — Z7901 Long term (current) use of anticoagulants: Secondary | ICD-10-CM | POA: Diagnosis not present

## 2023-05-26 DIAGNOSIS — Z87898 Personal history of other specified conditions: Secondary | ICD-10-CM

## 2023-05-26 DIAGNOSIS — R002 Palpitations: Secondary | ICD-10-CM | POA: Insufficient documentation

## 2023-05-26 DIAGNOSIS — I480 Paroxysmal atrial fibrillation: Secondary | ICD-10-CM | POA: Insufficient documentation

## 2023-05-26 DIAGNOSIS — G4730 Sleep apnea, unspecified: Secondary | ICD-10-CM | POA: Insufficient documentation

## 2023-05-26 DIAGNOSIS — I080 Rheumatic disorders of both mitral and aortic valves: Secondary | ICD-10-CM | POA: Diagnosis not present

## 2023-05-26 DIAGNOSIS — I340 Nonrheumatic mitral (valve) insufficiency: Secondary | ICD-10-CM | POA: Diagnosis not present

## 2023-05-26 LAB — ECHOCARDIOGRAM COMPLETE
Area-P 1/2: 3.85 cm2
Calc EF: 53 %
MV M vel: 5.01 m/s
MV Peak grad: 100.4 mmHg
Radius: 1.4 cm
S' Lateral: 3.5 cm
Single Plane A2C EF: 51.3 %
Single Plane A4C EF: 56.2 %

## 2023-05-26 NOTE — Progress Notes (Signed)
  Echocardiogram 2D Echocardiogram has been performed.  Christopher Weber 05/26/2023, 8:50 AM

## 2023-05-26 NOTE — Addendum Note (Signed)
Encounter addended by: Baird Cancer, RN on: 05/26/2023 10:32 AM  Actions taken: Order list changed, Diagnosis association updated

## 2023-05-26 NOTE — Patient Instructions (Signed)
Medication Changes:  No Changes In Medications at this time.   Follow-Up in: [place]. PLEASE CALL OUR OFFICE AROUND LATE NOVEMBER TO GET SCHEDULED FOR YOUR APPOINTMENT. PHONE NUMBER IS [phone] OPTION 2   At the [facility], you and your health needs are our priority. We have a designated team specialized in the treatment of Heart Failure. This Care Team includes your primary Heart Failure Specialized Cardiologist (physician), Advanced Practice Providers (APPs- Physician Assistants and Nurse Practitioners), and Pharmacist who all work together to provide you with the care you need, when you need it.   You may see any of the following providers on your designated Care Team at your next follow up:  Dr. Arvilla Meres Dr. [name] Dr. Marcos Eke, NP Robbie Lis, [place] [facility], [place] Brynda Peon, NP Karle Plumber, PharmD   Please be sure to bring in all your medications bottles to every appointment.   Need to Contact us:  If you have any questions or concerns before your next appointment please send [place] a message through [place] or call our office at [phone].    TO LEAVE A MESSAGE FOR THE NURSE SELECT OPTION 2, PLEASE LEAVE A MESSAGE INCLUDING: YOUR NAME DATE OF BIRTH CALL BACK NUMBER REASON FOR CALL**this is important as we prioritize the call backs  YOU WILL RECEIVE A CALL BACK THE SAME DAY AS LONG AS YOU CALL BEFORE 4:00 PM

## 2023-05-27 ENCOUNTER — Encounter (HOSPITAL_COMMUNITY): Payer: Self-pay | Admitting: Cardiology

## 2023-06-02 ENCOUNTER — Ambulatory Visit (HOSPITAL_COMMUNITY)
Admission: RE | Admit: 2023-06-02 | Discharge: 2023-06-02 | Disposition: A | Discharge status: Home / Self Care | Payer: Medicare Other | Source: Ambulatory Visit | Attending: Cardiology | Admitting: Cardiology

## 2023-06-02 ENCOUNTER — Encounter (HOSPITAL_COMMUNITY): Payer: Self-pay | Admitting: Cardiology

## 2023-06-02 ENCOUNTER — Other Ambulatory Visit (HOSPITAL_COMMUNITY): Payer: Self-pay

## 2023-06-02 VITALS — BP 112/72 | HR 71 | Wt 163.8 lb

## 2023-06-02 DIAGNOSIS — I340 Nonrheumatic mitral (valve) insufficiency: Secondary | ICD-10-CM

## 2023-06-02 DIAGNOSIS — I341 Nonrheumatic mitral (valve) prolapse: Secondary | ICD-10-CM | POA: Diagnosis not present

## 2023-06-02 DIAGNOSIS — I4891 Unspecified atrial fibrillation: Secondary | ICD-10-CM | POA: Diagnosis present

## 2023-06-02 DIAGNOSIS — I480 Paroxysmal atrial fibrillation: Secondary | ICD-10-CM | POA: Insufficient documentation

## 2023-06-02 LAB — CBC
HCT: 41.5 % (ref 39.0–52.0)
Hemoglobin: 14.6 g/dL (ref 13.0–17.0)
MCH: 31.4 pg (ref 26.0–34.0)
MCHC: 35.2 g/dL (ref 30.0–36.0)
MCV: 89.2 fL (ref 80.0–100.0)
Platelets: 222 10*3/uL (ref 150–400)
RBC: 4.65 MIL/uL (ref 4.22–5.81)
RDW: 13.2 % (ref 11.5–15.5)
WBC: 5.5 10*3/uL (ref 4.0–10.5)
nRBC: 0 % (ref 0.0–0.2)

## 2023-06-02 LAB — BASIC METABOLIC PANEL
Anion gap: 8 (ref 5–15)
BUN: 17 mg/dL (ref 8–23)
CO2: 26 mmol/L (ref 22–32)
Calcium: 9.6 mg/dL (ref 8.9–10.3)
Chloride: 100 mmol/L (ref 98–111)
Creatinine, Ser: 1.26 mg/dL — ABNORMAL HIGH (ref 0.61–1.24)
GFR, Estimated: 60 mL/min — ABNORMAL LOW (ref >60)
Glucose, Bld: 103 mg/dL — ABNORMAL HIGH (ref 70–99)
Potassium: 4.1 mmol/L (ref 3.5–5.1)
Sodium: 134 mmol/L — ABNORMAL LOW (ref 135–145)

## 2023-06-02 NOTE — Progress Notes (Signed)
Orders placed for TEE on 10/11 with Dr. Shirlee Latch

## 2023-06-02 NOTE — H&P (View-Only) (Signed)
ADVANCED HF CLINIC NOTE   PCP: Cleatis Polka., MD Cardiology: Dr. Shirlee Latch  77 y.o. with history of narcolepsy and laryngopharyngeal reflux, mitral regurgitation and atrial fibrillation.  In early 2020, he developed dyspnea, a sensation that he was "breathing through a straw." He had an extensive workup in 2020 with a normal ETT, normal PFTs, and normal high resolution CT chest.  Ultimately, he was diagnosed with laryngopharyngeal reflux. Symptoms were improved considerably with Protonix + Zegerid.    With symptoms of LPR controlled, he did not have any exertional dyspnea or chest pain. In 11/21, however, he cut back on Protonix and Zegerid, and dyspnea returned.  In addition, on 11/31/21, he developed tachypalpitations.  His Apple Watch showed atrial fibrillation. He brought the tracings to show me, they were definitely atrial fibrillation. This lasted for a few hours when resolved.  In hindsight, in 4/20, he had a very similar episode of tachypalpitations that was likely atrial fibrillation as well.  He has increased his Protonix and Zegerid again and dyspnea has resolved.  He had atrial fibrillation again on 08/30/20 documented by Apple Watch.  He does not have OSA symptoms and is followed by Dr. Vickey Huger for narcolepsy, which is controlled.    Echo 12/21, EF 55-60%, mild mitral valve prolapse with probably mild MR.    Coronary CTA was done in 3/22.  This was a difficult study but calcium score 0 and no evidence for significant disease.  CT neck showed asymmetric right vallecula enlargement.  He saw ENT with examination that showed no evidence for malignancy.  He had total ankle replacement in 5/22.   Patient had diverticulitis in 9/22, treated as outpatient.   Patient tore his quadriceps tendon in 8/23 and had recent surgery to repair it.  He had a run of atrial fibrillation lasting 3-4 hours about 3 days after surgery.    Echo 8/23 EF 55%, normal RV, moderate MR with posterior leaflet  prolapse.    Zio 6/24 showed no definite atrial fibrillation noted. Sleep study 5/24 showed mild sleep apnea.    Echo was done in 9/24 and I reviewed today, EF looks around 50-55% with low normal RV function, there is mitral valve prolapse with highly eccentric mitral regurgitation that is likely severe.   He returns today for followup of atrial fibrillation and mitral regurgitation.  Symptomatically, he has been doing very well.  He has not felt any atrial fibrillation since 4/24.  He was in [place] for 2 weeks recently, able to walk up to 10 miles/day up and down hills with no dyspnea or chest pain.  He is quite active and really denies significant exertional dyspnea. He did have COVID -19 in 8/24 but this was relatively mild.  No lightheadedness.    Labs (9/21): K 4.7, creatinine 1.42, TSH normal, hgb 14.2 Labs (12/21): BNP 17 Labs (2/22): LDL 141 Labs (5/22): K 4.5, creatinine 1.3, hgb 11.1 Labs (8/22): LDL 108 Labs (10/22): creatinine 1.29, hgb 14.5 Labs (8/23): K 3.8, creatinine 1.13 Labs (4/24): K 4.5, SCr 1.32 Labs (8/24): K 4.3, creatinine 1.1, LDL 99, TGs 50, hgb 14  PMH: 1. HAV in 1985 2. Narcolepsy 3. Laryngopharyngeal reflux 4. Atrial fibrillation: Paroxysmal.  - Zio 6/24: no definite a fib noted - Sleep study 5/24: mild sleep apnea 5. ETT (10/20): 11.6 METS, no ECG changes => normal study.  6. PFTs (10/20): Normal.  7. High resolution CT chest: Interstitial lung disease.  8. Mitral regurgitation: Echo (12/21) with EF  55-60%, mild mitral valve prolapse with probable mild MR - Echo (8/23): EF 55%, normal RV, moderate MR with posterior leaflet prolapse.  - Echo (9/24): 50-55% with low normal RV function, there is mitral valve prolapse with highly eccentric mitral regurgitation that is likely severe.  9. Coronary CTA (3/22): Calcium score 0, no evidence for significant CAD.  10. S/p total ankle replacement in 5/22.  11. 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   FH: No atrial fibrillation.  Father had mitral regurgitation.   ROS: All systems reviewed and negative except as per HPI.   Current Outpatient Medications  Medication Sig Dispense Refill   CALCIUM-MAGNESIUM-ZINC PO Patient takes 1 tablet by mouth 3 times per week.     ELIQUIS 5 MG TABS tablet TAKE 1 TABLET(5 MG) BY MOUTH TWICE DAILY 180 tablet 3   metoprolol tartrate (LOPRESSOR) 25 MG tablet Take 1 tablet (25 mg total) by mouth every 8 (eight) hours as needed. For a fib 30 tablet 5   mometasone (NASONEX) 50 MCG/ACT nasal spray Place 2 sprays into the nose as needed.     Multiple Vitamin (MULTIVITAMIN) capsule Take 1 capsule by mouth daily.     multivitamin-lutein (OCUVITE-LUTEIN) CAPS capsule Take 1 capsule by mouth daily.     Omeprazole-Sodium Bicarbonate (ZEGERID OTC PO) 2 tablets at bedtime.     pantoprazole (PROTONIX) 20 MG tablet Take 20 mg by mouth daily.      Sodium Oxybate 500 MG/ML SOLN Take 2 grams at first dose and 2-3 hours later take 3 grams. 120 mL 5   SUMAtriptan (IMITREX) 100 MG tablet TAKE 1 TABLET BY MOUTH EVERY 2 HOURS AS NEEDED FOR MIGRAINE. MAY REPEAT IN 2 HOURS IF HEADACHE PERSISTS OR RECURS 9 tablet 4   valACYclovir (VALTREX) 500 MG tablet Take 500 mg by mouth 3 (three) times a week. Mon, Wed Fri     VITAMIN D PO Take 1 tablet by mouth daily in the afternoon.     zaleplon (SONATA) 10 MG capsule Take 10 mg by mouth as needed for sleep.     No current facility-administered medications for this encounter.   BP 112/72   Pulse 71   Wt 74.3 kg (163 lb 12.8 oz)   SpO2 97%   BMI 25.65 kg/m   General: NAD Neck: No JVD, no thyromegaly or thyroid nodule.  Lungs: Clear to auscultation bilaterally with normal respiratory effort. CV: Nondisplaced PMI.  Heart regular S1/S2, no S3/S4, 3/6 HSM apex.  No peripheral edema.  No carotid bruit.  Normal pedal pulses.  Abdomen: Soft, nontender, no hepatosplenomegaly, no  distention.  Skin: Intact without lesions or rashes.  Neurologic: Alert and oriented x 3.  Psych: Normal affect. Extremities: No clubbing or cyanosis.  HEENT: Normal.   Wt Readings from Last 3 Encounters:  06/02/23 74.3 kg (163 lb 12.8 oz)  05/26/23 75.1 kg (165 lb 9.6 oz)  04/13/23 75.7 kg (166 lb 12.8 oz)   Assessment/plan: 1. Atrial fibrillation: Paroxysmal.  CHADSVASC 1 (age).  Only mild OSA on sleep study (followed by Dr. Vickey Huger already for narcolepsy).  Not a heavy drinker.  Weight is ideal.  Currently in NSR. Last documented atrial fibrillation episode was in 4/24.  He is highly symptomatic when he does go into AF.  Echo in 9/24 showed worsening of mitral regurgitation, probably now severe with mitral valve prolapse.  The mitral valve disease may be driving this atrial fibrillation.   -  With CHADSVASC 1, anticoagulation not strictly indicated but can be considered if bleeding risk is low.  I think that his bleeding risk is minimal.  He is now on Eliquis 5 mg bid. Denies abnormal bleeding. CBC stable from PCP - He is scheduled for atrial fibrillation ablation in 1/25 with Dr. Lalla Brothers, but we may arrange for MV repair/Maze prior to that.   - He has metoprolol tartrate to use 25 mg prn recurrent atrial fibrillation.   2. H/o Chest pain: Negative coronary CTA in 3/22, no further chest pain.  3. Mitral valve prolapse/MR: Father had mitral regurgitation.  Moderate on echo in 8/23. I reviewed the 9/24 echo, this showed EF in the 50-55% range with mitral valve prolapse and probably severe highly eccentric MR.  Patient really does not have significant exertional dyspnea.  However, his EF is not vigorous and he has paroxysmal atrial fibrillation. Would arrange for TEE, and if MR is truly severe, would be worthwhile considering MV repair/Maze.  - I will arrange for TEE, as above. We discussed risks/benefits and he agrees to procedure.  - If MR is severe on TEE, will need to consider MV repair +  Maze (indication for repairing MR will be EF < 60% and paroxysmal atrial fibrillation).  - If we decide on MV repair, he will need RHC/LHC.    Followup after TEE.   [name]  06/02/2023

## 2023-06-02 NOTE — Patient Instructions (Signed)
Medication Changes:  No Changes In Medications at this time.   Lab Work:  Labs done today, your results will be available in MyChart, we will contact you for abnormal readings.  Testing/Procedures:  Your physician has requested that you have a TEE. During a TEE, sound waves are used to create images of your heart. It provides your doctor with information about the size and shape of your heart and how well your heart's chambers and valves are working. In this test, a transducer is attached to the end of a flexible tube that's guided down your throat and into your esophagus (the tube leading from you mouth to your stomach) to get a more detailed image of your heart. You are not awake for the procedure. Please see the instruction sheet given to you today. For further information please visit [email or web address]   Special Instructions // Education:     Dear Christopher Weber  You are scheduled for a TEE (Transesophageal Echocardiogram) on Friday, October 11 with Dr. Shirlee Latch.  Please arrive at the [facility] (Main Entrance A) at [facility]: [address] [place], [place] [address] at 10:00 AM (This time is 1 hour(s) before your procedure to ensure your preparation). Free valet parking service is available. You will check in at ADMITTING. The support person will be asked to wait in the waiting room.  It is OK to have someone drop you off and come back when you are ready to be discharged.      DIET:  Nothing to eat or drink after midnight except a sip of water with medications (see medication instructions below)  MEDICATION INSTRUCTIONS: !!IF ANY NEW MEDICATIONS ARE STARTED AFTER TODAY, PLEASE NOTIFY YOUR PROVIDER AS SOON AS POSSIBLE!!  FYI: Medications such as Semaglutide (Ozempic, [place]), Tirzepatide (Mounjaro, Zepbound), Dulaglutide (Trulicity), etc ("GLP1 agonists") AND Canagliflozin (Invokana), Dapagliflozin (Farxiga), Empagliflozin (Jardiance), Ertugliflozin (Steglatro),  Bexagliflozin [employer]) or any combination with one of these drugs such as Invokamet (Canagliflozin/Metformin), Synjardy (Empagliflozin/Metformin), etc ("SGLT2 inhibitors") must be held around the time of a procedure. This is not a comprehensive list of all of these drugs. Please review all of your medications and talk to your provider if you take any one of these. If you are not sure, ask your provider.\        :1}Continue taking your anticoagulant (blood thinner): Apixaban (Eliquis).  You will need to continue this after your procedure until you are told by your provider that it is safe to stop.    LABS: TODAY  FYI:  For your safety, and to allow [place] to monitor your vital signs accurately during the surgery/procedure we request: If you have artificial nails, gel coating, SNS etc, please have those removed prior to your surgery/procedure. Not having the nail coverings /polish removed may result in cancellation or delay of your surgery/procedure.  You must have a responsible person to drive you home and stay in the waiting area during your procedure. Failure to do so could result in cancellation.  Bring your insurance cards.  *Special Note: Every effort is made to have your procedure done on time. Occasionally there are emergencies that occur at the hospital that may cause delays. Please be patient if a delay does occur.   Follow-Up in: 3 months as scheduled   At the [facility], you and your health needs are our priority. We have a designated team specialized in the treatment of Heart Failure. This Care Team includes your primary Heart Failure Specialized Cardiologist (  physician), Advanced Practice Providers (APPs- Physician Assistants and Nurse Practitioners), and Pharmacist who all work together to provide you with the care you need, when you need it.   You may see any of the following providers on your designated Care Team at your next follow up:  Dr. Arvilla Meres Dr. [name] Dr. Marcos Eke, NP Robbie Lis, [place] [facility] [place], [place] Brynda Peon, NP Karle Plumber, PharmD   Please be sure to bring in all your medications bottles to every appointment.   Need to Contact us:  If you have any questions or concerns before your next appointment please send [place] a message through [place] or call our office at [phone].    TO LEAVE A MESSAGE FOR THE NURSE SELECT OPTION 2, PLEASE LEAVE A MESSAGE INCLUDING: YOUR NAME DATE OF BIRTH CALL BACK NUMBER REASON FOR CALL**this is important as we prioritize the call backs  YOU WILL RECEIVE A CALL BACK THE SAME DAY AS LONG AS YOU CALL BEFORE 4:00 PM

## 2023-06-02 NOTE — Progress Notes (Signed)
ADVANCED HF CLINIC NOTE   PCP: Cleatis Polka., MD Cardiology: Dr. Shirlee Latch  77 y.o. with history of narcolepsy and laryngopharyngeal reflux, mitral regurgitation and atrial fibrillation.  In early 2020, he developed dyspnea, a sensation that he was "breathing through a straw." He had an extensive workup in 2020 with a normal ETT, normal PFTs, and normal high resolution CT chest.  Ultimately, he was diagnosed with laryngopharyngeal reflux. Symptoms were improved considerably with Protonix + Zegerid.    With symptoms of LPR controlled, he did not have any exertional dyspnea or chest pain. In 11/21, however, he cut back on Protonix and Zegerid, and dyspnea returned.  In addition, on 11/31/21, he developed tachypalpitations.  His Apple Watch showed atrial fibrillation. He brought the tracings to show me, they were definitely atrial fibrillation. This lasted for a few hours when resolved.  In hindsight, in 4/20, he had a very similar episode of tachypalpitations that was likely atrial fibrillation as well.  He has increased his Protonix and Zegerid again and dyspnea has resolved.  He had atrial fibrillation again on 08/30/20 documented by Apple Watch.  He does not have OSA symptoms and is followed by Dr. Vickey Huger for narcolepsy, which is controlled.    Echo 12/21, EF 55-60%, mild mitral valve prolapse with probably mild MR.    Coronary CTA was done in 3/22.  This was a difficult study but calcium score 0 and no evidence for significant disease.  CT neck showed asymmetric right vallecula enlargement.  He saw ENT with examination that showed no evidence for malignancy.  He had total ankle replacement in 5/22.   Patient had diverticulitis in 9/22, treated as outpatient.   Patient tore his quadriceps tendon in 8/23 and had recent surgery to repair it.  He had a run of atrial fibrillation lasting 3-4 hours about 3 days after surgery.    Echo 8/23 EF 55%, normal RV, moderate MR with posterior leaflet  prolapse.    Zio 6/24 showed no definite atrial fibrillation noted. Sleep study 5/24 showed mild sleep apnea.    Echo was done in 9/24 and I reviewed today, EF looks around 50-55% with low normal RV function, there is mitral valve prolapse with highly eccentric mitral regurgitation that is likely severe.   He returns today for followup of atrial fibrillation and mitral regurgitation.  Symptomatically, he has been doing very well.  He has not felt any atrial fibrillation since 4/24.  He was in [place] for 2 weeks recently, able to walk up to 10 miles/day up and down hills with no dyspnea or chest pain.  He is quite active and really denies significant exertional dyspnea. He did have COVID -19 in 8/24 but this was relatively mild.  No lightheadedness.    Labs (9/21): K 4.7, creatinine 1.42, TSH normal, hgb 14.2 Labs (12/21): BNP 17 Labs (2/22): LDL 141 Labs (5/22): K 4.5, creatinine 1.3, hgb 11.1 Labs (8/22): LDL 108 Labs (10/22): creatinine 1.29, hgb 14.5 Labs (8/23): K 3.8, creatinine 1.13 Labs (4/24): K 4.5, SCr 1.32 Labs (8/24): K 4.3, creatinine 1.1, LDL 99, TGs 50, hgb 14  PMH: 1. HAV in 1985 2. Narcolepsy 3. Laryngopharyngeal reflux 4. Atrial fibrillation: Paroxysmal.  - Zio 6/24: no definite a fib noted - Sleep study 5/24: mild sleep apnea 5. ETT (10/20): 11.6 METS, no ECG changes => normal study.  6. PFTs (10/20): Normal.  7. High resolution CT chest: Interstitial lung disease.  8. Mitral regurgitation: Echo (12/21) with EF  55-60%, mild mitral valve prolapse with probable mild MR - Echo (8/23): EF 55%, normal RV, moderate MR with posterior leaflet prolapse.  - Echo (9/24): 50-55% with low normal RV function, there is mitral valve prolapse with highly eccentric mitral regurgitation that is likely severe.  9. Coronary CTA (3/22): Calcium score 0, no evidence for significant CAD.  10. S/p total ankle replacement in 5/22.  11. 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   FH: No atrial fibrillation.  Father had mitral regurgitation.   ROS: All systems reviewed and negative except as per HPI.   Current Outpatient Medications  Medication Sig Dispense Refill   CALCIUM-MAGNESIUM-ZINC PO Patient takes 1 tablet by mouth 3 times per week.     ELIQUIS 5 MG TABS tablet TAKE 1 TABLET(5 MG) BY MOUTH TWICE DAILY 180 tablet 3   metoprolol tartrate (LOPRESSOR) 25 MG tablet Take 1 tablet (25 mg total) by mouth every 8 (eight) hours as needed. For a fib 30 tablet 5   mometasone (NASONEX) 50 MCG/ACT nasal spray Place 2 sprays into the nose as needed.     Multiple Vitamin (MULTIVITAMIN) capsule Take 1 capsule by mouth daily.     multivitamin-lutein (OCUVITE-LUTEIN) CAPS capsule Take 1 capsule by mouth daily.     Omeprazole-Sodium Bicarbonate (ZEGERID OTC PO) 2 tablets at bedtime.     pantoprazole (PROTONIX) 20 MG tablet Take 20 mg by mouth daily.      Sodium Oxybate 500 MG/ML SOLN Take 2 grams at first dose and 2-3 hours later take 3 grams. 120 mL 5   SUMAtriptan (IMITREX) 100 MG tablet TAKE 1 TABLET BY MOUTH EVERY 2 HOURS AS NEEDED FOR MIGRAINE. MAY REPEAT IN 2 HOURS IF HEADACHE PERSISTS OR RECURS 9 tablet 4   valACYclovir (VALTREX) 500 MG tablet Take 500 mg by mouth 3 (three) times a week. Mon, Wed Fri     VITAMIN D PO Take 1 tablet by mouth daily in the afternoon.     zaleplon (SONATA) 10 MG capsule Take 10 mg by mouth as needed for sleep.     No current facility-administered medications for this encounter.   BP 112/72   Pulse 71   Wt 74.3 kg (163 lb 12.8 oz)   SpO2 97%   BMI 25.65 kg/m   General: NAD Neck: No JVD, no thyromegaly or thyroid nodule.  Lungs: Clear to auscultation bilaterally with normal respiratory effort. CV: Nondisplaced PMI.  Heart regular S1/S2, no S3/S4, 3/6 HSM apex.  No peripheral edema.  No carotid bruit.  Normal pedal pulses.  Abdomen: Soft, nontender, no hepatosplenomegaly, no  distention.  Skin: Intact without lesions or rashes.  Neurologic: Alert and oriented x 3.  Psych: Normal affect. Extremities: No clubbing or cyanosis.  HEENT: Normal.   Wt Readings from Last 3 Encounters:  06/02/23 74.3 kg (163 lb 12.8 oz)  05/26/23 75.1 kg (165 lb 9.6 oz)  04/13/23 75.7 kg (166 lb 12.8 oz)   Assessment/plan: 1. Atrial fibrillation: Paroxysmal.  CHADSVASC 1 (age).  Only mild OSA on sleep study (followed by Dr. Vickey Huger already for narcolepsy).  Not a heavy drinker.  Weight is ideal.  Currently in NSR. Last documented atrial fibrillation episode was in 4/24.  He is highly symptomatic when he does go into AF.  Echo in 9/24 showed worsening of mitral regurgitation, probably now severe with mitral valve prolapse.  The mitral valve disease may be driving this atrial fibrillation.   -  With CHADSVASC 1, anticoagulation not strictly indicated but can be considered if bleeding risk is low.  I think that his bleeding risk is minimal.  He is now on Eliquis 5 mg bid. Denies abnormal bleeding. CBC stable from PCP - He is scheduled for atrial fibrillation ablation in 1/25 with Dr. Lalla Brothers, but we may arrange for MV repair/Maze prior to that.   - He has metoprolol tartrate to use 25 mg prn recurrent atrial fibrillation.   2. H/o Chest pain: Negative coronary CTA in 3/22, no further chest pain.  3. Mitral valve prolapse/MR: Father had mitral regurgitation.  Moderate on echo in 8/23. I reviewed the 9/24 echo, this showed EF in the 50-55% range with mitral valve prolapse and probably severe highly eccentric MR.  Patient really does not have significant exertional dyspnea.  However, his EF is not vigorous and he has paroxysmal atrial fibrillation. Would arrange for TEE, and if MR is truly severe, would be worthwhile considering MV repair/Maze.  - I will arrange for TEE, as above. We discussed risks/benefits and he agrees to procedure.  - If MR is severe on TEE, will need to consider MV repair +  Maze (indication for repairing MR will be EF < 60% and paroxysmal atrial fibrillation).  - If we decide on MV repair, he will need RHC/LHC.    Followup after TEE.   Christopher Weber  06/02/2023

## 2023-06-23 NOTE — OR Nursing (Signed)
Called patient with pre-procedure instructions for tomorrow.   Patient informed of:   Time to arrive for procedure. 0900 Remain NPO past midnight.  Must have a ride home and a responsible adult to remain with them for 24 ours post procedure.  Confirmed blood thinner. Confirmed no breaks in taking blood thinner for 3+ weeks prior to procedure.

## 2023-06-24 ENCOUNTER — Ambulatory Visit (HOSPITAL_COMMUNITY): Payer: Medicare Other | Admitting: Anesthesiology

## 2023-06-24 ENCOUNTER — Ambulatory Visit (HOSPITAL_COMMUNITY)
Admission: RE | Admit: 2023-06-24 | Discharge: 2023-06-24 | Disposition: A | Discharge status: Home / Self Care | Payer: Medicare Other | Attending: Cardiology | Admitting: Cardiology

## 2023-06-24 ENCOUNTER — Ambulatory Visit (HOSPITAL_COMMUNITY): Payer: Medicare Other

## 2023-06-24 ENCOUNTER — Other Ambulatory Visit: Payer: Self-pay

## 2023-06-24 ENCOUNTER — Encounter (HOSPITAL_COMMUNITY)
Admission: RE | Disposition: A | Discharge status: Home / Self Care | Payer: Self-pay | Source: Home / Self Care | Attending: Cardiology

## 2023-06-24 ENCOUNTER — Other Ambulatory Visit (HOSPITAL_COMMUNITY): Payer: Self-pay

## 2023-06-24 DIAGNOSIS — I341 Nonrheumatic mitral (valve) prolapse: Secondary | ICD-10-CM | POA: Insufficient documentation

## 2023-06-24 DIAGNOSIS — I340 Nonrheumatic mitral (valve) insufficiency: Secondary | ICD-10-CM

## 2023-06-24 DIAGNOSIS — G47419 Narcolepsy without cataplexy: Secondary | ICD-10-CM | POA: Diagnosis not present

## 2023-06-24 DIAGNOSIS — K219 Gastro-esophageal reflux disease without esophagitis: Secondary | ICD-10-CM | POA: Diagnosis not present

## 2023-06-24 DIAGNOSIS — I480 Paroxysmal atrial fibrillation: Secondary | ICD-10-CM | POA: Insufficient documentation

## 2023-06-24 DIAGNOSIS — Z7901 Long term (current) use of anticoagulants: Secondary | ICD-10-CM | POA: Diagnosis not present

## 2023-06-24 HISTORY — PX: TEE WITHOUT CARDIOVERSION: SHX5443

## 2023-06-24 SURGERY — ECHOCARDIOGRAM, TRANSESOPHAGEAL
Anesthesia: Monitor Anesthesia Care

## 2023-06-24 MED ORDER — PROPOFOL 10 MG/ML IV BOLUS
INTRAVENOUS | Status: DC | PRN
Start: 2023-06-24 — End: 2023-06-24
  Administered 2023-06-24 (×3): 20 mg via INTRAVENOUS
  Administered 2023-06-24: 50 mg via INTRAVENOUS
  Administered 2023-06-24: 30 mg via INTRAVENOUS

## 2023-06-24 MED ORDER — GLYCOPYRROLATE PF 0.2 MG/ML IJ SOSY
PREFILLED_SYRINGE | INTRAMUSCULAR | Status: DC | PRN
Start: 2023-06-24 — End: 2023-06-24
  Administered 2023-06-24: .2 mg via INTRAVENOUS

## 2023-06-24 MED ORDER — LIDOCAINE 2% (20 MG/ML) 5 ML SYRINGE
INTRAMUSCULAR | Status: DC | PRN
  Administered 2023-06-24: 80 mg via INTRAVENOUS

## 2023-06-24 MED ORDER — SODIUM CHLORIDE 0.9 % IV SOLN: INTRAVENOUS | Status: AC

## 2023-06-24 MED ORDER — PHENYLEPHRINE 80 MCG/ML (10ML) SYRINGE FOR IV PUSH (FOR BLOOD PRESSURE SUPPORT)
PREFILLED_SYRINGE | INTRAVENOUS | Status: DC | PRN
  Administered 2023-06-24: 80 ug via INTRAVENOUS
  Administered 2023-06-24: 160 ug via INTRAVENOUS

## 2023-06-24 NOTE — Discharge Instructions (Signed)
Transesophageal Echocardiogram Transesophageal echocardiogram (TEE) is a test that uses sound waves to take pictures of your heart. TEE is done by passing a small probe attached to a flexible tube down the part of the body that moves food from your mouth to your stomach (esophagus). The pictures give clear images of your heart. This can help your doctor see if there are problems with your heart. Tell a doctor about: Any allergies you have. All medicines you are taking. This includes vitamins, herbs, eye drops, creams, and over-the-counter medicines. Any problems you or family members have had with anesthetic medicines. Any blood disorders you have. Any surgeries you have had. Any medical conditions you have. Any swallowing problems. Whether you have or have had a blockage in the part of the body that moves food from your mouth to your stomach. Whether you are pregnant or may be pregnant. What are the risks? In general, this is a safe procedure. But, problems may occur, such as: Damage to nearby structures or organs. A tear in the part of the body that moves food from your mouth to your stomach. Irregular heartbeat. Hoarse voice or trouble swallowing. Bleeding. What happens before the procedure? Medicines Ask your doctor about changing or stopping: Your normal medicines. Vitamins, herbs, and supplements. Over-the-counter medicines. Do not take aspirin or ibuprofen unless you are told to. General instructions Follow instructions from your doctor about what you cannot eat or drink. You will take out any dentures or dental retainers. Plan to have a responsible adult take you home from the hospital or clinic. Plan to have a responsible adult care for you for the time you are told after you leave the hospital or clinic. This is important. What happens during the procedure?  An IV will be put into one of your veins. You may be given: A sedative. This medicine helps you relax. A medicine  to numb the back of your throat. This may be sprayed or gargled. Your blood pressure, heart rate, and breathing will be watched. You may be asked to lie on your left side. A bite block will be placed in your mouth. This keeps you from biting the tube. The tip of the probe will be placed into the back of your mouth. You will be asked to swallow. Your doctor will take pictures of your heart. The probe and bite block will be taken out after the test is done. The procedure may vary among doctors and hospitals. What can I expect after the procedure? You will be monitored until you leave the hospital or clinic. This includes checking your blood pressure, heart rate, breathing rate, and blood oxygen level. Your throat may feel sore and numb. This will get better over time. You will not be allowed to eat or drink until the numbness has gone away. It is common to have a sore throat for a day or two. It is up to you to get the results of your procedure. Ask how to get your results when they are ready. Follow these instructions at home: If you were given a sedative during your procedure, do not drive or use machines until your doctor says that it is safe. Return to your normal activities when your doctor says that it is safe. Keep all follow-up visits. Summary TEE is a test that uses sound waves to take pictures of your heart. You will be given a medicine to help you relax. Do not drive or use machines until your doctor says that it  is safe. This information is not intended to replace advice given to you by your health care provider. Make sure you discuss any questions you have with your health care provider. Document Revised: 05/13/2021 Document Reviewed: 04/22/2020 Elsevier Patient Education  2024 [employer].

## 2023-06-24 NOTE — Transfer of Care (Signed)
Immediate Anesthesia Transfer of Care Note  Patient: BENN TARVER  Procedure(s) Performed: TRANSESOPHAGEAL ECHOCARDIOGRAM  Patient Location: PACU  Anesthesia Type:MAC  Level of Consciousness: awake, oriented, and patient cooperative  Airway & Oxygen Therapy: Patient Spontanous Breathing and Patient connected to face mask oxygen  Post-op Assessment: Report given to RN and Post -op Vital signs reviewed and stable  Post vital signs: Reviewed and stable  Last Vitals:  Vitals Value Taken Time  BP 91/69 06/24/23 1245  Temp 36.4 C 06/24/23 1243  Pulse 63 06/24/23 1249  Resp 18 06/24/23 1249  SpO2 97 % 06/24/23 1249  Vitals shown include unfiled device data.  Last Pain:  Vitals:   06/24/23 1243  TempSrc: Temporal  PainSc: 0-No pain         Complications: No notable events documented.

## 2023-06-24 NOTE — CV Procedure (Signed)
Procedure: TEE   Indication: Mitral regurgitation  Sedation: Per anesthesiology  Findings: Please see echo section for full report.  He has severe mitral regurgitation with a partial flail P1 leaflet and a highly eccentric jet of MR heading laterally to medially.  LV EF 55-60% range.   [name] 06/24/2023 12:42 PM

## 2023-06-24 NOTE — Anesthesia Preprocedure Evaluation (Addendum)
Anesthesia Evaluation  Patient identified by MRN, date of birth, ID band Patient awake    Reviewed: Allergy & Precautions, NPO status , Patient's Chart, lab work & pertinent test results  History of Anesthesia Complications Negative for: history of anesthetic complications  Airway Mallampati: II  TM Distance: >3 FB Neck ROM: Full    Dental no notable dental hx.    Pulmonary neg pulmonary ROS   Pulmonary exam normal        Cardiovascular hypertension, Pt. on medications +CHF  Normal cardiovascular exam  TTE 05/26/23:  1. Left ventricular ejection fraction, by estimation, is 50 to 55%. The  left ventricle has low normal function. The left ventricle has no regional  wall motion abnormalities. Left ventricular diastolic parameters are  consistent with Grade I diastolic  dysfunction (impaired relaxation). The average left ventricular global  longitudinal strain is -19.6 %. The global longitudinal strain is normal.   2. Right ventricular systolic function is low normal. The right  ventricular size is normal.   3. Difficult to evaluate severity due to eccentric jet.. The mitral valve  is abnormal. Moderate to severe mitral valve regurgitation. No evidence of  mitral stenosis. There is moderate holosystolic prolapse of of the mitral  valve.   4. The aortic valve is tricuspid. Aortic valve regurgitation is trivial.  No aortic stenosis is present.   5. Aortic dilatation noted. There is dilatation of the ascending aorta,  measuring 39 mm. There is dilatation of the aortic root, measuring 39 mm.      Neuro/Psych  Headaches narcolepsy    GI/Hepatic Neg liver ROS,GERD  Medicated,,  Endo/Other  negative endocrine ROS    Renal/GU negative Renal ROS  negative genitourinary   Musculoskeletal  (+) Arthritis ,    Abdominal   Peds  Hematology negative hematology ROS (+)   Anesthesia Other Findings Day of surgery medications  reviewed with patient.  Reproductive/Obstetrics negative OB ROS                              Anesthesia Physical Anesthesia Plan  ASA: 4  Anesthesia Plan: MAC   Post-op Pain Management: Minimal or no pain anticipated   Induction:   PONV Risk Score and Plan: Treatment may vary due to age or medical condition and Propofol infusion  Airway Management Planned: Natural Airway and Nasal Cannula  Additional Equipment: None  Intra-op Plan:   Post-operative Plan:   Informed Consent: I have reviewed the patients History and Physical, chart, labs and discussed the procedure including the risks, benefits and alternatives for the proposed anesthesia with the patient or authorized representative who has indicated his/her understanding and acceptance.       Plan Discussed with: CRNA  Anesthesia Plan Comments:          Anesthesia Quick Evaluation

## 2023-06-24 NOTE — Anesthesia Postprocedure Evaluation (Signed)
Anesthesia Post Note  Patient: Christopher Weber  Procedure(s) Performed: TRANSESOPHAGEAL ECHOCARDIOGRAM     Patient location during evaluation: PACU Anesthesia Type: MAC Level of consciousness: awake and alert Pain management: pain level controlled Vital Signs Assessment: post-procedure vital signs reviewed and stable Respiratory status: spontaneous breathing, nonlabored ventilation and respiratory function stable Cardiovascular status: blood pressure returned to baseline Postop Assessment: no apparent nausea or vomiting Anesthetic complications: no   No notable events documented.  Last Vitals:  Vitals:   06/24/23 1255 06/24/23 1300  BP: (!) 127/91 (!) 111/56  Pulse: 65 (!) 59  Resp: 20 19  Temp:  36.4 C  SpO2: 98% 98%    Last Pain:  Vitals:   06/24/23 1300  TempSrc: Temporal  PainSc: 0-No pain                 Shanda Howells

## 2023-06-24 NOTE — Interval H&P Note (Signed)
History and Physical Interval Note:  06/24/2023 12:09 PM  Christopher Weber  has presented today for surgery, with the diagnosis of MITRO REGURG.  The various methods of treatment have been discussed with the patient and family. After consideration of risks, benefits and other options for treatment, the patient has consented to  Procedure(s): TRANSESOPHAGEAL ECHOCARDIOGRAM (N/A) as a surgical intervention.  The patient's history has been reviewed, patient examined, no change in status, stable for surgery.  I have reviewed the patient's chart and labs.  Questions were answered to the patient's satisfaction.     Galadriel Shroff [employer]

## 2023-06-27 ENCOUNTER — Encounter (HOSPITAL_COMMUNITY): Payer: Self-pay | Admitting: Cardiology

## 2023-06-27 LAB — ECHO TEE
MV M vel: 5.29 m/s
MV Peak grad: 111.9 mm[Hg]
Radius: 1 cm

## 2023-06-28 ENCOUNTER — Encounter (HOSPITAL_COMMUNITY): Payer: Self-pay | Admitting: Cardiology

## 2023-07-07 ENCOUNTER — Ambulatory Visit (INDEPENDENT_AMBULATORY_CARE_PROVIDER_SITE_OTHER): Payer: Medicare Other | Admitting: Neurology

## 2023-07-07 ENCOUNTER — Encounter: Payer: Self-pay | Admitting: Neurology

## 2023-07-07 VITALS — BP 127/81 | HR 62 | Ht 67.0 in | Wt 167.0 lb

## 2023-07-07 DIAGNOSIS — G43109 Migraine with aura, not intractable, without status migrainosus: Secondary | ICD-10-CM

## 2023-07-07 DIAGNOSIS — I480 Paroxysmal atrial fibrillation: Secondary | ICD-10-CM | POA: Diagnosis not present

## 2023-07-07 DIAGNOSIS — G43809 Other migraine, not intractable, without status migrainosus: Secondary | ICD-10-CM | POA: Diagnosis not present

## 2023-07-07 DIAGNOSIS — I340 Nonrheumatic mitral (valve) insufficiency: Secondary | ICD-10-CM | POA: Diagnosis not present

## 2023-07-07 DIAGNOSIS — G47419 Narcolepsy without cataplexy: Secondary | ICD-10-CM

## 2023-07-07 MED ORDER — NURTEC 75 MG PO TBDP: ORAL_TABLET | ORAL | 1 refills | Status: DC

## 2023-07-07 MED ORDER — SODIUM OXYBATE 500 MG/ML PO SOLN: ORAL | 5 refills | Status: DC

## 2023-07-07 NOTE — Progress Notes (Signed)
Provider:  Melvyn Novas, MD  Primary Care Physician:  Christopher Polka., MD [address] 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  He is doing well, has become a grandparent in the recent years, still plays tennis and loves to travel with Christopher Weber, his wife.  He has developed severe MV regurgitation. He  caught Covid on his flight back to the [place].    Chief concern according to patient : " In May we went to the [place], [place] and [place], walking all the time, no SOB-" then things changed;  He has had a medical exam and was found to have a now severe murmur- 18 months ago , he has had a much lower regurgitation on Echo than now . He developed severe MV regurgitation, with little symptoms, but putting him at risk for pulmonary HTN, and needs urgent treatment now.   He reports 4 migraines a months.  He has a slight dizziness in AM, vestibular migraine- some with aura: zig-zag lines are his visual aura, dominating the right lateral vision.  Some times he has no headache to follow.  Uses Imitrex.  I like to replace with ubrelvy- nurtec for his cardiac condition.     Christopher Weber is a 76 y.o. 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 Patient here for his refills of XYREM. Chief concern according to patient :  Patient states he migraine has gotten more frequent but no more intense, not a change in type of pain. Seasonal related?  He also has MV insufficiency.  Has paroxysmal atrial fibrillation and is rate controlled by metoprolol.  Planned ablation later this summer- Christopher Ancona, MD-  He treats migraines as soon as he senses the AURA, with Maxalt.  Visual aura and Vertigo some times, has been used to it for 25 years, seen Christopher Weber for it.  His narcolepsy hasn't been bothering him. Recently returned from [place], no problem with jet leg, will travel to [place] finally in May 2024 and [place] in August 2024, golfing. I will refill his Sonata for that trip.      RV 07-01-2022:  Interval history ; August 10th had quadriceps tendon repair through Christopher Rakers, MD and has recovered well, but post op within 48 hours had a bout of atrial fib, and 10 days post up diverticulitis. Lost 5 pounds.  Has some residual numbness on the left foot, from tibial nerve block.  Laryngopharyngeal reflux, and  mitral valve prolaps. Echo (8/23): EF 55%, normal RV, moderate MR with posterior leaflet prolapse.    Has done well with hypersomnia.  Feels disabled without XYREM- noted a big difference and is highly compliant  takes 2 doses, gets 7-8 hours of sleep. Before XYREM was initiated he was having memory loss, concentration was difficult, sleep was fragmented and he was in danger of falling asleep when driving. He was considering early retirement or disability from his psychiatry  practice.   Still has hypnagogic hallucinations. No ankle edema.  PLan : Xyrem refill. Labs not needed. Hb A ic 5.5 / BMET was normal. 04/2022 creatinine 1.12.    Rv 06-18-2021: Christopher Weber presents for yearly RV and is treated to [facility].  Had ankle replacement and had questiont to [facility] program about opiate pain medication whle continuing XYREM, was not given advice by the pharmacist.  Got a severe itch from codeine and stopped,  now on xyrem again. 5 month post surgery he is still working on ROM. Numbness on the dorsum of the right foot.  No need to switch to [facility], no HTN.  He is scheduled for Vaccine omicron today and had flu shot 14 days ago. Has a migraine 1-2 times a month, responds to triptan.      Review of Systems: Out of a complete 14 system review, the patient complains of only the following symptoms, and all other reviewed systems are negative.:  Fatigue, sleepiness , migraine .    How likely are you to doze in the following situations: 0 = not likely, 1 = slight chance, 2 = moderate chance, 3 = high chance   Sitting and Reading? Watching Television? Sitting inactive in a public place (theater or meeting)? As a passenger in a car for an hour without a break? Lying down in the afternoon when circumstances permit? Sitting and talking to someone? Sitting quietly after lunch without alcohol? In a car, while stopped for a few minutes in traffic?   Total = 7/ 24 points   FSS endorsed at 11/ 63 points.    A fib and  migraine  Total = 3/ 24 points   FSS endorsed at 11/ 63 points.  4 grams Xyrem at 10.30 PM and 2 grams  at 2.30 AM   Social History   Socioeconomic History   Marital status: Married    Spouse name: Christopher Weber   Number of children: 2   Years of education: Doctor, hospital , post grad    Highest education level: MD, medical school   Occupational History    Employer: [employer]   Tobacco Use   Smoking status: Never   Smokeless tobacco: Never  Vaping Use   Vaping status: Never Used  Substance and Sexual Activity   Alcohol use: Yes    Alcohol/week: 7.0 standard drinks of alcohol    Types: 7 Cans of beer per week    Comment: consumes one beer daily   Drug use: No   Sexual activity: Not on file  Other Topics Concern   Not on file  Social History Narrative   Patient is married (Christopher Weber) and lives at home with his wife.   Patient has two adult children.   Patient is working full-time.   Patient has a Naval architect, Photographer.   Patient is right-handed.   Patient drinks two cups of coffee in the morning.   Social Determinants of Health   Financial Resource Strain: Not on file  Food Insecurity: Not on file  Transportation Needs: Not  on file  Physical Activity: Not on file  Stress: Not on file  Social Connections: Not on file    Family History  Problem Relation Age of Onset   Heart disease Father 67   Hypertension Father    Prostate cancer Father    Cancer Mother        breast,colon,skin   Heart disease Mother    CVA Maternal Grandmother  Microcephaly Maternal Grandmother    Microcephaly Paternal Grandfather     Past Medical History:  Diagnosis Date   Achilles rupture, left    Actinic keratoses    Arthritis    Back pain    low   CHF (congestive heart failure) (HCC)    Colon polyp 2015   Controlled narcolepsy 08/29/2014   Diverticulitis    Dyspnea    on exertion   Fever blister    episodically   Generalized headaches    GERD (gastroesophageal reflux disease)    Hepatitis A 1985   Hypersomnia    Knee injuries 2002   left   Migraine    Migraine headache with aura 08/29/2014   Narcolepsy without cataplexy 04/12/2013    MSLT with  MSL 4.4 minutes.    Pain    facial trigemonal   Rotator cuff (capsule) sprain 08/29/2014   Seborrhea    chronic   Sinusitis    Tendinitis    left middle finger    Past Surgical History:  Procedure Laterality Date   COLONOSCOPY  2011-last   every 5 years   FINGER SURGERY  2009/2010   trigger finger of left hand     HERNIA REPAIR  1963   right inguinal    LASIK  1998 or 99   TEE WITHOUT CARDIOVERSION N/A 06/24/2023   Procedure: TRANSESOPHAGEAL ECHOCARDIOGRAM;  Surgeon: Laurey Morale, MD;  Location: [facility] INVASIVE CV LAB;  Service: Cardiovascular;  Laterality: N/A;   TONSILLECTOMY AND ADENOIDECTOMY  1960   TRIGGER FINGER RELEASE  08/31/2012   Procedure: MINOR RELEASE TRIGGER FINGER/A-1 PULLEY;  Surgeon: Wyn Forster., MD;  Location: [facility];  Service: Orthopedics;  Laterality: Right;  right long finger   VASECTOMY       Current Outpatient Medications on File Prior to Visit  Medication Sig Dispense Refill   acetaminophen (TYLENOL)  325 MG tablet Take 325 mg by mouth every 6 (six) hours as needed for moderate pain.     CALCIUM-MAGNESIUM-ZINC PO Take 1 tablet by mouth 3 (three) times a week.     ELIQUIS 5 MG TABS tablet TAKE 1 TABLET(5 MG) BY MOUTH TWICE DAILY 180 tablet 3   metoprolol tartrate (LOPRESSOR) 25 MG tablet Take 1 tablet (25 mg total) by mouth every 8 (eight) hours as needed. For a fib 30 tablet 5   Multiple Vitamin (MULTIVITAMIN) capsule Take 1 capsule by mouth 3 (three) times a week.     multivitamin-lutein (OCUVITE-LUTEIN) CAPS capsule Take 1 capsule by mouth daily.     Omeprazole-Sodium Bicarbonate (ZEGERID) 20-1100 MG CAPS capsule Take 1-2 capsules by mouth at bedtime.     pantoprazole (PROTONIX) 40 MG tablet Take 20 mg by mouth daily.     polyethylene glycol (MIRALAX / GLYCOLAX) 17 g packet Take 17 g by mouth daily as needed for moderate constipation.     Probiotic Product (ALIGN) 4 MG CAPS Take 4 mg by mouth 3 (three) times a week.     sildenafil (VIAGRA) 50 MG tablet Take 50 mg by mouth daily as needed for erectile dysfunction.     Sodium Oxybate 500 MG/ML SOLN Take 2 grams at first dose and 2-3 hours later take 3 grams. 120 mL 5   SUMAtriptan (IMITREX) 100 MG tablet TAKE 1 TABLET BY MOUTH EVERY 2 HOURS AS NEEDED FOR MIGRAINE. MAY REPEAT IN 2 HOURS IF HEADACHE PERSISTS OR RECURS 9 tablet 4   valACYclovir (VALTREX) 500 MG tablet Take 500 mg  by mouth every Monday, Wednesday, and Friday. Mon, Wed Fri     VITAMIN D PO Take 1 tablet by mouth daily.     zaleplon (SONATA) 10 MG capsule Take 10 mg by mouth as needed (when flying overseas).     No current facility-administered medications on file prior to visit.    Allergies  Allergen Reactions   Azithromycin Photosensitivity   Tetracyclines & Related     photosensitivity - can take medication but just must avoid being out in the sun    Doxycycline Photosensitivity   Dulcolax Stool Softener [Dss] Nausea And Vomiting   Oxycodone Rash     DIAGNOSTIC DATA  (LABS, IMAGING, TESTING) - I reviewed patient records, labs, notes, testing and imaging myself where available.  Lab Results  Component Value Date   WBC 5.5 06/02/2023   HGB 14.6 06/02/2023   HCT 41.5 06/02/2023   MCV 89.2 06/02/2023   PLT 222 06/02/2023      Component Value Date/Time   NA 134 (L) 06/02/2023 1441   NA 139 12/30/2022 1043   K 4.1 06/02/2023 1441   CL 100 06/02/2023 1441   CO2 26 06/02/2023 1441   GLUCOSE 103 (H) 06/02/2023 1441   BUN 17 06/02/2023 1441   BUN 16 12/30/2022 1043   CREATININE 1.26 (H) 06/02/2023 1441   CALCIUM 9.6 06/02/2023 1441   PROT 6.9 12/30/2022 1043   ALBUMIN 4.5 12/30/2022 1043   AST 28 12/30/2022 1043   ALT 25 12/30/2022 1043   ALKPHOS 56 12/30/2022 1043   BILITOT 0.5 12/30/2022 1043   GFRNONAA 60 (L) 06/02/2023 1441   GFRAA 57 (L) 06/05/2020 1511   Lab Results  Component Value Date   CHOL 193 11/11/2022   HDL 52 11/11/2022   LDLCALC 121 (H) 11/11/2022   TRIG 98 11/11/2022   CHOLHDL 3.7 11/11/2022   No results found for: "HGBA1C" No results found for: "VITAMINB12" No results found for: "TSH"  PHYSICAL EXAM:  Today's Vitals   07/07/23 1014  BP: 127/81  Pulse: 62  Weight: 167 lb (75.8 kg)  Height: 5\' 7"  (1.702 m)   Body mass index is 26.16 kg/m.   Wt Readings from Last 3 Encounters:  07/07/23 167 lb (75.8 kg)  06/24/23 162 lb (73.5 kg)  06/02/23 163 lb 12.8 oz (74.3 kg)     Ht Readings from Last 3 Encounters:  07/07/23 5\' 7"  (1.702 m)  06/24/23 5\' 8"  (1.727 m)  04/13/23 5\' 7"  (1.702 m)      General: Well developed, in no acute distress  Head: normocephalic and atraumatic,. Oropharynx benign  Neck: Supple,   Musculoskeletal: No deformity  Skin no rash or edema Neurological examination    Mentation: Alert oriented to time, place, history taking. Attention span and concentration appropriate. Recent and remote memory intact.  Follows all commands speech and language fluent.    Cranial nerve : No change in  taste and smell, Pupils were equal round reactive to light extraocular movements were full, visual field were full on confrontational test. Facial sensation and strength were normal. hearing was intact to finger rubbing bilaterally. Uvula tongue midline. head turning and shoulder shrug were normal and symmetric.Tongue protrusion into cheek strength was normal. Motor: normal bulk and tone, full strength in the BUE, BLE,  Sensory: normal and symmetric to light touch,  Coordination: finger-nose-finger, heel-to-shin bilaterally, no dysmetria Gait and Station: Rising up from seated position without assistance, normal stance,  moderate stride, good arm swing, smooth turning, able to  perform tiptoe, and heel walking without difficulty. Tandem gait is steady.    ASSESSMENT AND PLAN 77 y.o. year old male  here with:    1) Narcolepsy well controlled on XYREM , generic. Epworth sleepiness scale remains in the single digits !   2) Migraine - will shift from triptan to NURTEC.  3) HST by AASM criteria AHI 12.7/h  REM AHI was 29 not a patient with apnea - but hypopnea, mild. CMS scored , his AHI is 7.2/h. he thinks its xyrem induced, and doesn't want to use CPAP.   4) new onset mitral valve deterioration, can change exercise tolerance , can cause pulmonary HTN. This is a another concern about triptan.   Refilled XYREM,  started Nurtec and d/c Triptan, no refill for Sonata needed.    I plan to follow up either personally in 12 months.   I would like to thank Christopher Polka., Md [address] [place],  [place] [address] for allowing me to meet with and to take care of this pleasant patient.   After spending a total time of  35  minutes face to face and additional time for physical and neurologic examination, review of laboratory studies,  personal review of imaging studies, reports and results of other testing and review of referral information / records as far as provided in visit,   Electronically  signed by: Christopher Novas, MD 07/07/2023 10:49 AM  [name] Board certified by The [employer] of Sleep Medicine and Diplomate of the [employer] of Sleep Medicine. Board certified In Neurology through the ABPN, Fellow of the [employer] of [employer].

## 2023-07-20 NOTE — H&P (View-Only) (Signed)
 [address]       [place] [address]             [phone]           NIJEE HEATWOLE [facility] Medical Record #[id number] Date of Birth: 28-Aug-1946  Laurey Morale, MD Cleatis Polka., MD  Chief Complaint:  afib and mitral regurgitation   History of Present Illness:     Pt is a very active 77 yo who first noted atrial fibrillation 3 years ago and has been paroxysmal over this time with hardly any past several months. He has seen EP and was to have ablation in January. However pt also was noted over this time to have Mitral valve prolapse and this has progressed over this time to severe regurgitation with primarily P3 prolapse/flail. He has remained active and did several golf courses in [place] this past summer and had no issues walking the courses. He denies CP or lightheadedness or lower ext edema. Recent TEE has confirmed that he has normal LV function and severe MR.      Past Medical History:  Diagnosis Date   Achilles rupture, left    Actinic keratoses    Arthritis    Back pain    low   CHF (congestive heart failure) (HCC)    Colon polyp 2015   Controlled narcolepsy 08/29/2014   Diverticulitis    Dyspnea    on exertion   Fever blister    episodically   Generalized headaches    GERD (gastroesophageal reflux disease)    Hepatitis A 1985   Hypersomnia    Knee injuries 2002   left   Migraine    Migraine headache with aura 08/29/2014   Narcolepsy without cataplexy 04/12/2013    MSLT with  MSL 4.4 minutes.    Pain    facial trigemonal   Rotator cuff (capsule) sprain 08/29/2014   Seborrhea    chronic   Sinusitis    Tendinitis    left middle finger    Past Surgical History:  Procedure Laterality Date   COLONOSCOPY  2011-last   every 5 years   FINGER SURGERY  2009/2010   trigger finger of left hand     HERNIA REPAIR  1963   right inguinal    LASIK  1998 or 99   TEE WITHOUT CARDIOVERSION N/A 06/24/2023   Procedure:  TRANSESOPHAGEAL ECHOCARDIOGRAM;  Surgeon: Laurey Morale, MD;  [facility] INVASIVE CV LAB;  Service: Cardiovascular;  Laterality: N/A;   TONSILLECTOMY AND ADENOIDECTOMY  1960   TRIGGER FINGER RELEASE  08/31/2012   Procedure: MINOR RELEASE TRIGGER FINGER/A-1 PULLEY;  Surgeon: Wyn Forster., MD;  Location: [facility];  Service: Orthopedics;  Laterality: Right;  right long finger   VASECTOMY      Social History   Tobacco Use  Smoking Status Never  Smokeless Tobacco Never    Social History   Substance and Sexual Activity  Alcohol Use Yes   Alcohol/week: 7.0 standard drinks of alcohol   Types: 7 Cans of beer per week   Comment: consumes one beer daily    Social History   Socioeconomic History   Marital status: Married    Spouse name: GraceAnn   Number of children: 2   Years of education: College   Highest education level: Not on file  Occupational History    Employer: [employer]  Tobacco Use   Smoking status: Never  Smokeless tobacco: Never  Vaping Use   Vaping status: Never Used  Substance and Sexual Activity   Alcohol use: Yes    Alcohol/week: 7.0 standard drinks of alcohol    Types: 7 Cans of beer per week    Comment: consumes one beer daily   Drug use: No   Sexual activity: Not on file  Other Topics Concern   Not on file  Social History Narrative   Patient is married ([place]) and lives at home with his wife.   Patient has two adult children.   Patient is working full-time.   Patient has a Naval architect, Photographer.   Patient is right-handed.   Patient drinks two cups of coffee in the morning.   Social Determinants of Health   Financial Resource Strain: Not on file  Food Insecurity: Not on file  Transportation Needs: Not on file  Physical Activity: Not on file  Stress: Not on file  Social Connections: Not on file  Intimate Partner Violence: Not on file    Allergies  Allergen Reactions   Azithromycin  Photosensitivity   Tetracyclines & Related     photosensitivity - can take medication but just must avoid being out in the sun    Doxycycline Photosensitivity   Dulcolax Stool Softener [Dss] Nausea And Vomiting   Oxycodone Rash    Current Outpatient Medications  Medication Sig Dispense Refill   acetaminophen (TYLENOL) 325 MG tablet Take 325 mg by mouth every 6 (six) hours as needed for moderate pain.     CALCIUM-MAGNESIUM-ZINC PO Take 1 tablet by mouth 3 (three) times a week.     ELIQUIS 5 MG TABS tablet TAKE 1 TABLET(5 MG) BY MOUTH TWICE DAILY 180 tablet 3   metoprolol tartrate (LOPRESSOR) 25 MG tablet Take 1 tablet (25 mg total) by mouth every 8 (eight) hours as needed. For a fib 30 tablet 5   Multiple Vitamin (MULTIVITAMIN) capsule Take 1 capsule by mouth 3 (three) times a week.     multivitamin-lutein (OCUVITE-LUTEIN) CAPS capsule Take 1 capsule by mouth daily.     Omeprazole-Sodium Bicarbonate (ZEGERID) 20-1100 MG CAPS capsule Take 1-2 capsules by mouth at bedtime.     pantoprazole (PROTONIX) 40 MG tablet Take 20 mg by mouth daily.     polyethylene glycol (MIRALAX / GLYCOLAX) 17 g packet Take 17 g by mouth daily as needed for moderate constipation.     Probiotic Product (ALIGN) 4 MG CAPS Take 4 mg by mouth 3 (three) times a week.     Rimegepant Sulfate (NURTEC) 75 MG TBDP Take prn for migraine po.  Do not exceed 2 a day. 30 tablet 1   sildenafil (VIAGRA) 50 MG tablet Take 50 mg by mouth daily as needed for erectile dysfunction.     Sodium Oxybate 500 MG/ML SOLN Take 2 grams at first dose and 2-3 hours later take 3 grams. 120 mL 5   valACYclovir (VALTREX) 500 MG tablet Take 500 mg by mouth every Monday, Wednesday, and Friday. Mon, Wed Fri     VITAMIN D PO Take 1 tablet by mouth daily.     zaleplon (SONATA) 10 MG capsule Take 10 mg by mouth as needed (when flying overseas).     No current facility-administered medications for this visit.     Family History  Problem Relation Age of  Onset   Heart disease Father 55   Hypertension Father    Prostate cancer Father    Cancer Mother  breast,colon,skin   Heart disease Mother    CVA Maternal Grandmother    Microcephaly Maternal Grandmother    Microcephaly Paternal Grandfather        Physical Exam: Teeth in good repair Lungs: clear Card: RR with 3/6 sem to axillae Ext: no edema Neuro: intact     Diagnostic Studies & Laboratory data: I have personally reviewed the following studies and agree with the findings   TEE (06/2023) IMPRESSIONS     1. Left ventricular ejection fraction, by estimation, is 55 to 60%. The  left ventricle has normal function. The left ventricle has no regional  wall motion abnormalities.   2. Right ventricular systolic function is normal. The right ventricular  size is normal.   3. Left atrial size was mildly dilated. No left atrial/left atrial  appendage thrombus was detected.   4. There was a small PFO noted by bubble study.   5. Severe mitral regurgitation with a partial flail P1 scallop and a  highly eccentric jet of MR starting laterally and heading medially. PISA  ERO 0.44 cm^2, vena contracta area 0.36 cm^2. No pulmonary vein systolic  flow reversal but jet is highly  eccentric. No evidence of mitral stenosis.   6. The aortic valve is tricuspid. Aortic valve regurgitation is trivial.  No aortic stenosis is present.   FINDINGS   Left Ventricle: Left ventricular ejection fraction, by estimation, is 55  to 60%. The left ventricle has normal function. The left ventricle has no  regional wall motion abnormalities. The left ventricular internal cavity  size was normal in size.   Right Ventricle: The right ventricular size is normal. No increase in  right ventricular wall thickness. Right ventricular systolic function is  normal.   Left Atrium: Left atrial size was mildly dilated. No left atrial/left  atrial appendage thrombus was detected.   Right Atrium: Right  atrial size was normal in size.   Pericardium: There is no evidence of pericardial effusion.   Mitral Valve: Severe mitral regurgitation with a partial flail P1 leaflet  and a highly eccentric jet of MR heading laterally to medially. The mitral  valve is abnormal. Severe mitral valve regurgitation. No evidence of  mitral valve stenosis.   Tricuspid Valve: The tricuspid valve is normal in structure. Tricuspid  valve regurgitation is not demonstrated.   Aortic Valve: The aortic valve is tricuspid. Aortic valve regurgitation is  trivial. No aortic stenosis is present.   Pulmonic Valve: The pulmonic valve was normal in structure. Pulmonic valve  regurgitation is trivial.   Aorta: The aortic root is normal in size and structure.   IAS/Shunts: There was a small PFO noted by bubble study.   Additional Comments: Spectral Doppler performed.   MR Peak grad:    111.9 mmHg  MR Mean grad:    58.0 mmHg  MR Vmax:         529.00 cm/s  MR Vmean:        356.0 cm/s  MR PISA:         6.28 cm  MR PISA Eff ROA: 44 mm  MR PISA Radius:  1.00 cm    Recent Radiology Findings:       Recent Lab Findings: Lab Results  Component Value Date   WBC 5.5 06/02/2023   HGB 14.6 06/02/2023   HCT 41.5 06/02/2023   PLT 222 06/02/2023   GLUCOSE 103 (H) 06/02/2023   CHOL 193 11/11/2022   TRIG 98 11/11/2022   HDL 52 11/11/2022  LDLCALC 121 (H) 11/11/2022   ALT 25 12/30/2022   AST 28 12/30/2022   NA 134 (L) 06/02/2023   K 4.1 06/02/2023   CL 100 06/02/2023   CREATININE 1.26 (H) 06/02/2023   BUN 17 06/02/2023   CO2 26 06/02/2023      Assessment / Plan:     77 yo male with NYHA class 1 symptoms of severe structural MR from posterior leaflet (P1) prolapse/flail with normal LV function complicated with paroxysmal afib.  I believe he is a very good candidate for repair however slightly more challenging due to commissural nature of prolapse. With his age and complexity of repair and need to perform  MAZE procedure with occlusion of LA appendage, I favor sternotomy approach and pt and wife understand and wish to proceed. He will need to have cardiac cath with right heart pressure measurements and routine preoperative studies. He will need to be off eilquis for 5 days without need for bridging prior to surgery. We discussed the risks, goals and recovery from surgery and that if repair not possible that a tissue valve will be used.    I have spent 60 min in review of the records, viewing studies and in face to face with patient and in coordination of future care    Eugenio Hoes 07/20/2023 5:22 PM

## 2023-07-20 NOTE — Progress Notes (Unsigned)
[address]       [place] [address]             [phone]           Christopher Weber [facility] Medical Record #[id number] Date of Birth: 28-Aug-1946  Christopher Morale, MD Christopher Polka., MD  Chief Complaint:  afib and mitral regurgitation   History of Present Illness:     Pt is a very active 77 yo who first noted atrial fibrillation 3 years ago and has been paroxysmal over this time with hardly any past several months. He has seen EP and was to have ablation in January. However pt also was noted over this time to have Mitral valve prolapse and this has progressed over this time to severe regurgitation with primarily P3 prolapse/flail. He has remained active and did several golf courses in [place] this past summer and had no issues walking the courses. He denies CP or lightheadedness or lower ext edema. Recent TEE has confirmed that he has normal LV function and severe MR.      Past Medical History:  Diagnosis Date   Achilles rupture, left    Actinic keratoses    Arthritis    Back pain    low   CHF (congestive heart failure) (HCC)    Colon polyp 2015   Controlled narcolepsy 08/29/2014   Diverticulitis    Dyspnea    on exertion   Fever blister    episodically   Generalized headaches    GERD (gastroesophageal reflux disease)    Hepatitis A 1985   Hypersomnia    Knee injuries 2002   left   Migraine    Migraine headache with aura 08/29/2014   Narcolepsy without cataplexy 04/12/2013    MSLT with  MSL 4.4 minutes.    Pain    facial trigemonal   Rotator cuff (capsule) sprain 08/29/2014   Seborrhea    chronic   Sinusitis    Tendinitis    left middle finger    Past Surgical History:  Procedure Laterality Date   COLONOSCOPY  2011-last   every 5 years   FINGER SURGERY  2009/2010   trigger finger of left hand     HERNIA REPAIR  1963   right inguinal    LASIK  1998 or 99   TEE WITHOUT CARDIOVERSION N/A 06/24/2023   Procedure:  TRANSESOPHAGEAL ECHOCARDIOGRAM;  Surgeon: Christopher Morale, MD;  Location: [facility] INVASIVE CV LAB;  Service: Cardiovascular;  Laterality: N/A;   TONSILLECTOMY AND ADENOIDECTOMY  1960   TRIGGER FINGER RELEASE  08/31/2012   Procedure: MINOR RELEASE TRIGGER FINGER/A-1 PULLEY;  Surgeon: Wyn Forster., MD;  Location: [facility];  Service: Orthopedics;  Laterality: Right;  right long finger   VASECTOMY      Social History   Tobacco Use  Smoking Status Never  Smokeless Tobacco Never    Social History   Substance and Sexual Activity  Alcohol Use Yes   Alcohol/week: 7.0 standard drinks of alcohol   Types: 7 Cans of beer per week   Comment: consumes one beer daily    Social History   Socioeconomic History   Marital status: Married    Spouse name: GraceAnn   Number of children: 2   Years of education: College   Highest education level: Not on file  Occupational History    Employer: [employer]  Tobacco Use   Smoking status: Never  Smokeless tobacco: Never  Vaping Use   Vaping status: Never Used  Substance and Sexual Activity   Alcohol use: Yes    Alcohol/week: 7.0 standard drinks of alcohol    Types: 7 Cans of beer per week    Comment: consumes one beer daily   Drug use: No   Sexual activity: Not on file  Other Topics Concern   Not on file  Social History Narrative   Patient is married ([place]) and lives at home with his wife.   Patient has two adult children.   Patient is working full-time.   Patient has a Naval architect, Photographer.   Patient is right-handed.   Patient drinks two cups of coffee in the morning.   Social Determinants of Health   Financial Resource Strain: Not on file  Food Insecurity: Not on file  Transportation Needs: Not on file  Physical Activity: Not on file  Stress: Not on file  Social Connections: Not on file  Intimate Partner Violence: Not on file    Allergies  Allergen Reactions   Azithromycin  Photosensitivity   Tetracyclines & Related     photosensitivity - can take medication but just must avoid being out in the sun    Doxycycline Photosensitivity   Dulcolax Stool Softener [Dss] Nausea And Vomiting   Oxycodone Rash    Current Outpatient Medications  Medication Sig Dispense Refill   acetaminophen (TYLENOL) 325 MG tablet Take 325 mg by mouth every 6 (six) hours as needed for moderate pain.     CALCIUM-MAGNESIUM-ZINC PO Take 1 tablet by mouth 3 (three) times a week.     ELIQUIS 5 MG TABS tablet TAKE 1 TABLET(5 MG) BY MOUTH TWICE DAILY 180 tablet 3   metoprolol tartrate (LOPRESSOR) 25 MG tablet Take 1 tablet (25 mg total) by mouth every 8 (eight) hours as needed. For a fib 30 tablet 5   Multiple Vitamin (MULTIVITAMIN) capsule Take 1 capsule by mouth 3 (three) times a week.     multivitamin-lutein (OCUVITE-LUTEIN) CAPS capsule Take 1 capsule by mouth daily.     Omeprazole-Sodium Bicarbonate (ZEGERID) 20-1100 MG CAPS capsule Take 1-2 capsules by mouth at bedtime.     pantoprazole (PROTONIX) 40 MG tablet Take 20 mg by mouth daily.     polyethylene glycol (MIRALAX / GLYCOLAX) 17 g packet Take 17 g by mouth daily as needed for moderate constipation.     Probiotic Product (ALIGN) 4 MG CAPS Take 4 mg by mouth 3 (three) times a week.     Rimegepant Sulfate (NURTEC) 75 MG TBDP Take prn for migraine po.  Do not exceed 2 a day. 30 tablet 1   sildenafil (VIAGRA) 50 MG tablet Take 50 mg by mouth daily as needed for erectile dysfunction.     Sodium Oxybate 500 MG/ML SOLN Take 2 grams at first dose and 2-3 hours later take 3 grams. 120 mL 5   valACYclovir (VALTREX) 500 MG tablet Take 500 mg by mouth every Monday, Wednesday, and Friday. Mon, Wed Fri     VITAMIN D PO Take 1 tablet by mouth daily.     zaleplon (SONATA) 10 MG capsule Take 10 mg by mouth as needed (when flying overseas).     No current facility-administered medications for this visit.     Family History  Problem Relation Age of  Onset   Heart disease Father 55   Hypertension Father    Prostate cancer Father    Cancer Mother  breast,colon,skin   Heart disease Mother    CVA Maternal Grandmother    Microcephaly Maternal Grandmother    Microcephaly Paternal Grandfather        Physical Exam: Teeth in good repair Lungs: clear Card: RR with 3/6 sem to axillae Ext: no edema Neuro: intact     Diagnostic Studies & Laboratory data: I have personally reviewed the following studies and agree with the findings   TEE (06/2023) IMPRESSIONS     1. Left ventricular ejection fraction, by estimation, is 55 to 60%. The  left ventricle has normal function. The left ventricle has no regional  wall motion abnormalities.   2. Right ventricular systolic function is normal. The right ventricular  size is normal.   3. Left atrial size was mildly dilated. No left atrial/left atrial  appendage thrombus was detected.   4. There was a small PFO noted by bubble study.   5. Severe mitral regurgitation with a partial flail P1 scallop and a  highly eccentric jet of MR starting laterally and heading medially. PISA  ERO 0.44 cm^2, vena contracta area 0.36 cm^2. No pulmonary vein systolic  flow reversal but jet is highly  eccentric. No evidence of mitral stenosis.   6. The aortic valve is tricuspid. Aortic valve regurgitation is trivial.  No aortic stenosis is present.   FINDINGS   Left Ventricle: Left ventricular ejection fraction, by estimation, is 55  to 60%. The left ventricle has normal function. The left ventricle has no  regional wall motion abnormalities. The left ventricular internal cavity  size was normal in size.   Right Ventricle: The right ventricular size is normal. No increase in  right ventricular wall thickness. Right ventricular systolic function is  normal.   Left Atrium: Left atrial size was mildly dilated. No left atrial/left  atrial appendage thrombus was detected.   Right Atrium: Right  atrial size was normal in size.   Pericardium: There is no evidence of pericardial effusion.   Mitral Valve: Severe mitral regurgitation with a partial flail P1 leaflet  and a highly eccentric jet of MR heading laterally to medially. The mitral  valve is abnormal. Severe mitral valve regurgitation. No evidence of  mitral valve stenosis.   Tricuspid Valve: The tricuspid valve is normal in structure. Tricuspid  valve regurgitation is not demonstrated.   Aortic Valve: The aortic valve is tricuspid. Aortic valve regurgitation is  trivial. No aortic stenosis is present.   Pulmonic Valve: The pulmonic valve was normal in structure. Pulmonic valve  regurgitation is trivial.   Aorta: The aortic root is normal in size and structure.   IAS/Shunts: There was a small PFO noted by bubble study.   Additional Comments: Spectral Doppler performed.   MR Peak grad:    111.9 mmHg  MR Mean grad:    58.0 mmHg  MR Vmax:         529.00 cm/s  MR Vmean:        356.0 cm/s  MR PISA:         6.28 cm  MR PISA Eff ROA: 44 mm  MR PISA Radius:  1.00 cm    Recent Radiology Findings:       Recent Lab Findings: Lab Results  Component Value Date   WBC 5.5 06/02/2023   HGB 14.6 06/02/2023   HCT 41.5 06/02/2023   PLT 222 06/02/2023   GLUCOSE 103 (H) 06/02/2023   CHOL 193 11/11/2022   TRIG 98 11/11/2022   HDL 52 11/11/2022  LDLCALC 121 (H) 11/11/2022   ALT 25 12/30/2022   AST 28 12/30/2022   NA 134 (L) 06/02/2023   K 4.1 06/02/2023   CL 100 06/02/2023   CREATININE 1.26 (H) 06/02/2023   BUN 17 06/02/2023   CO2 26 06/02/2023      Assessment / Plan:     77 yo male with NYHA class 1 symptoms of severe structural MR from posterior leaflet (P1) prolapse/flail with normal LV function complicated with paroxysmal afib.  I believe he is a very good candidate for repair however slightly more challenging due to commissural nature of prolapse. With his age and complexity of repair and need to perform  MAZE procedure with occlusion of LA appendage, I favor sternotomy approach and pt and wife understand and wish to proceed. He will need to have cardiac cath with right heart pressure measurements and routine preoperative studies. He will need to be off eilquis for 5 days without need for bridging prior to surgery. We discussed the risks, goals and recovery from surgery and that if repair not possible that a tissue valve will be used.    I have spent 60 min in review of the records, viewing studies and in face to face with patient and in coordination of future care    Christopher Weber 07/20/2023 5:22 PM

## 2023-07-21 ENCOUNTER — Institutional Professional Consult (permissible substitution): Payer: Medicare Other | Admitting: Thoracic Surgery (Cardiothoracic Vascular Surgery)

## 2023-07-21 ENCOUNTER — Telehealth: Payer: Self-pay | Admitting: Cardiology

## 2023-07-21 ENCOUNTER — Other Ambulatory Visit: Payer: Self-pay | Admitting: Thoracic Surgery (Cardiothoracic Vascular Surgery)

## 2023-07-21 ENCOUNTER — Encounter: Payer: Self-pay | Admitting: Thoracic Surgery (Cardiothoracic Vascular Surgery)

## 2023-07-21 VITALS — BP 125/76 | HR 65 | Resp 20 | Ht 67.0 in | Wt 166.2 lb

## 2023-07-21 DIAGNOSIS — I340 Nonrheumatic mitral (valve) insufficiency: Secondary | ICD-10-CM | POA: Diagnosis not present

## 2023-07-21 DIAGNOSIS — I429 Cardiomyopathy, unspecified: Secondary | ICD-10-CM

## 2023-07-21 NOTE — Patient Instructions (Signed)
Right and left heart cath CT of chest MV repair, MAZE 12/16

## 2023-07-21 NOTE — Telephone Encounter (Signed)
Patient is calling to cancel his ATRIAL FIBRILLATION ABLATION on 09/29/23

## 2023-07-22 ENCOUNTER — Other Ambulatory Visit: Payer: Self-pay | Admitting: *Deleted

## 2023-07-22 ENCOUNTER — Encounter: Payer: Self-pay | Admitting: *Deleted

## 2023-07-22 ENCOUNTER — Other Ambulatory Visit (HOSPITAL_COMMUNITY): Payer: Self-pay | Admitting: *Deleted

## 2023-07-22 ENCOUNTER — Telehealth (HOSPITAL_COMMUNITY): Payer: Self-pay | Admitting: *Deleted

## 2023-07-22 DIAGNOSIS — I340 Nonrheumatic mitral (valve) insufficiency: Secondary | ICD-10-CM

## 2023-07-22 NOTE — Telephone Encounter (Signed)
Per Dr Leafy Ro and Dr Shirlee Latch pt needs R/L [facility] prior to MVR on 12/16, will need to hold Eliquis day prior and day of  R/L sch for Fri 12/6 at 7:30 AM, pt is aware and agreeable, all instructions reviewed with pt.

## 2023-07-25 NOTE — Telephone Encounter (Addendum)
Spoke with the patient who states that he is having a MVR and Maze procedure in December so he is cancelling his afib ablation.

## 2023-07-28 ENCOUNTER — Telehealth: Payer: Self-pay

## 2023-07-28 NOTE — Telephone Encounter (Signed)
*  [facility]  Pharmacy Patient Advocate Encounter   Received notification from CoverMyMeds that prior authorization for Nurtec 75MG  dispersible tablets  is required/requested.   Insurance verification completed.   The patient is insured through [facility] .   Per test claim: PA required; PA started via CoverMyMeds. KEY [id number] . Waiting for clinical questions to populate.

## 2023-07-29 NOTE — Telephone Encounter (Signed)
Questions populated and submitted

## 2023-08-05 DIAGNOSIS — Z23 Encounter for immunization: Secondary | ICD-10-CM | POA: Diagnosis not present

## 2023-08-05 NOTE — Telephone Encounter (Signed)
Pharmacy Patient Advocate Encounter  Received notification from [facility] that Prior Authorization for Nurtec 75mg  has been APPROVED from 07/29/2023 to 07/28/2024

## 2023-08-17 ENCOUNTER — Ambulatory Visit (HOSPITAL_COMMUNITY)
Admission: RE | Admit: 2023-08-17 | Discharge: 2023-08-17 | Disposition: A | Discharge status: Home / Self Care | Payer: Medicare Other | Source: Ambulatory Visit | Attending: Cardiology | Admitting: Cardiology

## 2023-08-17 DIAGNOSIS — I340 Nonrheumatic mitral (valve) insufficiency: Secondary | ICD-10-CM | POA: Diagnosis not present

## 2023-08-17 LAB — CBC
HCT: 45.7 % (ref 39.0–52.0)
Hemoglobin: 15.5 g/dL (ref 13.0–17.0)
MCH: 30.1 pg (ref 26.0–34.0)
MCHC: 33.9 g/dL (ref 30.0–36.0)
MCV: 88.7 fL (ref 80.0–100.0)
Platelets: 236 10*3/uL (ref 150–400)
RBC: 5.15 MIL/uL (ref 4.22–5.81)
RDW: 13.2 % (ref 11.5–15.5)
WBC: 5.4 10*3/uL (ref 4.0–10.5)
nRBC: 0 % (ref 0.0–0.2)

## 2023-08-17 LAB — BASIC METABOLIC PANEL
Anion gap: 8 (ref 5–15)
BUN: 16 mg/dL (ref 8–23)
CO2: 26 mmol/L (ref 22–32)
Calcium: 9.9 mg/dL (ref 8.9–10.3)
Chloride: 103 mmol/L (ref 98–111)
Creatinine, Ser: 1.38 mg/dL — ABNORMAL HIGH (ref 0.61–1.24)
GFR, Estimated: 53 mL/min — ABNORMAL LOW (ref >60)
Glucose, Bld: 101 mg/dL — ABNORMAL HIGH (ref 70–99)
Potassium: 4.7 mmol/L (ref 3.5–5.1)
Sodium: 137 mmol/L (ref 135–145)

## 2023-08-19 ENCOUNTER — Ambulatory Visit (HOSPITAL_COMMUNITY)
Admission: RE | Admit: 2023-08-19 | Discharge: 2023-08-19 | Disposition: A | Discharge status: Home / Self Care | Payer: Medicare Other | Attending: Cardiology | Admitting: Cardiology

## 2023-08-19 ENCOUNTER — Encounter (HOSPITAL_COMMUNITY): Payer: Self-pay | Admitting: Cardiology

## 2023-08-19 ENCOUNTER — Encounter (HOSPITAL_COMMUNITY)
Admission: RE | Disposition: A | Discharge status: Home / Self Care | Payer: Self-pay | Source: Home / Self Care | Attending: Cardiology

## 2023-08-19 ENCOUNTER — Other Ambulatory Visit: Payer: Self-pay

## 2023-08-19 DIAGNOSIS — I509 Heart failure, unspecified: Secondary | ICD-10-CM | POA: Diagnosis not present

## 2023-08-19 DIAGNOSIS — Z7901 Long term (current) use of anticoagulants: Secondary | ICD-10-CM | POA: Diagnosis not present

## 2023-08-19 DIAGNOSIS — I480 Paroxysmal atrial fibrillation: Secondary | ICD-10-CM | POA: Diagnosis not present

## 2023-08-19 DIAGNOSIS — I2510 Atherosclerotic heart disease of native coronary artery without angina pectoris: Secondary | ICD-10-CM | POA: Diagnosis not present

## 2023-08-19 DIAGNOSIS — I340 Nonrheumatic mitral (valve) insufficiency: Secondary | ICD-10-CM | POA: Diagnosis not present

## 2023-08-19 HISTORY — PX: RIGHT/LEFT HEART CATH AND CORONARY ANGIOGRAPHY: CATH118266

## 2023-08-19 LAB — POCT I-STAT 7, (LYTES, BLD GAS, ICA,H+H)
Acid-base deficit: 2 mmol/L (ref 0.0–2.0)
Acid-base deficit: 2 mmol/L (ref 0.0–2.0)
Bicarbonate: 22.4 mmol/L (ref 20.0–28.0)
Bicarbonate: 22.5 mmol/L (ref 20.0–28.0)
Calcium, Ion: 1.23 mmol/L (ref 1.15–1.40)
Calcium, Ion: 1.26 mmol/L (ref 1.15–1.40)
HCT: 39 % (ref 39.0–52.0)
HCT: 40 % (ref 39.0–52.0)
Hemoglobin: 13.3 g/dL (ref 13.0–17.0)
Hemoglobin: 13.6 g/dL (ref 13.0–17.0)
O2 Saturation: 70 %
O2 Saturation: 71 %
Potassium: 3.9 mmol/L (ref 3.5–5.1)
Potassium: 4 mmol/L (ref 3.5–5.1)
Sodium: 139 mmol/L (ref 135–145)
Sodium: 139 mmol/L (ref 135–145)
TCO2: 24 mmol/L (ref 22–32)
TCO2: 24 mmol/L (ref 22–32)
pCO2 arterial: 36.6 mm[Hg] (ref 32–48)
pCO2 arterial: 37.1 mm[Hg] (ref 32–48)
pH, Arterial: 7.389 (ref 7.35–7.45)
pH, Arterial: 7.397 (ref 7.35–7.45)
pO2, Arterial: 37 mm[Hg] — CL (ref 83–108)
pO2, Arterial: 37 mm[Hg] — CL (ref 83–108)

## 2023-08-19 SURGERY — RIGHT/LEFT HEART CATH AND CORONARY ANGIOGRAPHY
Anesthesia: LOCAL

## 2023-08-19 MED ORDER — HEPARIN (PORCINE) IN NACL 1000-0.9 UT/500ML-% IV SOLN
INTRAVENOUS | Status: DC | PRN
  Administered 2023-08-19 (×2): 500 mL

## 2023-08-19 MED ORDER — MIDAZOLAM HCL 2 MG/2ML IJ SOLN
INTRAMUSCULAR | Status: AC
  Filled 2023-08-19: qty 2

## 2023-08-19 MED ORDER — LIDOCAINE HCL (PF) 1 % IJ SOLN
INTRAMUSCULAR | Status: DC | PRN
  Administered 2023-08-19 (×2): 5 mL

## 2023-08-19 MED ORDER — VERAPAMIL HCL 2.5 MG/ML IV SOLN
INTRAVENOUS | Status: AC
  Filled 2023-08-19: qty 2

## 2023-08-19 MED ORDER — MIDAZOLAM HCL 2 MG/2ML IJ SOLN
INTRAMUSCULAR | Status: DC | PRN
  Administered 2023-08-19 (×2): 1 mg via INTRAVENOUS

## 2023-08-19 MED ORDER — SODIUM CHLORIDE 0.9 % IV SOLN: 250.0000 mL | INTRAVENOUS | Status: DC | PRN

## 2023-08-19 MED ORDER — APIXABAN 5 MG PO TABS: 5.0000 mg | ORAL_TABLET | Freq: Two times a day (BID) | ORAL | 3 refills | Status: DC

## 2023-08-19 MED ORDER — HEPARIN SODIUM (PORCINE) 1000 UNIT/ML IJ SOLN
INTRAMUSCULAR | Status: AC
  Filled 2023-08-19: qty 10

## 2023-08-19 MED ORDER — FENTANYL CITRATE (PF) 100 MCG/2ML IJ SOLN
INTRAMUSCULAR | Status: AC
  Filled 2023-08-19: qty 2

## 2023-08-19 MED ORDER — SODIUM CHLORIDE 0.9% FLUSH: 10.0000 mL | Freq: Two times a day (BID) | INTRAVENOUS | Status: DC

## 2023-08-19 MED ORDER — ONDANSETRON HCL 4 MG/2ML IJ SOLN: 4.0000 mg | Freq: Four times a day (QID) | INTRAMUSCULAR | Status: DC | PRN

## 2023-08-19 MED ORDER — ASPIRIN 81 MG PO CHEW
81.0000 mg | CHEWABLE_TABLET | ORAL | Status: AC
  Administered 2023-08-19: 81 mg via ORAL
  Filled 2023-08-19: qty 1

## 2023-08-19 MED ORDER — HEPARIN SODIUM (PORCINE) 1000 UNIT/ML IJ SOLN
INTRAMUSCULAR | Status: DC | PRN
  Administered 2023-08-19: 4000 [IU] via INTRAVENOUS

## 2023-08-19 MED ORDER — VERAPAMIL HCL 2.5 MG/ML IV SOLN
INTRAVENOUS | Status: DC | PRN
  Administered 2023-08-19: 10 mL via INTRA_ARTERIAL

## 2023-08-19 MED ORDER — SODIUM CHLORIDE 0.9% FLUSH
3.0000 mL | Freq: Two times a day (BID) | INTRAVENOUS | Status: DC
Start: 2023-08-19 — End: 2023-08-19

## 2023-08-19 MED ORDER — FENTANYL CITRATE (PF) 100 MCG/2ML IJ SOLN
INTRAMUSCULAR | Status: DC | PRN
  Administered 2023-08-19 (×2): 25 ug via INTRAVENOUS

## 2023-08-19 MED ORDER — HYDRALAZINE HCL 20 MG/ML IJ SOLN: 10.0000 mg | INTRAMUSCULAR | Status: DC | PRN

## 2023-08-19 MED ORDER — LABETALOL HCL 5 MG/ML IV SOLN: 10.0000 mg | INTRAVENOUS | Status: DC | PRN

## 2023-08-19 MED ORDER — ACETAMINOPHEN 325 MG PO TABS: 650.0000 mg | ORAL_TABLET | ORAL | Status: DC | PRN

## 2023-08-19 MED ORDER — SODIUM CHLORIDE 0.9% FLUSH: 3.0000 mL | INTRAVENOUS | Status: DC | PRN

## 2023-08-19 MED ORDER — SODIUM CHLORIDE 0.9 % IV SOLN: INTRAVENOUS | Status: DC

## 2023-08-19 MED ORDER — LIDOCAINE HCL (PF) 1 % IJ SOLN
INTRAMUSCULAR | Status: AC
  Filled 2023-08-19: qty 30

## 2023-08-19 MED ORDER — IOHEXOL 350 MG/ML SOLN
INTRAVENOUS | Status: DC | PRN
  Administered 2023-08-19: 40 mL

## 2023-08-19 SURGICAL SUPPLY — 11 items
CATH 5FR JL3.5 JR4 ANG PIG MP (CATHETERS) IMPLANT
CATH BALLN WEDGE 5F 110CM (CATHETERS) IMPLANT
DEVICE RAD COMP TR BAND LRG (VASCULAR PRODUCTS) IMPLANT
GLIDESHEATH SLEND SS 6F .021 (SHEATH) IMPLANT
GUIDEWIRE INQWIRE 1.5J.035X260 (WIRE) IMPLANT
INQWIRE 1.5J .035X260CM (WIRE) ×2
KIT SYRINGE INJ CVI SPIKEX1 (MISCELLANEOUS) IMPLANT
PACK CARDIAC CATHETERIZATION (CUSTOM PROCEDURE TRAY) ×1 IMPLANT
SET ATX-X65L (MISCELLANEOUS) IMPLANT
SHEATH GLIDE SLENDER 4/5FR (SHEATH) IMPLANT
WIRE HI TORQ VERSACORE-J 145CM (WIRE) IMPLANT

## 2023-08-19 NOTE — Discharge Instructions (Signed)
Restart Eliquis tomorrow morning (Saturday).

## 2023-08-19 NOTE — Interval H&P Note (Signed)
History and Physical Interval Note:  08/19/2023 8:04 AM  Christopher Weber  has presented today for surgery, with the diagnosis of heart failure - mr.  The various methods of treatment have been discussed with the patient and family. After consideration of risks, benefits and other options for treatment, the patient has consented to  Procedure(s): RIGHT/LEFT HEART CATH AND CORONARY ANGIOGRAPHY (N/A) as a surgical intervention.  The patient's history has been reviewed, patient examined, no change in status, stable for surgery.  I have reviewed the patient's chart and labs.  Questions were answered to the patient's satisfaction.     Tyce Delcid [employer]

## 2023-08-24 NOTE — Progress Notes (Addendum)
Surgical Instructions   Your procedure is scheduled on December 16, 24. Report to [facility] Main Entrance "A" at 5:30 A.M., then check in with the Admitting office. Any questions or running late day of surgery: call [phone]  Questions prior to your surgery date: call [phone], Monday-Friday, 8am-4pm. If you experience any cold or flu symptoms such as cough, fever, chills, shortness of breath, etc. between now and your scheduled surgery, please notify us at the above number.     Remember:  Do not eat or drink after midnight the night before your surgery     Take these medicines the morning of surgery with A SIP OF WATER   metoprolol tartrate (LOPRESSOR)  Omeprazole-Sodium Bicarbonate (ZEGERID)  pantoprazole (PROTONIX)  polyethylene glycol (MIRALAX / GLYCOLAX)    May take these medicines IF NEEDED:  acetaminophen (TYLENOL)  fluticasone (FLONASE)  Rimegepant Sulfate (NURTEC)   Hold your apixaban (ELIQUIS) for 5 days prior to surgery per your physician's instructions.  Your last dose should be taken on 08-23-23.   One week prior to surgery, STOP taking any Aspirin (unless otherwise instructed by your surgeon) Aleve, Naproxen, Ibuprofen, Motrin, Advil, Goody's, BC's, all herbal medications, fish oil, and non-prescription vitamins.                     Do NOT Smoke (Tobacco/Vaping) for 24 hours prior to your procedure.  If you use a CPAP at night, you may bring your mask/headgear for your overnight stay.   You will be asked to remove any contacts, glasses, piercing's, hearing aid's, dentures/partials prior to surgery. Please bring cases for these items if needed.    Patients discharged the day of surgery will not be allowed to drive home, and someone needs to stay with them for 24 hours.  SURGICAL WAITING ROOM VISITATION Patients may have no more than 2 support people in the waiting area - these visitors may rotate.   Pre-op nurse will coordinate an appropriate time  for 1 ADULT support person, who may not rotate, to accompany patient in pre-op.  Children under the age of 82 must have an adult with them who is not the patient and must remain in the main waiting area with an adult.  If the patient needs to stay at the hospital during part of their recovery, the visitor guidelines for inpatient rooms apply.  Please refer to the [facility] website for the visitor guidelines for any additional information.   If you received a COVID test during your pre-op visit  it is requested that you wear a mask when out in public, stay away from anyone that may not be feeling well and notify your surgeon if you develop symptoms. If you have been in contact with anyone that has tested positive in the last 10 days please notify you surgeon.      Pre-operative CHG Bathing Instructions   You can play a key role in reducing the risk of infection after surgery. Your skin needs to be as free of germs as possible. You can reduce the number of germs on your skin by washing with CHG (chlorhexidine gluconate) soap before surgery. CHG is an antiseptic soap that kills germs and continues to kill germs even after washing.   DO NOT use if you have an allergy to chlorhexidine/CHG or antibacterial soaps. If your skin becomes reddened or irritated, stop using the CHG and notify one of our RNs at [phone].  TAKE A SHOWER THE NIGHT BEFORE SURGERY AND THE DAY OF SURGERY    Please keep in mind the following:  DO NOT shave, including legs and underarms, 48 hours prior to surgery.   You may shave your face before/day of surgery.  Place clean sheets on your bed the night before surgery Use a clean washcloth (not used since being washed) for each shower. DO NOT sleep with pet's night before surgery.  CHG Shower Instructions:  Wash your face and private area with normal soap. If you choose to wash your hair, wash first with your normal shampoo.  After you use shampoo/soap,  rinse your hair and body thoroughly to remove shampoo/soap residue.  Turn the water OFF and apply half the bottle of CHG soap to a CLEAN washcloth.  Apply CHG soap ONLY FROM YOUR NECK DOWN TO YOUR TOES (washing for 3-5 minutes)  DO NOT use CHG soap on face, private areas, open wounds, or sores.  Pay special attention to the area where your surgery is being performed.  If you are having back surgery, having someone wash your back for you may be helpful. Wait 2 minutes after CHG soap is applied, then you may rinse off the CHG soap.  Pat dry with a clean towel  Put on clean pajamas    Additional instructions for the day of surgery: DO NOT APPLY any lotions, deodorants, cologne, or perfumes.   Do not wear jewelry or makeup Do not wear nail polish, gel polish, artificial nails, or any other type of covering on natural nails (fingers and toes) Do not bring valuables to the hospital. [facility] is not responsible for valuables/personal belongings. Put on clean/comfortable clothes.  Please brush your teeth.  Ask your nurse before applying any prescription medications to the skin.

## 2023-08-25 ENCOUNTER — Ambulatory Visit (HOSPITAL_COMMUNITY)
Admission: RE | Admit: 2023-08-25 | Discharge: 2023-08-25 | Disposition: A | Discharge status: Home / Self Care | Payer: Medicare Other | Source: Ambulatory Visit | Attending: Thoracic Surgery (Cardiothoracic Vascular Surgery) | Admitting: Thoracic Surgery (Cardiothoracic Vascular Surgery)

## 2023-08-25 ENCOUNTER — Other Ambulatory Visit: Payer: Self-pay

## 2023-08-25 ENCOUNTER — Encounter (HOSPITAL_COMMUNITY): Payer: Self-pay

## 2023-08-25 ENCOUNTER — Encounter (HOSPITAL_COMMUNITY)
Admission: RE | Admit: 2023-08-25 | Discharge: 2023-08-25 | Disposition: A | Discharge status: Home / Self Care | Payer: Medicare Other | Source: Ambulatory Visit | Attending: Thoracic Surgery (Cardiothoracic Vascular Surgery) | Admitting: Thoracic Surgery (Cardiothoracic Vascular Surgery)

## 2023-08-25 VITALS — BP 121/82 | HR 55 | Temp 98.0°F | Resp 18 | Ht 67.0 in | Wt 166.1 lb

## 2023-08-25 DIAGNOSIS — I340 Nonrheumatic mitral (valve) insufficiency: Secondary | ICD-10-CM

## 2023-08-25 DIAGNOSIS — Z01812 Encounter for preprocedural laboratory examination: Secondary | ICD-10-CM | POA: Diagnosis present

## 2023-08-25 DIAGNOSIS — Z01818 Encounter for other preprocedural examination: Secondary | ICD-10-CM

## 2023-08-25 DIAGNOSIS — I429 Cardiomyopathy, unspecified: Secondary | ICD-10-CM

## 2023-08-25 DIAGNOSIS — N281 Cyst of kidney, acquired: Secondary | ICD-10-CM | POA: Diagnosis not present

## 2023-08-25 DIAGNOSIS — Z79899 Other long term (current) drug therapy: Secondary | ICD-10-CM | POA: Insufficient documentation

## 2023-08-25 DIAGNOSIS — I4891 Unspecified atrial fibrillation: Secondary | ICD-10-CM | POA: Diagnosis not present

## 2023-08-25 DIAGNOSIS — Z01810 Encounter for preprocedural cardiovascular examination: Secondary | ICD-10-CM | POA: Diagnosis present

## 2023-08-25 HISTORY — DX: Nonrheumatic mitral (valve) insufficiency: I34.0

## 2023-08-25 HISTORY — DX: Chronic kidney disease, unspecified: N18.9

## 2023-08-25 HISTORY — DX: Unspecified atrial fibrillation: I48.91

## 2023-08-25 HISTORY — DX: Personal history of other diseases of the digestive system: Z87.19

## 2023-08-25 HISTORY — DX: Cardiac arrhythmia, unspecified: I49.9

## 2023-08-25 LAB — URINALYSIS, ROUTINE W REFLEX MICROSCOPIC
Bilirubin Urine: NEGATIVE
Glucose, UA: NEGATIVE mg/dL
Hgb urine dipstick: NEGATIVE
Ketones, ur: NEGATIVE mg/dL
Leukocytes,Ua: NEGATIVE
Nitrite: NEGATIVE
Protein, ur: NEGATIVE mg/dL
Specific Gravity, Urine: 1.016 (ref 1.005–1.030)
pH: 7 (ref 5.0–8.0)

## 2023-08-25 LAB — COMPREHENSIVE METABOLIC PANEL
ALT: 22 U/L (ref 0–44)
AST: 27 U/L (ref 15–41)
Albumin: 4 g/dL (ref 3.5–5.0)
Alkaline Phosphatase: 38 U/L (ref 38–126)
Anion gap: 8 (ref 5–15)
BUN: 13 mg/dL (ref 8–23)
CO2: 27 mmol/L (ref 22–32)
Calcium: 9.5 mg/dL (ref 8.9–10.3)
Chloride: 102 mmol/L (ref 98–111)
Creatinine, Ser: 1.32 mg/dL — ABNORMAL HIGH (ref 0.61–1.24)
GFR, Estimated: 56 mL/min — ABNORMAL LOW (ref >60)
Glucose, Bld: 98 mg/dL (ref 70–99)
Potassium: 4.1 mmol/L (ref 3.5–5.1)
Sodium: 137 mmol/L (ref 135–145)
Total Bilirubin: 0.8 mg/dL (ref <1.2)
Total Protein: 6.9 g/dL (ref 6.5–8.1)

## 2023-08-25 LAB — TYPE AND SCREEN
ABO/RH(D): A NEG
Antibody Screen: NEGATIVE

## 2023-08-25 LAB — CBC
HCT: 43.5 % (ref 39.0–52.0)
Hemoglobin: 14.9 g/dL (ref 13.0–17.0)
MCH: 30.7 pg (ref 26.0–34.0)
MCHC: 34.3 g/dL (ref 30.0–36.0)
MCV: 89.5 fL (ref 80.0–100.0)
Platelets: 251 10*3/uL (ref 150–400)
RBC: 4.86 MIL/uL (ref 4.22–5.81)
RDW: 13.3 % (ref 11.5–15.5)
WBC: 6 10*3/uL (ref 4.0–10.5)
nRBC: 0 % (ref 0.0–0.2)

## 2023-08-25 LAB — SURGICAL PCR SCREEN
MRSA, PCR: NEGATIVE
Staphylococcus aureus: NEGATIVE

## 2023-08-25 LAB — HEMOGLOBIN A1C
Hgb A1c MFr Bld: 5.3 % (ref 4.8–5.6)
Mean Plasma Glucose: 105.41 mg/dL

## 2023-08-25 LAB — APTT: aPTT: 33 s (ref 24–36)

## 2023-08-25 LAB — PROTIME-INR
INR: 1 (ref 0.8–1.2)
Prothrombin Time: 12.9 s (ref 11.4–15.2)

## 2023-08-25 NOTE — Progress Notes (Signed)
PCP - Dr. Clelia Croft Cardiologist - Dr. Shirlee Latch  PPM/ICD - denies Device Orders - n/a Rep Notified - n/a  Chest x-ray - 08/25/23 EKG - 08/19/23 Stress Test - denies ECHO - 06/24/23 Cardiac Cath - 08/19/23    Fasting Blood Sugar - denies   Blood Thinner Instructions:  last dose of Eliquis was 12/10 Aspirin Instructions: n/a  ERAS Protcol - NPO  COVID TEST- pending   Anesthesia review: yes  Patient denies shortness of breath, fever, cough and chest pain at PAT appointment   All instructions explained to the patient, with a verbal understanding of the material. Patient agrees to go over the instructions while at home for a better understanding. Patient also instructed to self quarantine after being tested for COVID-19. The opportunity to ask questions was provided.

## 2023-08-26 ENCOUNTER — Encounter (HOSPITAL_COMMUNITY): Payer: Self-pay

## 2023-08-26 LAB — SARS CORONAVIRUS 2 (TAT 6-24 HRS): SARS Coronavirus 2: NEGATIVE

## 2023-08-26 MED ORDER — PHENYLEPHRINE HCL-NACL 20-0.9 MG/250ML-% IV SOLN
30.0000 ug/min | INTRAVENOUS | Status: AC
  Administered 2023-08-29: 25 ug/min via INTRAVENOUS
  Filled 2023-08-26: qty 250

## 2023-08-26 MED ORDER — VANCOMYCIN HCL 1250 MG/250ML IV SOLN
1250.0000 mg | INTRAVENOUS | Status: AC
  Administered 2023-08-29: 1250 mg via INTRAVENOUS
  Filled 2023-08-26: qty 250

## 2023-08-26 MED ORDER — INSULIN REGULAR(HUMAN) IN NACL 100-0.9 UT/100ML-% IV SOLN
INTRAVENOUS | Status: AC
  Administered 2023-08-29: .8 [IU]/h via INTRAVENOUS
  Filled 2023-08-26: qty 100

## 2023-08-26 MED ORDER — TRANEXAMIC ACID (OHS) PUMP PRIME SOLUTION
2.0000 mg/kg | INTRAVENOUS | Status: DC
  Filled 2023-08-26: qty 1.51

## 2023-08-26 MED ORDER — TRANEXAMIC ACID 1000 MG/10ML IV SOLN
1.5000 mg/kg/h | INTRAVENOUS | Status: AC
  Administered 2023-08-29: 1.5 mg/kg/h via INTRAVENOUS
  Filled 2023-08-26: qty 25

## 2023-08-26 MED ORDER — MANNITOL 20 % IV SOLN
INTRAVENOUS | Status: DC
  Filled 2023-08-26 (×2): qty 13

## 2023-08-26 MED ORDER — CEFAZOLIN SODIUM-DEXTROSE 2-4 GM/100ML-% IV SOLN
2.0000 g | INTRAVENOUS | Status: DC
  Filled 2023-08-26: qty 100

## 2023-08-26 MED ORDER — EPINEPHRINE HCL 5 MG/250ML IV SOLN IN NS
0.0000 ug/min | INTRAVENOUS | Status: DC
  Filled 2023-08-26: qty 250

## 2023-08-26 MED ORDER — CEFAZOLIN SODIUM-DEXTROSE 2-4 GM/100ML-% IV SOLN
2.0000 g | INTRAVENOUS | Status: AC
  Administered 2023-08-29 (×2): 2 g via INTRAVENOUS
  Filled 2023-08-26: qty 100

## 2023-08-26 MED ORDER — NOREPINEPHRINE 4 MG/250ML-% IV SOLN
0.0000 ug/min | INTRAVENOUS | Status: DC
  Filled 2023-08-26: qty 250

## 2023-08-26 MED ORDER — PLASMA-LYTE A IV SOLN
INTRAVENOUS | Status: DC
  Filled 2023-08-26: qty 2.5

## 2023-08-26 MED ORDER — TRANEXAMIC ACID (OHS) BOLUS VIA INFUSION
15.0000 mg/kg | INTRAVENOUS | Status: AC
  Administered 2023-08-29: 1129.5 mg via INTRAVENOUS
  Filled 2023-08-26: qty 1130

## 2023-08-26 MED ORDER — NITROGLYCERIN IN D5W 200-5 MCG/ML-% IV SOLN
2.0000 ug/min | INTRAVENOUS | Status: DC
  Filled 2023-08-26: qty 250

## 2023-08-26 MED ORDER — MILRINONE LACTATE IN DEXTROSE 20-5 MG/100ML-% IV SOLN
0.3000 ug/kg/min | INTRAVENOUS | Status: DC
  Filled 2023-08-26: qty 100

## 2023-08-26 MED ORDER — POTASSIUM CHLORIDE 2 MEQ/ML IV SOLN
80.0000 meq | INTRAVENOUS | Status: DC
  Filled 2023-08-26: qty 40

## 2023-08-26 MED ORDER — MANNITOL 20 % IV SOLN
INTRAVENOUS | Status: DC
  Filled 2023-08-26: qty 13

## 2023-08-26 MED ORDER — VANCOMYCIN HCL 1000 MG IV SOLR
INTRAVENOUS | Status: DC
  Filled 2023-08-26: qty 20

## 2023-08-26 MED ORDER — HEPARIN 30,000 UNITS/1000 ML (OHS) CELLSAVER SOLUTION
Status: DC
  Filled 2023-08-26: qty 1000

## 2023-08-26 MED ORDER — DEXMEDETOMIDINE HCL IN NACL 400 MCG/100ML IV SOLN
0.1000 ug/kg/h | INTRAVENOUS | Status: AC
  Administered 2023-08-29: .4 ug/kg/h via INTRAVENOUS
  Filled 2023-08-26: qty 100

## 2023-08-28 NOTE — H&P (Signed)
[address]       [place] [address]             [phone]                                   Christopher Weber [facility] Medical Record #[id number] Date of Birth: Apr 23, 1946   Christopher Morale, MD Cleatis Polka., MD   Chief Complaint:  afib and mitral regurgitation    History of Present Illness:     Pt is a very active 77 yo who first noted atrial fibrillation 3 years ago and has been paroxysmal over this time with hardly any past several months. He has seen EP and was to have ablation in January. However pt also was noted over this time to have Mitral valve prolapse and this has progressed over this time to severe regurgitation with primarily P3 prolapse/flail. He has remained active and did several golf courses in [place] this past summer and had no issues walking the courses. He denies CP or lightheadedness or lower ext edema. Recent TEE has confirmed that he has normal LV function and severe MR.             Past Medical History:  Diagnosis Date   Achilles rupture, left     Actinic keratoses     Arthritis     Back pain      low   CHF (congestive heart failure) (HCC)     Colon polyp 2015   Controlled narcolepsy 08/29/2014   Diverticulitis     Dyspnea      on exertion   Fever blister      episodically   Generalized headaches     GERD (gastroesophageal reflux disease)     Hepatitis A 1985   Hypersomnia     Knee injuries 2002    left   Migraine     Migraine headache with aura 08/29/2014   Narcolepsy without cataplexy 04/12/2013     MSLT with  MSL 4.4 minutes.    Pain      facial trigemonal   Rotator cuff (capsule) sprain 08/29/2014   Seborrhea      chronic   Sinusitis     Tendinitis      left middle finger               Past Surgical History:  Procedure Laterality Date   COLONOSCOPY   2011-last    every 5 years   FINGER SURGERY   2009/2010    trigger finger of left hand     HERNIA REPAIR   1963    right inguinal     LASIK   1998 or 99   TEE WITHOUT CARDIOVERSION N/A 06/24/2023    Procedure: TRANSESOPHAGEAL ECHOCARDIOGRAM;  Surgeon: Christopher Morale, MD;  Location: [facility];  Service: Cardiovascular;  Laterality: N/A;   TONSILLECTOMY AND ADENOIDECTOMY   1960   TRIGGER FINGER RELEASE   08/31/2012    Procedure: MINOR RELEASE TRIGGER FINGER/A-1 PULLEY;  Surgeon: Wyn Forster., MD;  Location: [facility];  Service: Orthopedics;  Laterality: Right;  right long finger   VASECTOMY              Tobacco Use History  Social History       Tobacco Use  Smoking Status Never  Smokeless Tobacco  Never      Social History        Substance and Sexual Activity  Alcohol Use Yes   Alcohol/week: 7.0 standard drinks of alcohol   Types: 7 Cans of beer per week    Comment: consumes one beer daily      Social History         Socioeconomic History   Marital status: Married      Spouse name: GraceAnn   Number of children: 2   Years of education: College   Highest education level: Not on file  Occupational History      Employer: [employer]  Tobacco Use   Smoking status: Never   Smokeless tobacco: Never  Vaping Use   Vaping status: Never Used  Substance and Sexual Activity   Alcohol use: Yes      Alcohol/week: 7.0 standard drinks of alcohol      Types: 7 Cans of beer per week      Comment: consumes one beer daily   Drug use: No   Sexual activity: Not on file  Other Topics Concern   Not on file  Social History Narrative    Patient is married (Blak[place]) and lives at home with his wife.    Patient has two adult children.    Patient is working full-time.    Patient has a Naval architect, Photographer.    Patient is right-handed.    Patient drinks two cups of coffee in the morning.    Social Determinants of Health    Financial Resource Strain: Not on file  Food Insecurity: Not on file  Transportation Needs: Not on file  Physical Activity: Not on file   Stress: Not on file  Social Connections: Not on file  Intimate Partner Violence: Not on file      Allergies       Allergies  Allergen Reactions   Azithromycin Photosensitivity   Tetracyclines & Related        photosensitivity - can take medication but just must avoid being out in the sun    Doxycycline Photosensitivity   Dulcolax Stool Softener [Dss] Nausea And Vomiting   Oxycodone Rash              Current Outpatient Medications  Medication Sig Dispense Refill   acetaminophen (TYLENOL) 325 MG tablet Take 325 mg by mouth every 6 (six) hours as needed for moderate pain.       CALCIUM-MAGNESIUM-ZINC PO Take 1 tablet by mouth 3 (three) times a week.       ELIQUIS 5 MG TABS tablet TAKE 1 TABLET(5 MG) BY MOUTH TWICE DAILY 180 tablet 3   metoprolol tartrate (LOPRESSOR) 25 MG tablet Take 1 tablet (25 mg total) by mouth every 8 (eight) hours as needed. For a fib 30 tablet 5   Multiple Vitamin (MULTIVITAMIN) capsule Take 1 capsule by mouth 3 (three) times a week.       multivitamin-lutein (OCUVITE-LUTEIN) CAPS capsule Take 1 capsule by mouth daily.       Omeprazole-Sodium Bicarbonate (ZEGERID) 20-1100 MG CAPS capsule Take 1-2 capsules by mouth at bedtime.       pantoprazole (PROTONIX) 40 MG tablet Take 20 mg by mouth daily.       polyethylene glycol (MIRALAX / GLYCOLAX) 17 g packet Take 17 g by mouth daily as needed for moderate constipation.       Probiotic Product (ALIGN) 4 MG CAPS Take 4 mg by mouth 3 (three) times a week.  Rimegepant Sulfate (NURTEC) 75 MG TBDP Take prn for migraine po.  Do not exceed 2 a day. 30 tablet 1   sildenafil (VIAGRA) 50 MG tablet Take 50 mg by mouth daily as needed for erectile dysfunction.       Sodium Oxybate 500 MG/ML SOLN Take 2 grams at first dose and 2-3 hours later take 3 grams. 120 mL 5   valACYclovir (VALTREX) 500 MG tablet Take 500 mg by mouth every Monday, Wednesday, and Friday. Mon, Wed Fri       VITAMIN D PO Take 1 tablet by mouth daily.        zaleplon (SONATA) 10 MG capsule Take 10 mg by mouth as needed (when flying overseas).          No current facility-administered medications for this visit.               Family History  Problem Relation Age of Onset   Heart disease Father 62   Hypertension Father     Prostate cancer Father     Cancer Mother          breast,colon,skin   Heart disease Mother     CVA Maternal Grandmother     Microcephaly Maternal Grandmother     Microcephaly Paternal Grandfather                  Physical Exam: Teeth in good repair Lungs: clear Card: RR with 3/6 sem to axillae Ext: no edema Neuro: intact         Diagnostic Studies & Laboratory data: I have personally reviewed the following studies and agree with the findings   TEE (06/2023) IMPRESSIONS     1. Left ventricular ejection fraction, by estimation, is 55 to 60%. The  left ventricle has normal function. The left ventricle has no regional  wall motion abnormalities.   2. Right ventricular systolic function is normal. The right ventricular  size is normal.   3. Left atrial size was mildly dilated. No left atrial/left atrial  appendage thrombus was detected.   4. There was a small PFO noted by bubble study.   5. Severe mitral regurgitation with a partial flail P1 scallop and a  highly eccentric jet of MR starting laterally and heading medially. PISA  ERO 0.44 cm^2, vena contracta area 0.36 cm^2. No pulmonary vein systolic  flow reversal but jet is highly  eccentric. No evidence of mitral stenosis.   6. The aortic valve is tricuspid. Aortic valve regurgitation is trivial.  No aortic stenosis is present.   FINDINGS   Left Ventricle: Left ventricular ejection fraction, by estimation, is 55  to 60%. The left ventricle has normal function. The left ventricle has no  regional wall motion abnormalities. The left ventricular internal cavity  size was normal in size.   Right Ventricle: The right ventricular size is  normal. No increase in  right ventricular wall thickness. Right ventricular systolic function is  normal.   Left Atrium: Left atrial size was mildly dilated. No left atrial/left  atrial appendage thrombus was detected.   Right Atrium: Right atrial size was normal in size.   Pericardium: There is no evidence of pericardial effusion.   Mitral Valve: Severe mitral regurgitation with a partial flail P1 leaflet  and a highly eccentric jet of MR heading laterally to medially. The mitral  valve is abnormal. Severe mitral valve regurgitation. No evidence of  mitral valve stenosis.   Tricuspid Valve: The tricuspid valve is normal in  structure. Tricuspid  valve regurgitation is not demonstrated.   Aortic Valve: The aortic valve is tricuspid. Aortic valve regurgitation is  trivial. No aortic stenosis is present.   Pulmonic Valve: The pulmonic valve was normal in structure. Pulmonic valve  regurgitation is trivial.   Aorta: The aortic root is normal in size and structure.   IAS/Shunts: There was a small PFO noted by bubble study.   Additional Comments: Spectral Doppler performed.   MR Peak grad:    111.9 mmHg  MR Mean grad:    58.0 mmHg  MR Vmax:         529.00 cm/s  MR Vmean:        356.0 cm/s  MR PISA:         6.28 cm  MR PISA Eff ROA: 44 mm  MR PISA Radius:  1.00 cm     Recent Radiology Findings:        Recent Lab Findings: Recent Labs       Lab Results  Component Value Date    WBC 5.5 06/02/2023    HGB 14.6 06/02/2023    HCT 41.5 06/02/2023    PLT 222 06/02/2023    GLUCOSE 103 (H) 06/02/2023    CHOL 193 11/11/2022    TRIG 98 11/11/2022    HDL 52 11/11/2022    LDLCALC 121 (H) 11/11/2022    ALT 25 12/30/2022    AST 28 12/30/2022    NA 134 (L) 06/02/2023    K 4.1 06/02/2023    CL 100 06/02/2023    CREATININE 1.26 (H) 06/02/2023    BUN 17 06/02/2023    CO2 26 06/02/2023            Assessment / Plan:     77 yo male with NYHA class 1 symptoms of severe  structural MR from posterior leaflet (P1) prolapse/flail with normal LV function complicated with paroxysmal afib.  I believe he is a very good candidate for repair however slightly more challenging due to commissural nature of prolapse. With his age and complexity of repair and need to perform MAZE procedure with occlusion of LA appendage, I favor sternotomy approach and pt and wife understand and wish to proceed. He will need to have cardiac cath with right heart pressure measurements and routine preoperative studies. He will need to be off eilquis for 5 days without need for bridging prior to surgery. We discussed the risks, goals and recovery from surgery and that if repair not possible that a tissue valve will be used.

## 2023-08-29 ENCOUNTER — Inpatient Hospital Stay (HOSPITAL_COMMUNITY): Payer: Medicare Other

## 2023-08-29 ENCOUNTER — Encounter (HOSPITAL_COMMUNITY)
Admission: RE | Disposition: A | Discharge status: Home / Self Care | Payer: Self-pay | Source: Home / Self Care | Attending: Thoracic Surgery (Cardiothoracic Vascular Surgery)

## 2023-08-29 ENCOUNTER — Other Ambulatory Visit: Payer: Self-pay

## 2023-08-29 ENCOUNTER — Inpatient Hospital Stay (HOSPITAL_COMMUNITY): Payer: Medicare Other | Admitting: Vascular Surgery

## 2023-08-29 ENCOUNTER — Encounter (HOSPITAL_COMMUNITY): Payer: Self-pay | Admitting: Thoracic Surgery (Cardiothoracic Vascular Surgery)

## 2023-08-29 ENCOUNTER — Inpatient Hospital Stay (HOSPITAL_COMMUNITY)
Admission: RE | Admit: 2023-08-29 | Discharge: 2023-09-02 | DRG: 219 | Disposition: A | Discharge status: Home / Self Care | Payer: Medicare Other | Attending: Thoracic Surgery (Cardiothoracic Vascular Surgery) | Admitting: Thoracic Surgery (Cardiothoracic Vascular Surgery)

## 2023-08-29 DIAGNOSIS — Z7901 Long term (current) use of anticoagulants: Secondary | ICD-10-CM

## 2023-08-29 DIAGNOSIS — Z79899 Other long term (current) drug therapy: Secondary | ICD-10-CM

## 2023-08-29 DIAGNOSIS — Z885 Allergy status to narcotic agent status: Secondary | ICD-10-CM | POA: Diagnosis not present

## 2023-08-29 DIAGNOSIS — Z452 Encounter for adjustment and management of vascular access device: Secondary | ICD-10-CM | POA: Diagnosis not present

## 2023-08-29 DIAGNOSIS — Z9889 Other specified postprocedural states: Principal | ICD-10-CM

## 2023-08-29 DIAGNOSIS — R0989 Other specified symptoms and signs involving the circulatory and respiratory systems: Secondary | ICD-10-CM | POA: Diagnosis not present

## 2023-08-29 DIAGNOSIS — Z888 Allergy status to other drugs, medicaments and biological substances status: Secondary | ICD-10-CM

## 2023-08-29 DIAGNOSIS — Z4682 Encounter for fitting and adjustment of non-vascular catheter: Secondary | ICD-10-CM | POA: Diagnosis not present

## 2023-08-29 DIAGNOSIS — R0689 Other abnormalities of breathing: Secondary | ICD-10-CM | POA: Diagnosis not present

## 2023-08-29 DIAGNOSIS — I341 Nonrheumatic mitral (valve) prolapse: Secondary | ICD-10-CM | POA: Diagnosis not present

## 2023-08-29 DIAGNOSIS — E871 Hypo-osmolality and hyponatremia: Secondary | ICD-10-CM | POA: Diagnosis present

## 2023-08-29 DIAGNOSIS — D72829 Elevated white blood cell count, unspecified: Secondary | ICD-10-CM | POA: Diagnosis not present

## 2023-08-29 DIAGNOSIS — D6959 Other secondary thrombocytopenia: Secondary | ICD-10-CM | POA: Diagnosis not present

## 2023-08-29 DIAGNOSIS — I340 Nonrheumatic mitral (valve) insufficiency: Secondary | ICD-10-CM

## 2023-08-29 DIAGNOSIS — J9811 Atelectasis: Secondary | ICD-10-CM | POA: Diagnosis not present

## 2023-08-29 DIAGNOSIS — N1831 Chronic kidney disease, stage 3a: Secondary | ICD-10-CM

## 2023-08-29 DIAGNOSIS — I4891 Unspecified atrial fibrillation: Secondary | ICD-10-CM

## 2023-08-29 DIAGNOSIS — I511 Rupture of chordae tendineae, not elsewhere classified: Secondary | ICD-10-CM | POA: Diagnosis present

## 2023-08-29 DIAGNOSIS — Z9852 Vasectomy status: Secondary | ICD-10-CM | POA: Diagnosis not present

## 2023-08-29 DIAGNOSIS — K219 Gastro-esophageal reflux disease without esophagitis: Secondary | ICD-10-CM | POA: Diagnosis not present

## 2023-08-29 DIAGNOSIS — I480 Paroxysmal atrial fibrillation: Secondary | ICD-10-CM | POA: Diagnosis present

## 2023-08-29 DIAGNOSIS — I515 Myocardial degeneration: Secondary | ICD-10-CM | POA: Diagnosis not present

## 2023-08-29 DIAGNOSIS — J90 Pleural effusion, not elsewhere classified: Secondary | ICD-10-CM | POA: Diagnosis not present

## 2023-08-29 DIAGNOSIS — Z860100 Personal history of colon polyps, unspecified: Secondary | ICD-10-CM | POA: Diagnosis not present

## 2023-08-29 DIAGNOSIS — Q2112 Patent foramen ovale: Secondary | ICD-10-CM

## 2023-08-29 DIAGNOSIS — Z881 Allergy status to other antibiotic agents status: Secondary | ICD-10-CM | POA: Diagnosis not present

## 2023-08-29 DIAGNOSIS — N189 Chronic kidney disease, unspecified: Secondary | ICD-10-CM | POA: Diagnosis not present

## 2023-08-29 DIAGNOSIS — R918 Other nonspecific abnormal finding of lung field: Secondary | ICD-10-CM | POA: Diagnosis not present

## 2023-08-29 HISTORY — PX: MAZE: SHX5063

## 2023-08-29 HISTORY — PX: CLIPPING OF ATRIAL APPENDAGE: SHX5773

## 2023-08-29 HISTORY — PX: TEE WITHOUT CARDIOVERSION: SHX5443

## 2023-08-29 HISTORY — PX: MITRAL VALVE REPAIR: SHX2039

## 2023-08-29 HISTORY — PX: REPAIR OF PATENT FORAMEN OVALE: SHX6064

## 2023-08-29 LAB — POCT I-STAT, CHEM 8
BUN: 16 mg/dL (ref 8–23)
BUN: 13 mg/dL (ref 8–23)
BUN: 15 mg/dL (ref 8–23)
BUN: 15 mg/dL (ref 8–23)
BUN: 15 mg/dL (ref 8–23)
Calcium, Ion: 1.26 mmol/L (ref 1.15–1.40)
Calcium, Ion: 0.98 mmol/L — ABNORMAL LOW (ref 1.15–1.40)
Calcium, Ion: 1.24 mmol/L (ref 1.15–1.40)
Calcium, Ion: 1.02 mmol/L — ABNORMAL LOW (ref 1.15–1.40)
Calcium, Ion: 1.27 mmol/L (ref 1.15–1.40)
Chloride: 105 mmol/L (ref 98–111)
Chloride: 102 mmol/L (ref 98–111)
Chloride: 104 mmol/L (ref 98–111)
Chloride: 102 mmol/L (ref 98–111)
Chloride: 105 mmol/L (ref 98–111)
Creatinine, Ser: 1.3 mg/dL — ABNORMAL HIGH (ref 0.61–1.24)
Creatinine, Ser: 1.1 mg/dL (ref 0.61–1.24)
Creatinine, Ser: 1.3 mg/dL — ABNORMAL HIGH (ref 0.61–1.24)
Creatinine, Ser: 1.1 mg/dL (ref 0.61–1.24)
Creatinine, Ser: 1.2 mg/dL (ref 0.61–1.24)
Glucose, Bld: 120 mg/dL — ABNORMAL HIGH (ref 70–99)
Glucose, Bld: 104 mg/dL — ABNORMAL HIGH (ref 70–99)
Glucose, Bld: 118 mg/dL — ABNORMAL HIGH (ref 70–99)
Glucose, Bld: 145 mg/dL — ABNORMAL HIGH (ref 70–99)
Glucose, Bld: 159 mg/dL — ABNORMAL HIGH (ref 70–99)
HCT: 36 % — ABNORMAL LOW (ref 39.0–52.0)
HCT: 23 % — ABNORMAL LOW (ref 39.0–52.0)
HCT: 34 % — ABNORMAL LOW (ref 39.0–52.0)
HCT: 26 % — ABNORMAL LOW (ref 39.0–52.0)
HCT: 26 % — ABNORMAL LOW (ref 39.0–52.0)
Hemoglobin: 12.2 g/dL — ABNORMAL LOW (ref 13.0–17.0)
Hemoglobin: 11.6 g/dL — ABNORMAL LOW (ref 13.0–17.0)
Hemoglobin: 7.8 g/dL — ABNORMAL LOW (ref 13.0–17.0)
Hemoglobin: 8.8 g/dL — ABNORMAL LOW (ref 13.0–17.0)
Hemoglobin: 8.8 g/dL — ABNORMAL LOW (ref 13.0–17.0)
Potassium: 4.1 mmol/L (ref 3.5–5.1)
Potassium: 4 mmol/L (ref 3.5–5.1)
Potassium: 4 mmol/L (ref 3.5–5.1)
Potassium: 4.4 mmol/L (ref 3.5–5.1)
Potassium: 4.2 mmol/L (ref 3.5–5.1)
Sodium: 138 mmol/L (ref 135–145)
Sodium: 137 mmol/L (ref 135–145)
Sodium: 138 mmol/L (ref 135–145)
Sodium: 137 mmol/L (ref 135–145)
Sodium: 137 mmol/L (ref 135–145)
TCO2: 25 mmol/L (ref 22–32)
TCO2: 23 mmol/L (ref 22–32)
TCO2: 24 mmol/L (ref 22–32)
TCO2: 25 mmol/L (ref 22–32)
TCO2: 20 mmol/L — ABNORMAL LOW (ref 22–32)

## 2023-08-29 LAB — POCT I-STAT 7, (LYTES, BLD GAS, ICA,H+H)
Acid-Base Excess: 2 mmol/L (ref 0.0–2.0)
Acid-base deficit: 1 mmol/L (ref 0.0–2.0)
Acid-base deficit: 4 mmol/L — ABNORMAL HIGH (ref 0.0–2.0)
Acid-base deficit: 4 mmol/L — ABNORMAL HIGH (ref 0.0–2.0)
Acid-base deficit: 6 mmol/L — ABNORMAL HIGH (ref 0.0–2.0)
Acid-base deficit: 5 mmol/L — ABNORMAL HIGH (ref 0.0–2.0)
Bicarbonate: 22.7 mmol/L (ref 20.0–28.0)
Bicarbonate: 19.8 mmol/L — ABNORMAL LOW (ref 20.0–28.0)
Bicarbonate: 26.7 mmol/L (ref 20.0–28.0)
Bicarbonate: 20.2 mmol/L (ref 20.0–28.0)
Bicarbonate: 19.7 mmol/L — ABNORMAL LOW (ref 20.0–28.0)
Bicarbonate: 21 mmol/L (ref 20.0–28.0)
Calcium, Ion: 1.24 mmol/L (ref 1.15–1.40)
Calcium, Ion: 0.95 mmol/L — ABNORMAL LOW (ref 1.15–1.40)
Calcium, Ion: 1.28 mmol/L (ref 1.15–1.40)
Calcium, Ion: 1.15 mmol/L (ref 1.15–1.40)
Calcium, Ion: 1.16 mmol/L (ref 1.15–1.40)
Calcium, Ion: 1.14 mmol/L — ABNORMAL LOW (ref 1.15–1.40)
HCT: 37 % — ABNORMAL LOW (ref 39.0–52.0)
HCT: 25 % — ABNORMAL LOW (ref 39.0–52.0)
HCT: 27 % — ABNORMAL LOW (ref 39.0–52.0)
HCT: 31 % — ABNORMAL LOW (ref 39.0–52.0)
HCT: 30 % — ABNORMAL LOW (ref 39.0–52.0)
HCT: 29 % — ABNORMAL LOW (ref 39.0–52.0)
Hemoglobin: 12.6 g/dL — ABNORMAL LOW (ref 13.0–17.0)
Hemoglobin: 8.5 g/dL — ABNORMAL LOW (ref 13.0–17.0)
Hemoglobin: 9.2 g/dL — ABNORMAL LOW (ref 13.0–17.0)
Hemoglobin: 10.5 g/dL — ABNORMAL LOW (ref 13.0–17.0)
Hemoglobin: 10.2 g/dL — ABNORMAL LOW (ref 13.0–17.0)
Hemoglobin: 9.9 g/dL — ABNORMAL LOW (ref 13.0–17.0)
O2 Saturation: 100 %
O2 Saturation: 100 %
O2 Saturation: 98 %
O2 Saturation: 99 %
O2 Saturation: 98 %
O2 Saturation: 98 %
Patient temperature: 36.2
Patient temperature: 37
Patient temperature: 37.3
Potassium: 4 mmol/L (ref 3.5–5.1)
Potassium: 3.9 mmol/L (ref 3.5–5.1)
Potassium: 4.1 mmol/L (ref 3.5–5.1)
Potassium: 3.8 mmol/L (ref 3.5–5.1)
Potassium: 4.6 mmol/L (ref 3.5–5.1)
Potassium: 4.5 mmol/L (ref 3.5–5.1)
Sodium: 138 mmol/L (ref 135–145)
Sodium: 138 mmol/L (ref 135–145)
Sodium: 138 mmol/L (ref 135–145)
Sodium: 138 mmol/L (ref 135–145)
Sodium: 138 mmol/L (ref 135–145)
Sodium: 138 mmol/L (ref 135–145)
TCO2: 24 mmol/L (ref 22–32)
TCO2: 21 mmol/L — ABNORMAL LOW (ref 22–32)
TCO2: 28 mmol/L (ref 22–32)
TCO2: 21 mmol/L — ABNORMAL LOW (ref 22–32)
TCO2: 21 mmol/L — ABNORMAL LOW (ref 22–32)
TCO2: 22 mmol/L (ref 22–32)
pCO2 arterial: 35.5 mm[Hg] (ref 32–48)
pCO2 arterial: 29.9 mm[Hg] — ABNORMAL LOW (ref 32–48)
pCO2 arterial: 41.4 mm[Hg] (ref 32–48)
pCO2 arterial: 30.5 mm[Hg] — ABNORMAL LOW (ref 32–48)
pCO2 arterial: 40.6 mm[Hg] (ref 32–48)
pCO2 arterial: 41.1 mm[Hg] (ref 32–48)
pH, Arterial: 7.414 (ref 7.35–7.45)
pH, Arterial: 7.427 (ref 7.35–7.45)
pH, Arterial: 7.417 (ref 7.35–7.45)
pH, Arterial: 7.426 (ref 7.35–7.45)
pH, Arterial: 7.294 — ABNORMAL LOW (ref 7.35–7.45)
pH, Arterial: 7.318 — ABNORMAL LOW (ref 7.35–7.45)
pO2, Arterial: 481 mm[Hg] — ABNORMAL HIGH (ref 83–108)
pO2, Arterial: 352 mm[Hg] — ABNORMAL HIGH (ref 83–108)
pO2, Arterial: 96 mm[Hg] (ref 83–108)
pO2, Arterial: 139 mm[Hg] — ABNORMAL HIGH (ref 83–108)
pO2, Arterial: 123 mm[Hg] — ABNORMAL HIGH (ref 83–108)
pO2, Arterial: 117 mm[Hg] — ABNORMAL HIGH (ref 83–108)

## 2023-08-29 LAB — BASIC METABOLIC PANEL
Anion gap: 8 (ref 5–15)
BUN: 15 mg/dL (ref 8–23)
CO2: 21 mmol/L — ABNORMAL LOW (ref 22–32)
Calcium: 7.9 mg/dL — ABNORMAL LOW (ref 8.9–10.3)
Chloride: 109 mmol/L (ref 98–111)
Creatinine, Ser: 1.3 mg/dL — ABNORMAL HIGH (ref 0.61–1.24)
GFR, Estimated: 57 mL/min — ABNORMAL LOW (ref >60)
Glucose, Bld: 128 mg/dL — ABNORMAL HIGH (ref 70–99)
Potassium: 4.3 mmol/L (ref 3.5–5.1)
Sodium: 138 mmol/L (ref 135–145)

## 2023-08-29 LAB — GLUCOSE, CAPILLARY
Glucose-Capillary: 124 mg/dL — ABNORMAL HIGH (ref 70–99)
Glucose-Capillary: 109 mg/dL — ABNORMAL HIGH (ref 70–99)
Glucose-Capillary: 121 mg/dL — ABNORMAL HIGH (ref 70–99)
Glucose-Capillary: 128 mg/dL — ABNORMAL HIGH (ref 70–99)
Glucose-Capillary: 143 mg/dL — ABNORMAL HIGH (ref 70–99)
Glucose-Capillary: 147 mg/dL — ABNORMAL HIGH (ref 70–99)
Glucose-Capillary: 134 mg/dL — ABNORMAL HIGH (ref 70–99)
Glucose-Capillary: 174 mg/dL — ABNORMAL HIGH (ref 70–99)
Glucose-Capillary: 159 mg/dL — ABNORMAL HIGH (ref 70–99)
Glucose-Capillary: 157 mg/dL — ABNORMAL HIGH (ref 70–99)
Glucose-Capillary: 158 mg/dL — ABNORMAL HIGH (ref 70–99)
Glucose-Capillary: 150 mg/dL — ABNORMAL HIGH (ref 70–99)

## 2023-08-29 LAB — CBC
HCT: 31.8 % — ABNORMAL LOW (ref 39.0–52.0)
HCT: 31 % — ABNORMAL LOW (ref 39.0–52.0)
Hemoglobin: 11.2 g/dL — ABNORMAL LOW (ref 13.0–17.0)
Hemoglobin: 10.9 g/dL — ABNORMAL LOW (ref 13.0–17.0)
MCH: 31.3 pg (ref 26.0–34.0)
MCH: 31.2 pg (ref 26.0–34.0)
MCHC: 35.2 g/dL (ref 30.0–36.0)
MCHC: 35.2 g/dL (ref 30.0–36.0)
MCV: 88.8 fL (ref 80.0–100.0)
MCV: 88.8 fL (ref 80.0–100.0)
Platelets: 122 10*3/uL — ABNORMAL LOW (ref 150–400)
Platelets: 133 10*3/uL — ABNORMAL LOW (ref 150–400)
RBC: 3.58 MIL/uL — ABNORMAL LOW (ref 4.22–5.81)
RBC: 3.49 MIL/uL — ABNORMAL LOW (ref 4.22–5.81)
RDW: 13.2 % (ref 11.5–15.5)
RDW: 13.4 % (ref 11.5–15.5)
WBC: 13.4 10*3/uL — ABNORMAL HIGH (ref 4.0–10.5)
WBC: 13.7 10*3/uL — ABNORMAL HIGH (ref 4.0–10.5)
nRBC: 0 % (ref 0.0–0.2)
nRBC: 0 % (ref 0.0–0.2)

## 2023-08-29 LAB — HEMOGLOBIN AND HEMATOCRIT, BLOOD
HCT: 26.3 % — ABNORMAL LOW (ref 39.0–52.0)
Hemoglobin: 9.2 g/dL — ABNORMAL LOW (ref 13.0–17.0)

## 2023-08-29 LAB — ABO/RH: ABO/RH(D): A NEG

## 2023-08-29 LAB — MAGNESIUM: Magnesium: 3 mg/dL — ABNORMAL HIGH (ref 1.7–2.4)

## 2023-08-29 LAB — APTT: aPTT: 30 s (ref 24–36)

## 2023-08-29 LAB — PROTIME-INR
INR: 1.4 — ABNORMAL HIGH (ref 0.8–1.2)
Prothrombin Time: 17.5 s — ABNORMAL HIGH (ref 11.4–15.2)

## 2023-08-29 LAB — PLATELET COUNT: Platelets: 137 10*3/uL — ABNORMAL LOW (ref 150–400)

## 2023-08-29 SURGERY — REPAIR, MITRAL VALVE
Anesthesia: General | Site: Chest

## 2023-08-29 MED ORDER — ROCURONIUM BROMIDE 10 MG/ML (PF) SYRINGE
PREFILLED_SYRINGE | INTRAVENOUS | Status: DC | PRN
  Administered 2023-08-29 (×3): 20 mg via INTRAVENOUS
  Administered 2023-08-29: 60 mg via INTRAVENOUS
  Administered 2023-08-29: 40 mg via INTRAVENOUS

## 2023-08-29 MED ORDER — FENTANYL CITRATE (PF) 250 MCG/5ML IJ SOLN
INTRAMUSCULAR | Status: AC
  Filled 2023-08-29: qty 5

## 2023-08-29 MED ORDER — DOCUSATE SODIUM 100 MG PO CAPS
200.0000 mg | ORAL_CAPSULE | Freq: Every day | ORAL | Status: DC
  Administered 2023-08-30 – 2023-09-01 (×3): 200 mg via ORAL
  Filled 2023-08-29 (×3): qty 2

## 2023-08-29 MED ORDER — ONDANSETRON HCL 4 MG/2ML IJ SOLN
INTRAMUSCULAR | Status: DC | PRN
  Administered 2023-08-29: 4 mg via INTRAVENOUS

## 2023-08-29 MED ORDER — CHLORHEXIDINE GLUCONATE 0.12 % MT SOLN
15.0000 mL | OROMUCOSAL | Status: AC
  Administered 2023-08-29: 15 mL via OROMUCOSAL
  Filled 2023-08-29: qty 15

## 2023-08-29 MED ORDER — MAGNESIUM SULFATE 4 GM/100ML IV SOLN
4.0000 g | Freq: Once | INTRAVENOUS | Status: AC
  Administered 2023-08-29: 4 g via INTRAVENOUS
  Filled 2023-08-29: qty 100

## 2023-08-29 MED ORDER — CHLORHEXIDINE GLUCONATE 4 % EX SOLN: 30.0000 mL | CUTANEOUS | Status: DC

## 2023-08-29 MED ORDER — PLASMA-LYTE A IV SOLN
INTRAVENOUS | Status: DC | PRN
  Administered 2023-08-29: 1000 mL via INTRAVASCULAR

## 2023-08-29 MED ORDER — MORPHINE SULFATE (PF) 2 MG/ML IV SOLN
1.0000 mg | INTRAVENOUS | Status: DC | PRN
  Administered 2023-08-29 – 2023-08-30 (×2): 1 mg via INTRAVENOUS
  Administered 2023-08-30: 2 mg via INTRAVENOUS
  Filled 2023-08-29 (×5): qty 1

## 2023-08-29 MED ORDER — MIDAZOLAM HCL (PF) 10 MG/2ML IJ SOLN
INTRAMUSCULAR | Status: AC
  Filled 2023-08-29: qty 2

## 2023-08-29 MED ORDER — LACTATED RINGERS IV SOLN: INTRAVENOUS | Status: DC

## 2023-08-29 MED ORDER — METOPROLOL TARTRATE 25 MG/10 ML ORAL SUSPENSION
12.5000 mg | Freq: Two times a day (BID) | ORAL | Status: DC
  Filled 2023-08-29: qty 5

## 2023-08-29 MED ORDER — CEFAZOLIN SODIUM-DEXTROSE 2-4 GM/100ML-% IV SOLN
2.0000 g | Freq: Three times a day (TID) | INTRAVENOUS | Status: AC
  Administered 2023-08-29 – 2023-08-31 (×6): 2 g via INTRAVENOUS
  Filled 2023-08-29 (×6): qty 100

## 2023-08-29 MED ORDER — LACTATED RINGERS IV SOLN: INTRAVENOUS | Status: DC | PRN

## 2023-08-29 MED ORDER — SODIUM CHLORIDE 0.9 % IV SOLN: 250.0000 mL | INTRAVENOUS | Status: DC

## 2023-08-29 MED ORDER — ASPIRIN 81 MG PO CHEW
324.0000 mg | CHEWABLE_TABLET | Freq: Once | ORAL | Status: AC
  Administered 2023-08-29: 324 mg via ORAL
  Filled 2023-08-29: qty 4

## 2023-08-29 MED ORDER — NITROGLYCERIN IN D5W 200-5 MCG/ML-% IV SOLN: 0.0000 ug/min | INTRAVENOUS | Status: DC

## 2023-08-29 MED ORDER — POTASSIUM CHLORIDE 10 MEQ/50ML IV SOLN
10.0000 meq | INTRAVENOUS | Status: AC
  Administered 2023-08-29 (×3): 10 meq via INTRAVENOUS

## 2023-08-29 MED ORDER — DEXMEDETOMIDINE HCL IN NACL 400 MCG/100ML IV SOLN
0.0000 ug/kg/h | INTRAVENOUS | Status: DC
Start: 2023-08-29 — End: 2023-08-30

## 2023-08-29 MED ORDER — PHENYLEPHRINE 80 MCG/ML (10ML) SYRINGE FOR IV PUSH (FOR BLOOD PRESSURE SUPPORT)
PREFILLED_SYRINGE | INTRAVENOUS | Status: DC | PRN
  Administered 2023-08-29: 80 ug via INTRAVENOUS
  Administered 2023-08-29: 160 ug via INTRAVENOUS
  Administered 2023-08-29 (×2): 80 ug via INTRAVENOUS

## 2023-08-29 MED ORDER — VALACYCLOVIR HCL 500 MG PO TABS
500.0000 mg | ORAL_TABLET | ORAL | Status: DC
  Administered 2023-08-31 – 2023-09-02 (×2): 500 mg via ORAL
  Filled 2023-08-29 (×2): qty 1

## 2023-08-29 MED ORDER — ACETAMINOPHEN 160 MG/5ML PO SOLN
650.0000 mg | Freq: Once | ORAL | Status: AC
  Administered 2023-08-29: 650 mg
  Filled 2023-08-29: qty 20.3

## 2023-08-29 MED ORDER — CHLORHEXIDINE GLUCONATE CLOTH 2 % EX PADS
6.0000 | MEDICATED_PAD | Freq: Every day | CUTANEOUS | Status: DC
  Administered 2023-08-29 – 2023-09-01 (×4): 6 via TOPICAL

## 2023-08-29 MED ORDER — TRAMADOL HCL 50 MG PO TABS
50.0000 mg | ORAL_TABLET | ORAL | Status: DC | PRN
  Administered 2023-08-29 – 2023-08-30 (×7): 100 mg via ORAL
  Administered 2023-08-31 – 2023-09-01 (×3): 50 mg via ORAL
  Filled 2023-08-29 (×7): qty 2
  Filled 2023-08-29: qty 1
  Filled 2023-08-29: qty 2
  Filled 2023-08-29: qty 1
  Filled 2023-08-29: qty 2
  Filled 2023-08-29: qty 1

## 2023-08-29 MED ORDER — ORAL CARE MOUTH RINSE: 15.0000 mL | OROMUCOSAL | Status: DC | PRN

## 2023-08-29 MED ORDER — ~~LOC~~ CARDIAC SURGERY, PATIENT & FAMILY EDUCATION
Freq: Once | Status: DC
Start: 2023-08-29 — End: 2023-08-29
  Filled 2023-08-29: qty 1

## 2023-08-29 MED ORDER — SODIUM CHLORIDE 0.9 % IV SOLN: INTRAVENOUS | Status: DC

## 2023-08-29 MED ORDER — PHENYLEPHRINE HCL-NACL 20-0.9 MG/250ML-% IV SOLN
0.0000 ug/min | INTRAVENOUS | Status: DC
  Administered 2023-08-30: 30 ug/min via INTRAVENOUS
  Filled 2023-08-29: qty 250

## 2023-08-29 MED ORDER — PROPOFOL 10 MG/ML IV BOLUS
INTRAVENOUS | Status: DC | PRN
  Administered 2023-08-29: 30 mg via INTRAVENOUS
  Administered 2023-08-29: 70 mg via INTRAVENOUS

## 2023-08-29 MED ORDER — SODIUM CHLORIDE 0.9 % IV SOLN: INTRAVENOUS | Status: DC | PRN

## 2023-08-29 MED ORDER — SODIUM CHLORIDE 0.9% FLUSH
3.0000 mL | Freq: Two times a day (BID) | INTRAVENOUS | Status: DC
  Administered 2023-08-30: 3 mL via INTRAVENOUS

## 2023-08-29 MED ORDER — VANCOMYCIN HCL 1000 MG IV SOLR
INTRAVENOUS | Status: DC | PRN
  Administered 2023-08-29: 1000 mL

## 2023-08-29 MED ORDER — AMIODARONE HCL 200 MG PO TABS
400.0000 mg | ORAL_TABLET | Freq: Two times a day (BID) | ORAL | Status: DC
  Administered 2023-08-30 – 2023-09-01 (×5): 400 mg via ORAL
  Filled 2023-08-29 (×5): qty 2

## 2023-08-29 MED ORDER — MIDAZOLAM HCL 2 MG/2ML IJ SOLN: 2.0000 mg | INTRAMUSCULAR | Status: DC | PRN

## 2023-08-29 MED ORDER — PROPOFOL 10 MG/ML IV BOLUS
INTRAVENOUS | Status: AC
  Filled 2023-08-29: qty 20

## 2023-08-29 MED ORDER — ASPIRIN 81 MG PO CHEW: 324.0000 mg | CHEWABLE_TABLET | Freq: Every day | ORAL | Status: DC

## 2023-08-29 MED ORDER — SODIUM BICARBONATE 8.4 % IV SOLN
50.0000 meq | Freq: Once | INTRAVENOUS | Status: AC
  Administered 2023-08-29: 50 meq via INTRAVENOUS

## 2023-08-29 MED ORDER — METOPROLOL TARTRATE 12.5 MG HALF TABLET
12.5000 mg | ORAL_TABLET | Freq: Two times a day (BID) | ORAL | Status: DC
  Administered 2023-08-30 – 2023-09-01 (×3): 12.5 mg via ORAL
  Filled 2023-08-29 (×5): qty 1

## 2023-08-29 MED ORDER — EPINEPHRINE 1 MG/10ML IJ SOSY
PREFILLED_SYRINGE | INTRAMUSCULAR | Status: AC
  Filled 2023-08-29: qty 10

## 2023-08-29 MED ORDER — SODIUM OXYBATE 500 MG/ML PO SOLN
2000.0000 mg | Freq: Two times a day (BID) | ORAL | Status: DC
Start: 2023-08-29 — End: 2023-08-30

## 2023-08-29 MED ORDER — VANCOMYCIN HCL IN DEXTROSE 1-5 GM/200ML-% IV SOLN
1000.0000 mg | Freq: Once | INTRAVENOUS | Status: AC
  Administered 2023-08-29: 1000 mg via INTRAVENOUS
  Filled 2023-08-29: qty 200

## 2023-08-29 MED ORDER — MIDAZOLAM HCL (PF) 5 MG/ML IJ SOLN
INTRAMUSCULAR | Status: DC | PRN
  Administered 2023-08-29: 1 mg via INTRAVENOUS
  Administered 2023-08-29 (×2): 2 mg via INTRAVENOUS
  Administered 2023-08-29: 1 mg via INTRAVENOUS

## 2023-08-29 MED ORDER — ORAL CARE MOUTH RINSE
15.0000 mL | OROMUCOSAL | Status: DC
  Administered 2023-08-29: 15 mL via OROMUCOSAL

## 2023-08-29 MED ORDER — ALBUMIN HUMAN 5 % IV SOLN: INTRAVENOUS | Status: DC | PRN

## 2023-08-29 MED ORDER — SODIUM CHLORIDE 0.9% FLUSH: 3.0000 mL | INTRAVENOUS | Status: DC | PRN

## 2023-08-29 MED ORDER — FLUTICASONE PROPIONATE 50 MCG/ACT NA SUSP
1.0000 | Freq: Every day | NASAL | Status: DC | PRN
  Filled 2023-08-29: qty 16

## 2023-08-29 MED ORDER — PLASMA-LYTE A IV SOLN
INTRAVENOUS | Status: DC
  Filled 2023-08-29: qty 5

## 2023-08-29 MED ORDER — ONDANSETRON HCL 4 MG/2ML IJ SOLN: 4.0000 mg | Freq: Four times a day (QID) | INTRAMUSCULAR | Status: DC | PRN

## 2023-08-29 MED ORDER — ALBUMIN HUMAN 5 % IV SOLN
250.0000 mL | INTRAVENOUS | Status: AC | PRN
  Administered 2023-08-29 – 2023-08-30 (×5): 12.5 g via INTRAVENOUS
  Filled 2023-08-29 (×3): qty 250

## 2023-08-29 MED ORDER — EPHEDRINE SULFATE-NACL 50-0.9 MG/10ML-% IV SOSY
PREFILLED_SYRINGE | INTRAVENOUS | Status: DC | PRN
  Administered 2023-08-29 (×2): 5 mg via INTRAVENOUS

## 2023-08-29 MED ORDER — PANTOPRAZOLE SODIUM 40 MG IV SOLR
40.0000 mg | Freq: Every day | INTRAVENOUS | Status: AC
  Administered 2023-08-29 – 2023-08-30 (×2): 40 mg via INTRAVENOUS
  Filled 2023-08-29 (×2): qty 10

## 2023-08-29 MED ORDER — PROTAMINE SULFATE 10 MG/ML IV SOLN
INTRAVENOUS | Status: DC | PRN
  Administered 2023-08-29: 270 mg via INTRAVENOUS
  Administered 2023-08-29: 20 mg via INTRAVENOUS

## 2023-08-29 MED ORDER — RIMEGEPANT SULFATE 75 MG PO TBDP: 75.0000 mg | ORAL_TABLET | Freq: Two times a day (BID) | ORAL | Status: DC | PRN

## 2023-08-29 MED ORDER — ACETAMINOPHEN 500 MG PO TABS
1000.0000 mg | ORAL_TABLET | Freq: Four times a day (QID) | ORAL | Status: DC
  Administered 2023-08-30 – 2023-09-01 (×11): 1000 mg via ORAL
  Filled 2023-08-29 (×10): qty 2

## 2023-08-29 MED ORDER — METOPROLOL TARTRATE 12.5 MG HALF TABLET: 12.5000 mg | ORAL_TABLET | Freq: Once | ORAL | Status: DC

## 2023-08-29 MED ORDER — METOPROLOL TARTRATE 5 MG/5ML IV SOLN: 2.5000 mg | INTRAVENOUS | Status: DC | PRN

## 2023-08-29 MED ORDER — NITROGLYCERIN 0.2 MG/ML ON CALL CATH LAB
INTRAVENOUS | Status: DC | PRN
  Administered 2023-08-29: 10 ug via INTRAVENOUS

## 2023-08-29 MED ORDER — FENTANYL CITRATE (PF) 250 MCG/5ML IJ SOLN
INTRAMUSCULAR | Status: DC | PRN
  Administered 2023-08-29 (×2): 100 ug via INTRAVENOUS
  Administered 2023-08-29: 150 ug via INTRAVENOUS
  Administered 2023-08-29: 350 ug via INTRAVENOUS
  Administered 2023-08-29: 50 ug via INTRAVENOUS

## 2023-08-29 MED ORDER — INSULIN REGULAR(HUMAN) IN NACL 100-0.9 UT/100ML-% IV SOLN: INTRAVENOUS | Status: DC

## 2023-08-29 MED ORDER — SODIUM CHLORIDE 0.45 % IV SOLN: INTRAVENOUS | Status: DC | PRN

## 2023-08-29 MED ORDER — ASPIRIN 325 MG PO TBEC: 325.0000 mg | DELAYED_RELEASE_TABLET | Freq: Every day | ORAL | Status: DC

## 2023-08-29 MED ORDER — HEPARIN SODIUM (PORCINE) 1000 UNIT/ML IJ SOLN
INTRAMUSCULAR | Status: DC | PRN
  Administered 2023-08-29: 29000 [IU] via INTRAVENOUS

## 2023-08-29 MED ORDER — 0.9 % SODIUM CHLORIDE (POUR BTL) OPTIME
TOPICAL | Status: DC | PRN
  Administered 2023-08-29: 1000 mL
  Administered 2023-08-29: 5000 mL

## 2023-08-29 MED ORDER — CHLORHEXIDINE GLUCONATE 0.12 % MT SOLN
15.0000 mL | Freq: Once | OROMUCOSAL | Status: AC
  Administered 2023-08-29: 15 mL via OROMUCOSAL
  Filled 2023-08-29: qty 15

## 2023-08-29 MED ORDER — DEXTROSE 50 % IV SOLN: 0.0000 mL | INTRAVENOUS | Status: DC | PRN

## 2023-08-29 MED ORDER — PANTOPRAZOLE SODIUM 40 MG PO TBEC
40.0000 mg | DELAYED_RELEASE_TABLET | Freq: Every day | ORAL | Status: DC
  Administered 2023-08-31 – 2023-09-01 (×2): 40 mg via ORAL
  Filled 2023-08-29 (×2): qty 1

## 2023-08-29 MED ORDER — METOCLOPRAMIDE HCL 5 MG/ML IJ SOLN
10.0000 mg | Freq: Four times a day (QID) | INTRAMUSCULAR | Status: AC
  Administered 2023-08-29 – 2023-08-30 (×6): 10 mg via INTRAVENOUS
  Filled 2023-08-29 (×6): qty 2

## 2023-08-29 MED ORDER — ACETAMINOPHEN 160 MG/5ML PO SOLN: 1000.0000 mg | Freq: Four times a day (QID) | ORAL | Status: DC

## 2023-08-29 SURGICAL SUPPLY — 79 items
ADAPTER CARDIO PERF ANTE/RETRO (ADAPTER) IMPLANT
ANTIFOG SOL W/FOAM PAD STRL (MISCELLANEOUS) ×3 IMPLANT
BAG DECANTER FOR FLEXI CONT (MISCELLANEOUS) ×3 IMPLANT
BLADE CLIPPER SURG (BLADE) ×3 IMPLANT
BLADE STERNUM SYSTEM 6 (BLADE) ×3 IMPLANT
CANISTER SUCT 3000ML PPV (MISCELLANEOUS) ×3 IMPLANT
CANN PRFSN 3/8XCNCT ST RT ANG (MISCELLANEOUS) ×3 IMPLANT
CANNULA NON VENT 20FR 12 (CANNULA) ×3 IMPLANT
CANNULA NON VENT 22FR 12 (CANNULA) ×3 IMPLANT
CANNULA PRFSN 3/8XCNCT RT ANG (MISCELLANEOUS) IMPLANT
CANNULA SUMP PERICARDIAL (CANNULA) ×3 IMPLANT
CANNULA VRC MALB SNGL STG 36FR (MISCELLANEOUS) IMPLANT
CATH HEART VENT LEFT (CATHETERS) ×3 IMPLANT
CATH RETROPLEGIA CORONARY 14FR (CATHETERS) IMPLANT
CATH ROBINSON RED A/P 18FR (CATHETERS) ×9 IMPLANT
CATH THOR STR 32F SOFT 20 RADI (CATHETERS) ×3 IMPLANT
CATH THORACIC 28FR RT ANG (CATHETERS) ×3 IMPLANT
CLAMP ISOLATOR SYNERGY LG (MISCELLANEOUS) IMPLANT
CLAMP SUTURE YELLOW 5 PAIRS (MISCELLANEOUS) ×3 IMPLANT
CLIP TI MEDIUM 24 (CLIP) IMPLANT
CLIP TI WIDE RED SMALL 24 (CLIP) IMPLANT
CONNECTOR 1/2X3/8X1/2 3WAY (MISCELLANEOUS) IMPLANT
DEVICE SUT CK QUICK LOAD MINI (Prosthesis & Implant Heart) IMPLANT
DRAPE CV SPLIT W-CLR ANES SCRN (DRAPES) ×3 IMPLANT
DRAPE INCISE IOBAN 66X45 STRL (DRAPES) ×3 IMPLANT
DRAPE PERI GROIN 82X75IN TIB (DRAPES) ×3 IMPLANT
DRSG AQUACEL AG ADV 3.5X10 (GAUZE/BANDAGES/DRESSINGS) ×3 IMPLANT
ELECT CAUTERY BLADE 6.4 (BLADE) ×3 IMPLANT
ELECT REM PT RETURN 9FT ADLT (ELECTROSURGICAL) ×6 IMPLANT
ELECTRODE REM PT RTRN 9FT ADLT (ELECTROSURGICAL) ×6 IMPLANT
FELT TEFLON 1X6 (MISCELLANEOUS) ×3 IMPLANT
GAUZE SPONGE 4X4 12PLY STRL (GAUZE/BANDAGES/DRESSINGS) ×3 IMPLANT
GLOVE BIO SURGEON STRL SZ7 (GLOVE) IMPLANT
GOWN STRL REUS W/ TWL LRG LVL3 (GOWN DISPOSABLE) ×18 IMPLANT
HEMOSTAT SURGICEL 2X14 (HEMOSTASIS) IMPLANT
INSERT FOGARTY XLG (MISCELLANEOUS) ×3 IMPLANT
IRRIG SUCT STRYKERFLOW 2 WTIP (MISCELLANEOUS) ×3 IMPLANT
IRRIGATION SUCT STRKRFLW 2 WTP (MISCELLANEOUS) IMPLANT
KIT BASIN OR (CUSTOM PROCEDURE TRAY) ×3 IMPLANT
KIT SUT CK MINI COMBO 4X17 (Prosthesis & Implant Heart) IMPLANT
KIT TURNOVER KIT B (KITS) ×3 IMPLANT
LINE VENT (MISCELLANEOUS) IMPLANT
NS IRRIG 1000ML POUR BTL (IV SOLUTION) ×18 IMPLANT
ORGANIZER SUTURE GABBAY-FRATER (MISCELLANEOUS) ×3 IMPLANT
PACK E OPEN HEART (SUTURE) ×3 IMPLANT
PACK OPEN HEART (CUSTOM PROCEDURE TRAY) ×3 IMPLANT
PAD ARMBOARD 7.5X6 YLW CONV (MISCELLANEOUS) ×6 IMPLANT
PAD ELECT DEFIB RADIOL ZOLL (MISCELLANEOUS) ×3 IMPLANT
PENCIL BUTTON HOLSTER BLD 10FT (ELECTRODE) ×3 IMPLANT
POSITIONER HEAD DONUT 9IN (MISCELLANEOUS) ×3 IMPLANT
PROBE CRYO2-ABLATION MALLABLE (MISCELLANEOUS) IMPLANT
RING ANNULOPLASTY SIMULUS 34 (Prosthesis & Implant Heart) IMPLANT
SET MPS 3-ND DEL (MISCELLANEOUS) IMPLANT
SOLUTION ANTFG W/FOAM PAD STRL (MISCELLANEOUS) ×3 IMPLANT
SUT ETHIBOND 2 0 SH 36X2 (SUTURE) IMPLANT
SUT ETHIBOND 3 0 SH 1 (SUTURE) IMPLANT
SUT MNCRL AB 4-0 PS2 18 (SUTURE) ×6 IMPLANT
SUT PROLENE 4 0 SH DA (SUTURE) ×9 IMPLANT
SUT PROLENE 4-0 RB1 .5 CRCL 36 (SUTURE) ×6 IMPLANT
SUT PROLENE 5 0 RB 2 (SUTURE) IMPLANT
SUT PROLENE 6 0 C 1 30 (SUTURE) IMPLANT
SUT STEEL 6MS V (SUTURE) ×3 IMPLANT
SUT STEEL SZ 6 DBL 3X14 BALL (SUTURE) ×6 IMPLANT
SUT VIC AB 0 CTX36XBRD ANTBCTR (SUTURE) ×6 IMPLANT
SUT VIC AB 2-0 CT1 TAPERPNT 27 (SUTURE) ×6 IMPLANT
SYR BULB IRRIG 60ML STRL (SYRINGE) IMPLANT
SYS CLIP PENDITURE LAA 50 (Clip) ×3 IMPLANT
SYSTEM CLIP PENDITURE LAA 50 (Clip) IMPLANT
SYSTEM SAHARA CHEST DRAIN ATS (WOUND CARE) ×3 IMPLANT
TAG SUTURE CLAMP YLW 5PR (MISCELLANEOUS) ×3 IMPLANT
TAPE CLOTH SURG 4X10 WHT LF (GAUZE/BANDAGES/DRESSINGS) IMPLANT
TAPE PAPER 2X10 WHT MICROPORE (GAUZE/BANDAGES/DRESSINGS) IMPLANT
TOWEL GREEN STERILE (TOWEL DISPOSABLE) ×3 IMPLANT
TOWEL GREEN STERILE FF (TOWEL DISPOSABLE) ×3 IMPLANT
TRAY FOLEY SLVR 16FR TEMP STAT (SET/KITS/TRAYS/PACK) ×3 IMPLANT
UNDERPAD 30X36 HEAVY ABSORB (UNDERPADS AND DIAPERS) ×3 IMPLANT
VENT LEFT HEART 12002 (CATHETERS) ×3 IMPLANT
VRC MALLEABLE SINGLE STG 36FR (MISCELLANEOUS) ×3 IMPLANT
WATER STERILE IRR 1000ML POUR (IV SOLUTION) ×6 IMPLANT

## 2023-08-29 NOTE — Interval H&P Note (Signed)
History and Physical Interval Note:  08/29/2023 6:27 AM  Christopher Weber  has presented today for surgery, with the diagnosis of MR AFIB.  The various methods of treatment have been discussed with the patient and family. After consideration of risks, benefits and other options for treatment, the patient has consented to  Procedure(s): MITRAL VALVE REPAIR (MVR) POSSIBLE REPLACEMENT (N/A) MAZE (N/A) TRANSESOPHAGEAL ECHOCARDIOGRAM (N/A) as a surgical intervention.  The patient's history has been reviewed, patient examined, no change in status, stable for surgery.  I have reviewed the patient's chart and labs.  Questions were answered to the patient's satisfaction.     Eugenio Hoes

## 2023-08-29 NOTE — Brief Op Note (Signed)
08/29/2023  11:35 AM  PATIENT:  Christopher Weber  77 y.o. male  PRE-OPERATIVE DIAGNOSIS:  MITRAL VALVE REGURGITATION AND ATRIAL FIBRILLATION  POST-OPERATIVE DIAGNOSIS:  MITRAL VALVE REGURGITATION AND ATRIAL FIBRILLATION  PROCEDURE:  MITRAL VALVE REPAIR USING SIMULUS ANNULOPLASTY BAND  MAZE  TRANSESOPHAGEAL ECHOCARDIOGRAM CLIPPING OF ATRIAL APPENDAGE USING MEDTRONIC 5OMM CLIP CLOSURE OF PATENT FORAMEN OVALE  SURGEON:  Surgeons and Role:    Eugenio Hoes, MD - Primary  PHYSICIAN ASSISTANT: Aloha Gell PA-C  ASSISTANTS: Valene Bors RNFA student   ANESTHESIA:   general  EBL:  750 mL   BLOOD ADMINISTERED:none  DRAINS:  mediastinal drains    LOCAL MEDICATIONS USED:  NONE  SPECIMEN:  Source of Specimen:  P1 leaflet of mitral valve  DISPOSITION OF SPECIMEN:  PATHOLOGY  COUNTS:  YES  DICTATION: .Dragon Dictation  PLAN OF CARE: Admit to inpatient   PATIENT DISPOSITION:  ICU - intubated and hemodynamically stable.   Delay start of Pharmacological VTE agent (>24hrs) due to surgical blood loss or risk of bleeding: yes

## 2023-08-29 NOTE — Anesthesia Procedure Notes (Signed)
Arterial Line Insertion Start/End12/16/2024 6:45 AM, 08/29/2023 6:50 AM Performed by: Achille Rich, MD, Kayleen Memos, CRNA, CRNA  Patient location: Pre-op. Preanesthetic checklist: patient identified, IV checked, site marked, risks and benefits discussed, surgical consent, monitors and equipment checked, pre-op evaluation, timeout performed and anesthesia consent Lidocaine 1% used for infiltration Left, radial was placed Catheter size: 20 G Hand hygiene performed  Allen's test indicative of satisfactory collateral circulation Attempts: 1 Procedure performed without using ultrasound guided technique. Following insertion, dressing applied and Biopatch. Post procedure assessment: normal  Patient tolerated the procedure well with no immediate complications.

## 2023-08-29 NOTE — Anesthesia Procedure Notes (Signed)
Procedure Name: Intubation Date/Time: 08/29/2023 7:46 AM  Performed by: Waynard Edwards, CRNAPre-anesthesia Checklist: Patient identified, Emergency Drugs available, Suction available and Patient being monitored Patient Re-evaluated:Patient Re-evaluated prior to induction Oxygen Delivery Method: Circle System Utilized Preoxygenation: Pre-oxygenation with 100% oxygen Induction Type: IV induction Ventilation: Mask ventilation without difficulty Laryngoscope Size: Mac and 4 Grade View: Grade I Tube type: Oral Number of attempts: 1 Airway Equipment and Method: Stylet Placement Confirmation: ETT inserted through vocal cords under direct vision, positive ETCO2 and breath sounds checked- equal and bilateral Secured at: 23 cm Tube secured with: Tape Dental Injury: Teeth and Oropharynx as per pre-operative assessment

## 2023-08-29 NOTE — Anesthesia Preprocedure Evaluation (Signed)
Anesthesia Evaluation  Patient identified by MRN, date of birth, ID band Patient awake    Reviewed: Allergy & Precautions, H&P , NPO status , Patient's Chart, lab work & pertinent test results  Airway Mallampati: II   Neck ROM: full    Dental   Pulmonary shortness of breath   breath sounds clear to auscultation       Cardiovascular + dysrhythmias Atrial Fibrillation + Valvular Problems/Murmurs MR  Rhythm:irregular Rate:Normal  Normal EF, flail P1 segment of mitral valve causing severe MR.   Neuro/Psych  Headaches    GI/Hepatic ,GERD  ,,  Endo/Other    Renal/GU Renal InsufficiencyRenal disease     Musculoskeletal  (+) Arthritis ,    Abdominal   Peds  Hematology   Anesthesia Other Findings   Reproductive/Obstetrics                             Anesthesia Physical Anesthesia Plan  ASA: 3  Anesthesia Plan: General   Post-op Pain Management:    Induction: Intravenous  PONV Risk Score and Plan: 2 and Ondansetron, Dexamethasone, Midazolam and Treatment may vary due to age or medical condition  Airway Management Planned: Oral ETT  Additional Equipment: Arterial line, CVP, PA Cath, TEE and Ultrasound Guidance Line Placement  Intra-op Plan:   Post-operative Plan:   Informed Consent: I have reviewed the patients History and Physical, chart, labs and discussed the procedure including the risks, benefits and alternatives for the proposed anesthesia with the patient or authorized representative who has indicated his/her understanding and acceptance.     Dental advisory given  Plan Discussed with: CRNA, Anesthesiologist and Surgeon  Anesthesia Plan Comments:        Anesthesia Quick Evaluation

## 2023-08-29 NOTE — Progress Notes (Signed)
      [address]       [place] [address]             [phone]    S/p MV repair, Maze, closure of PFO  Extubated, comfortable  BP 126/79   Pulse 78   Temp 99.1 F (37.3 C)   Resp 16   Ht 5\' 7"  (1.702 m)   Wt 73.9 kg   SpO2 98%   BMI 25.53 kg/m    PA 25/8 CI 2.9  on Neo at 30   Intake/Output Summary (Last 24 hours) at 08/29/2023 1747 Last data filed at 08/29/2023 1600 Gross per 24 hour  Intake 4109.84 ml  Output 2210 ml  Net 1899.84 ml   ~ 350 from CT since OR  Hct 29, K 4.5  Doing well early postop  Viviann Spare C. Dorris Fetch, MD [facility] [phone]

## 2023-08-29 NOTE — Consult Note (Signed)
NAME:  [name], MRN:  [id number], DOB:  1946-03-28, LOS: 0 ADMISSION DATE:  08/29/2023, CONSULTATION DATE:  12/16 REFERRING MD:  Dr. Leafy Ro, CHIEF COMPLAINT:  Postop medical management    History of Present Illness:  Christopher Weber is a 77 year old male with an extensive past medical history significant for severe mitral valve regurgitation secondary to prolapse, paroxysmal atrial fibrillation anticoagulated with Eliquis, CKD, and arthritis who presented for elective mitral valve repair, maze procedure with pulmonary vein isolation, clipping of left atrial appendage and closure of foramen ovale.  Patient was transferred to ICU, PCCM was consulted for help evaluation and medical management  Pertinent  Medical History  Severe mitral valve regurgitation secondary to prolapse, paroxysmal atrial fibrillation anticoagulated with Eliquis, CKD, and arthritis  Significant Hospital Events: Including procedures, antibiotic start and stop dates in addition to other pertinent events   12/16 elective mitral valve repair with Dr. Leafy Ro 12/16 as above  Interim History / Subjective:  As above  Objective   Blood pressure 123/76, pulse 63, temperature 97.8 F (36.6 C), temperature source Oral, resp. rate 16, height 5\' 7"  (1.702 m), weight 73.9 kg, SpO2 98%. PAP: (13-27)/(6-12) 27/12      Intake/Output Summary (Last 24 hours) at 08/29/2023 0954 Last data filed at 08/29/2023 2130 Gross per 24 hour  Intake 1200 ml  Output 375 ml  Net 825 ml   Filed Weights   08/29/23 0622  Weight: 73.9 kg    Examination: General: Elderly male, orally intubated HEENT: [place]/AT, eyes anicteric.  ETT and OGT in place Neuro: Sedated, not following commands.  Eyes are closed.  Pupils 3 mm bilateral reactive to light Chest: Central sternotomy incision looks clean and dry, coarse breath sounds, no wheezes or rhonchi.  Mediastinal and chest tube in place Heart: Regular rate and rhythm, no murmurs or  gallops Abdomen: Soft, nondistended, bowel sounds present Skin: No rash  Labs and images reviewed  Resolved Hospital Problem list     Assessment & Plan:  Severe mitral regurgitation secondary to prolapse now mitral valve repair Paroxysmal atrial fibrillation anticoagulated with Eliquis S/p MAZE procedure with pulmonary vein isolation and clipping of left atrial appendage Patent foreman ovale status post closure Patient is in sinus rhythm Continuous telemetry Monitor chest tube output Currently on low-dose phenylephrine, titrate with MAP goal 65 Continue amiodarone Continue aspirin Continue to titrate Precedex with RASS goal 0/-1 Continue pain control with tramadol, oxycodone and morphine  Acute respiratory insufficiency, postop Continue on protective ventilation VAP prevention bundle in place Rapid weaning protocol ordered is in place  Expected perioperative blood loss anemia Thrombocytopenia due to CPB Monitor H/H and PLT counts  CKD stage 3a Creatinine in 08/25/2023 1.32 with GFR 56 Follow renal function  Monitor urine output Trend Bmet Avoid nephrotoxins Ensure adequate renal perfusion   Best Practice (right click and "Reselect all SmartList Selections" daily)   Diet/type: NPO DVT prophylaxis: SCD GI prophylaxis: PPI Lines: Central line, Arterial Line, and yes and it is still needed Foley:  Yes, and it is still needed Code Status:  full code Last date of multidisciplinary goals of care discussion [Per primary team]   Labs   CBC: Recent Labs  Lab 08/25/23 1230 08/29/23 0757 08/29/23 0802 08/29/23 0831 08/29/23 0852 08/29/23 0900 08/29/23 0945  WBC 6.0  --   --   --   --   --   --   HGB 14.9   < > 12.2* 11.6* 7.8* 8.5* 8.8*  HCT 43.5   < >  36.0* 34.0* 23.0* 25.0* 26.0*  MCV 89.5  --   --   --   --   --   --   PLT 251  --   --   --   --   --   --    < > = values in this interval not displayed.    Basic Metabolic Panel: Recent Labs  Lab  08/25/23 1230 08/29/23 0757 08/29/23 0802 08/29/23 0831 08/29/23 0852 08/29/23 0900 08/29/23 0945  NA 137   < > 138 137 138 138 137  K 4.1   < > 4.1 4.0 4.0 3.9 4.4  CL 102  --  105 104 102  --  102  CO2 27  --   --   --   --   --   --   GLUCOSE 98  --  120* 118* 104*  --  145*  BUN 13  --  16 15 13   --  15  CREATININE 1.32*  --  1.30* 1.30* 1.10  --  1.10  CALCIUM 9.5  --   --   --   --   --   --    < > = values in this interval not displayed.   GFR: Estimated Creatinine Clearance: 54.2 mL/min (by C-G formula based on SCr of 1.1 mg/dL). Recent Labs  Lab 08/25/23 1230  WBC 6.0    Liver Function Tests: Recent Labs  Lab 08/25/23 1230  AST 27  ALT 22  ALKPHOS 38  BILITOT 0.8  PROT 6.9  ALBUMIN 4.0   No results for input(s): "LIPASE", "AMYLASE" in the last 168 hours. No results for input(s): "AMMONIA" in the last 168 hours.  ABG    Component Value Date/Time   PHART 7.427 08/29/2023 0900   PCO2ART 29.9 (L) 08/29/2023 0900   PO2ART 352 (H) 08/29/2023 0900   HCO3 19.8 (L) 08/29/2023 0900   TCO2 25 08/29/2023 0945   ACIDBASEDEF 4.0 (H) 08/29/2023 0900   O2SAT 100 08/29/2023 0900     Coagulation Profile: Recent Labs  Lab 08/25/23 1230  INR 1.0    Cardiac Enzymes: No results for input(s): "CKTOTAL", "CKMB", "CKMBINDEX", "TROPONINI" in the last 168 hours.  HbA1C: Hgb A1c MFr Bld  Date/Time Value Ref Range Status  08/25/2023 11:50 AM 5.3 4.8 - 5.6 % Final    Comment:    (NOTE) Pre diabetes:          5.7%-6.4%  Diabetes:              >6.4%  Glycemic control for   <7.0% adults with diabetes     CBG: No results for input(s): "GLUCAP" in the last 168 hours.  Review of Systems:   Unable to obtain as patient is intubated and sedated  Past Medical History:  He,  has a past medical history of A-fib (HCC), Actinic keratoses, Arthritis, Back pain, CKD (chronic kidney disease), Colon polyp (2015), Diverticulitis, Dyspnea, Dysrhythmia, Fever blister,  Generalized headaches, GERD (gastroesophageal reflux disease), Hepatitis A (1985), History of hiatal hernia, Hypersomnia, Knee injuries (2002), Migraine headache with aura (08/29/2014), Narcolepsy without cataplexy (04/12/2013), Pain, Rotator cuff (capsule) sprain (08/29/2014), Seborrhea, Severe mitral regurgitation, and Tendinitis.   Surgical History:   Past Surgical History:  Procedure Laterality Date   COLONOSCOPY  2011-last   every 5 years   FINGER SURGERY  2009/2010   trigger finger of left hand     HERNIA REPAIR  09/13/1961   right inguinal    LASIK  1998 or 99   QUADRICEPS TENDON REPAIR Right 04/2022   RIGHT/LEFT HEART CATH AND CORONARY ANGIOGRAPHY N/A 08/19/2023   Procedure: RIGHT/LEFT HEART CATH AND CORONARY ANGIOGRAPHY;  Surgeon: Laurey Morale, MD;  Location: [facility] INVASIVE CV LAB;  Service: Cardiovascular;  Laterality: N/A;   TEE WITHOUT CARDIOVERSION N/A 06/24/2023   Procedure: TRANSESOPHAGEAL ECHOCARDIOGRAM;  Surgeon: Laurey Morale, MD;  Location: [facility] INVASIVE CV LAB;  Service: Cardiovascular;  Laterality: N/A;   TONSILLECTOMY AND ADENOIDECTOMY  09/13/1958   TOTAL ANKLE REPLACEMENT Right 2022   TRIGGER FINGER RELEASE  08/31/2012   Procedure: MINOR RELEASE TRIGGER FINGER/A-1 PULLEY;  Surgeon: Wyn Forster., MD;  Location: [facility];  Service: Orthopedics;  Laterality: Right;  right long finger   UPPER GASTROINTESTINAL ENDOSCOPY     VASECTOMY       Social History:   reports that he has never smoked. He has never used smokeless tobacco. He reports that he does not currently use alcohol after a past usage of about 1.0 standard drink of alcohol per week. He reports that he does not use drugs.   Family History:  His family history includes CVA in his maternal grandmother; Cancer in his mother; Heart disease in his mother; Heart disease (age of onset: 32) in his father; Hypertension in his father; Microcephaly in his maternal grandmother and paternal  grandfather; Prostate cancer in his father.   Allergies Allergies  Allergen Reactions   Azithromycin Photosensitivity   Tetracyclines & Related     photosensitivity - can take medication but just must avoid being out in the sun    Doxycycline Photosensitivity   Dulcolax Stool Softener [Dss] Nausea And Vomiting   Oxycodone Rash     Home Medications  Prior to Admission medications   Medication Sig Start Date End Date Taking? Authorizing Provider  acetaminophen (TYLENOL) 325 MG tablet Take 325 mg by mouth every 6 (six) hours as needed for moderate pain.   Yes [provider]  CALCIUM-MAGNESIUM-ZINC PO Take 1 tablet by mouth 3 (three) times a week.   Yes [provider]  fluticasone (FLONASE) 50 MCG/ACT nasal spray Place 1 spray into both nostrils daily as needed for allergies or rhinitis.   Yes [provider]  metoprolol tartrate (LOPRESSOR) 25 MG tablet Take 1 tablet (25 mg total) by mouth every 8 (eight) hours as needed. For a fib 12/16/22 12/16/23 Yes Laurey Morale, MD  Multiple Vitamin (MULTIVITAMIN) capsule Take 1 capsule by mouth 3 (three) times a week.   Yes [provider]  multivitamin-lutein (OCUVITE-LUTEIN) CAPS capsule Take 1 capsule by mouth daily.   Yes [provider]  Omeprazole-Sodium Bicarbonate (ZEGERID) 20-1100 MG CAPS capsule Take 1-2 capsules by mouth at bedtime.   Yes [provider]  pantoprazole (PROTONIX) 40 MG tablet Take 20 mg by mouth daily.   Yes [provider]  polyethylene glycol (MIRALAX / GLYCOLAX) 17 g packet Take 17 g by mouth See admin instructions. Take 17 g daily every night except skip dose on Sundays   Yes [provider]  Probiotic Product (ALIGN) 4 MG CAPS Take 4 mg by mouth 3 (three) times a week.   Yes [provider]  Sodium Oxybate 500 MG/ML SOLN Take 2 grams at first dose and 2-3 hours later take 3 grams. 07/07/23  Yes Dohmeier, Porfirio Mylar, MD  valACYclovir (VALTREX)  1000 MG tablet Take 500 mg by mouth every Monday, Wednesday, and Friday.   Yes [provider]  VITAMIN D PO Take 1 tablet by mouth daily.   Yes [provider]  zaleplon (SONATA) 10 MG capsule Take 10 mg by mouth as needed (when flying overseas).   Yes [provider]  apixaban (ELIQUIS) 5 MG TABS tablet Take 1 tablet (5 mg total) by mouth 2 (two) times daily. 08/20/23   Laurey Morale, MD  Rimegepant Sulfate (NURTEC) 75 MG TBDP Take prn for migraine po.  Do not exceed 2 a day. 07/07/23   Dohmeier, Porfirio Mylar, MD  sildenafil (REVATIO) 20 MG tablet Take 40 mg by mouth daily as needed (ED).    [provider]     The patient is critically ill due to status post mitral valve repair/acute respiratory insufficiency.  Critical care was necessary to treat or prevent imminent or life-threatening deterioration.  Critical care was time spent personally by me on the following activities: development of treatment plan with patient and/or surrogate as well as nursing, discussions with consultants, evaluation of patient's response to treatment, examination of patient, obtaining history from patient or surrogate, ordering and performing treatments and interventions, ordering and review of laboratory studies, ordering and review of radiographic studies, pulse oximetry, re-evaluation of patient's condition and participation in multidisciplinary rounds.   During this encounter critical care time was devoted to patient care services described in this note for 36 minutes.     Cheri Fowler, MD [facility] See Amion for pager If no response to pager, please call [phone] until 7pm After 7pm, Please call E-link [phone]

## 2023-08-29 NOTE — Discharge Instructions (Signed)
Discharge Instructions:  1. You may shower, please wash incisions daily with soap and water and keep dry.  If you wish to cover wounds with dressing you may do so but please keep clean and change daily.  No tub baths or swimming until incisions have completely healed.  If your incisions become red or develop any drainage please call our office at [phone]  2. No Driving until cleared by Dr. Weldner's office and you are no longer using narcotic pain medications  3. Monitor your weight daily.. Please use the same scale and weigh at same time... If you gain 5-10 lbs in 48 hours with associated lower extremity swelling, please contact our office at [phone]  4. Fever of 101.5 for at least 24 hours with no source, please contact our office at [phone]  5. Activity- up as tolerated, please walk at least 3 times per day.  Avoid strenuous activity, no lifting, pushing, or pulling with your arms over 8-10 lbs for a minimum of 6 weeks  6. If any questions or concerns arise, please do not hesitate to contact our office at [phone]  

## 2023-08-29 NOTE — Procedures (Signed)
Extubation Procedure Note  Patient Details:   Name: Christopher Weber DOB: 1946/08/04 MRN: [id number]   Airway Documentation:    Vent end date: 08/29/23 Vent end time: 1610   Evaluation  O2 sats: stable throughout Complications: No apparent complications Patient did tolerate procedure well. Bilateral Breath Sounds: Clear, Diminished   Yes  [name] 08/29/2023, 4:13 PM NIF- >-40cmH2O VC-1.0L Positive cuff leak Extubated to 4LPM [name]

## 2023-08-29 NOTE — Anesthesia Procedure Notes (Signed)
Central Venous Catheter Insertion Performed by: Achille Rich, MD, anesthesiologist Start/End12/16/2024 7:02 AM, 08/29/2023 7:12 AM Patient location: Pre-op. Preanesthetic checklist: patient identified, IV checked, site marked, risks and benefits discussed, surgical consent, monitors and equipment checked, pre-op evaluation, timeout performed and anesthesia consent Lidocaine 1% used for infiltration and patient sedated Hand hygiene performed  and maximum sterile barriers used  Catheter size: 8.5 Fr Sheath introducer Procedure performed using ultrasound guided technique. Ultrasound Notes:anatomy identified, needle tip was noted to be adjacent to the nerve/plexus identified, no ultrasound evidence of intravascular and/or intraneural injection and image(s) printed for medical record Attempts: 1 Following insertion, line sutured and dressing applied. Post procedure assessment: blood return through all ports, free fluid flow and no air  Patient tolerated the procedure well with no immediate complications.

## 2023-08-29 NOTE — Anesthesia Procedure Notes (Signed)
Central Venous Catheter Insertion Performed by: Achille Rich, MD, anesthesiologist Start/End12/16/2024 7:12 AM, 08/29/2023 7:14 AM Patient location: Pre-op. Preanesthetic checklist: patient identified, IV checked, site marked, risks and benefits discussed, surgical consent, monitors and equipment checked, pre-op evaluation, timeout performed and anesthesia consent Hand hygiene performed  and maximum sterile barriers used  PA cath was placed.Swan type:thermodilution Procedure performed without using ultrasound guided technique. Attempts: 1 Patient tolerated the procedure well with no immediate complications.

## 2023-08-29 NOTE — Op Note (Signed)
CARDIOVASCULAR SURGERY OPERATIVE NOTE  08/29/2023 Christopher Weber [id number]  Surgeon:  Ashley Akin, MD  First Assistant: Aloha Gell [facility]                               An experienced assistant was required given the complexity of this surgery and the standard of surgical care. The assistant was needed for exposure, dissection, suctioning, retraction of delicate tissues and sutures, instrument exchange and for overall help during this procedure.     Preoperative Diagnosis:  Severe Mitral Regurgitation from P1 prolapse                                             Atrial Fibrillation                                             Patent Foramen ovale  Postoperative Diagnosis:  Same   Procedure: Complex Mitral Valve repair with triangular resection of P1 segment and placement of a 36 mm Simulus Semi Rigid Band Full Left maze with Radiofrequency Pulmonary Vein Isolation; Coronary sinus, Mitral annular, Floor and Roof Cryoablations and    clipping of the Left atrial appendage with a 50mm medtronic clip,  Patent Foram Ovale closure   Anesthesia:  General Endotracheal   Clinical History/Surgical Indication:  77 yo male with NYHA class 1 symptoms of severe structural MR from posterior leaflet (P1) prolapse/flail with normal LV function complicated with paroxysmal afib. I believe he is a very good candidate for repair however slightly more challenging due to commissural nature of prolapse. With his age and complexity of repair and need to perform MAZE procedure with occlusion of LA appendage,   Findings: Heart with good LV function and severe MR prior to repair with trivial regurgitation post repair with NSR and occluded LA appendage and PFO  Preparation:  The patient was seen in the preoperative holding area and the correct patient, correct operation were confirmed with the patient after reviewing the medical record and catheterization. The consent was signed by me. Preoperative  antibiotics were given. A pulmonary arterial line and radial arterial line were placed by the anesthesia team. The patient was taken back to the operating room and positioned supine on the operating room table. After being placed under general endotracheal anesthesia by the anesthesia team a foley catheter was placed. The neck, chest, abdomen, and both legs were prepped with betadine soap and solution and draped in the usual sterile manner. A surgical time-out was taken and the correct patient and operative procedure were confirmed with the nursing and anesthesia staff.  Operation: A median sternotomy incision was then created and sternal above the sternal saw.  Pericardial well was developed and heparin was delivered.  The aorta was cannulated with a 20 [place] Starns aortic cannula and a 36 French straight cannulas placed in the right atrial appendage and directed toward the inferior vena cava with a 28 French right angle cannulas placed in the superior vena cava with adequate confirmation of anticoagulation cardiopulmonary bypass was instituted. At this time the right pulmonary veins were then isolated with the radiofrequency clamp and 3 sets of 2 burns were created. The antegrade cardioplegia catheter was placed in  the ascending aorta and the aortic cross-clamp was placed cold blood [place] cardioplegia was then delivered for total of 1300 cc.  An additional reanimation dose was delivered just prior to cross-clamp removal. The heart was retracted and the left pulmonary veins were isolated with the radiofrequency clamp for total of 3 sets of 2 burns.  The left atrial appendage was occluded with the 15 mm Medtronic clip. The left atrium was opened in the intra-atrial groove and excellent exposure of the mitral valve was obtained.  The valve was assessed and photographed.  Cryo lesion was then performed on the coronary sinus, the mitral annular lesion, and the roof and floor lesions. 3-0 Ethibond sutures  were then placed in the posterior annulus and the P1 segment which was severely prolapsing was resected in a triangular fashion.  The leaflets were reapproximated with a running 5-0 Prolene suture.  The valve was sized to a 36 mm stimulus semirigid band which was secured with the cor knot system. Saline load test revealed excellent coaptation of the leaflets and the left atrium was closed over ventricular sump. The patient in headdown position aortic cross-clamp was removed and ventricular and atrial pacing wires were placed and brought out through inferior stab wounds and secured. Patient following de-airing had the sump removed and was weaned from cardiopulmonary bypass on inotropic support.  With adequate hemodynamics and excellent valve function protamine was delivered and the patient was decannulated and sites oversewn were necessary.  Chest tubes were brought inferior stab wounds and secured and the sternum was reapproximated with interrupted stainless steel wire and the presternal subcutaneous tissue and skin were closed in multiple layers absorbable suture.  Sterile dressings were applied.

## 2023-08-29 NOTE — Hospital Course (Addendum)
History of Present Illness:     Mr. Lyga is a very active 77 year old who first noted atrial fibrillation 3 years ago and has been paroxysmal over this time with hardly any episodes the past several months. He has seen electrophysiology and was to have an ablation in January. However the patient also was noted over this time to have Mitral valve prolapse and this has progressed to severe mitral valve regurgitation with primarily P3 prolapse/flail. He has remained active and played several golf courses in [place] this past summer and had no issues walking the courses. He denies chest pain, lightheadedness or lower extremity edema. Recent TEE has confirmed that he has normal LV function and severe MR.  Dr. Leafy Ro reviewed the patient's diagnostic studies and determined he would benefit from surgical intervention. He reviewed the treatment options as well as the risks and benefits of surgery with the patient. Mr. Skufca was agreeable to proceed with surgery.  Hospital Course: Mr. Laughlin presented to [facility] and was brought to the operating room on 08/29/23. He underwent mitral valve repair utilizing a 34mm simulus annuloplasty band, MAZE, atrial appendage clipping utilizing a 50mm medtronic clip and closure of a patent foramen ovale. He tolerated the procedure well and was transferred to the SICU in stable condition. He was extubated the evening of surgery without complication. Drips were weaned as hemodynamics tolerated. Swan ganz catheter and arterial line were removed without complication. He had moderate drainage from his chest tubes so these were left in place. His epicardial pacing wires were removed and he was started on Eliquis. He had bibasilar atelectasis on CXR, pulmonary hygiene was encouraged and oxygen was weaned as tolerated. He had CKD stage 3a, creatinine remained near baseline.  His chest tubes were removed without difficulty.  He was started on Lasix to help facilitate diuresis.   He was felt stable for transfer to the progressive care unit on 09/01/2023.  The patient remains in NSR.  He is ambulating without difficulty.  He has diuresed well and will be transitioned to a prn regimen in a few days.  He surgical incisions are free from infection.  He is medically stable for discharge home today.

## 2023-08-29 NOTE — Transfer of Care (Signed)
Immediate Anesthesia Transfer of Care Note  Patient: Christopher Weber  Procedure(s) Performed: MITRAL VALVE REPAIR USING SIMULUS ANNULOPLASTY BAND (Chest) MAZE TRANSESOPHAGEAL ECHOCARDIOGRAM CLIPPING OF ATRIAL APPENDAGE USING MEDTRONIC 5OMM CLIP[ CLOSURE OF PATENT FORAMEN OVALE  Patient Location: ICU  Anesthesia Type:General  Level of Consciousness: sedated and Patient remains intubated per anesthesia plan  Airway & Oxygen Therapy: Patient remains intubated per anesthesia plan and Patient placed on Ventilator (see vital sign flow sheet for setting)  Post-op Assessment: Report given to RN and Post -op Vital signs reviewed and stable  Post vital signs: Reviewed and stable  Last Vitals:  Vitals Value Taken Time  BP 110/65 08/29/23 1151  Temp 36.2 C 08/29/23 1151  Pulse 80 08/29/23 1151  Resp 16 08/29/23 1151  SpO2 99 % 08/29/23 1151  Vitals shown include unfiled device data.  Last Pain:  Vitals:   08/29/23 0622  TempSrc: Oral  PainSc:          Complications: No notable events documented.

## 2023-08-30 ENCOUNTER — Encounter (HOSPITAL_COMMUNITY): Payer: Self-pay | Admitting: Thoracic Surgery (Cardiothoracic Vascular Surgery)

## 2023-08-30 ENCOUNTER — Inpatient Hospital Stay (HOSPITAL_COMMUNITY): Payer: Medicare Other

## 2023-08-30 ENCOUNTER — Other Ambulatory Visit: Payer: Self-pay | Admitting: Neurology

## 2023-08-30 ENCOUNTER — Other Ambulatory Visit: Payer: Self-pay

## 2023-08-30 DIAGNOSIS — Z9889 Other specified postprocedural states: Secondary | ICD-10-CM

## 2023-08-30 LAB — GLUCOSE, CAPILLARY
Glucose-Capillary: 109 mg/dL — ABNORMAL HIGH (ref 70–99)
Glucose-Capillary: 116 mg/dL — ABNORMAL HIGH (ref 70–99)
Glucose-Capillary: 135 mg/dL — ABNORMAL HIGH (ref 70–99)
Glucose-Capillary: 137 mg/dL — ABNORMAL HIGH (ref 70–99)
Glucose-Capillary: 139 mg/dL — ABNORMAL HIGH (ref 70–99)
Glucose-Capillary: 140 mg/dL — ABNORMAL HIGH (ref 70–99)
Glucose-Capillary: 141 mg/dL — ABNORMAL HIGH (ref 70–99)
Glucose-Capillary: 143 mg/dL — ABNORMAL HIGH (ref 70–99)
Glucose-Capillary: 144 mg/dL — ABNORMAL HIGH (ref 70–99)
Glucose-Capillary: 145 mg/dL — ABNORMAL HIGH (ref 70–99)
Glucose-Capillary: 149 mg/dL — ABNORMAL HIGH (ref 70–99)

## 2023-08-30 LAB — BASIC METABOLIC PANEL
Anion gap: 9 (ref 5–15)
Anion gap: 8 (ref 5–15)
BUN: 16 mg/dL (ref 8–23)
BUN: 19 mg/dL (ref 8–23)
CO2: 19 mmol/L — ABNORMAL LOW (ref 22–32)
CO2: 22 mmol/L (ref 22–32)
Calcium: 8.1 mg/dL — ABNORMAL LOW (ref 8.9–10.3)
Calcium: 8.3 mg/dL — ABNORMAL LOW (ref 8.9–10.3)
Chloride: 108 mmol/L (ref 98–111)
Chloride: 103 mmol/L (ref 98–111)
Creatinine, Ser: 1.08 mg/dL (ref 0.61–1.24)
Creatinine, Ser: 1.36 mg/dL — ABNORMAL HIGH (ref 0.61–1.24)
GFR, Estimated: >60 mL/min (ref >60)
GFR, Estimated: 54 mL/min — ABNORMAL LOW (ref >60)
Glucose, Bld: 138 mg/dL — ABNORMAL HIGH (ref 70–99)
Glucose, Bld: 128 mg/dL — ABNORMAL HIGH (ref 70–99)
Potassium: 4.4 mmol/L (ref 3.5–5.1)
Potassium: 4.1 mmol/L (ref 3.5–5.1)
Sodium: 136 mmol/L (ref 135–145)
Sodium: 133 mmol/L — ABNORMAL LOW (ref 135–145)

## 2023-08-30 LAB — CBC
HCT: 29.5 % — ABNORMAL LOW (ref 39.0–52.0)
HCT: 29.3 % — ABNORMAL LOW (ref 39.0–52.0)
Hemoglobin: 10.1 g/dL — ABNORMAL LOW (ref 13.0–17.0)
Hemoglobin: 9.9 g/dL — ABNORMAL LOW (ref 13.0–17.0)
MCH: 31 pg (ref 26.0–34.0)
MCH: 31.2 pg (ref 26.0–34.0)
MCHC: 34.2 g/dL (ref 30.0–36.0)
MCHC: 33.8 g/dL (ref 30.0–36.0)
MCV: 90.5 fL (ref 80.0–100.0)
MCV: 92.4 fL (ref 80.0–100.0)
Platelets: 134 10*3/uL — ABNORMAL LOW (ref 150–400)
Platelets: 122 10*3/uL — ABNORMAL LOW (ref 150–400)
RBC: 3.26 MIL/uL — ABNORMAL LOW (ref 4.22–5.81)
RBC: 3.17 MIL/uL — ABNORMAL LOW (ref 4.22–5.81)
RDW: 13.5 % (ref 11.5–15.5)
RDW: 14 % (ref 11.5–15.5)
WBC: 12.7 10*3/uL — ABNORMAL HIGH (ref 4.0–10.5)
WBC: 12.3 10*3/uL — ABNORMAL HIGH (ref 4.0–10.5)
nRBC: 0 % (ref 0.0–0.2)
nRBC: 0 % (ref 0.0–0.2)

## 2023-08-30 LAB — SURGICAL PATHOLOGY

## 2023-08-30 LAB — MAGNESIUM
Magnesium: 2.5 mg/dL — ABNORMAL HIGH (ref 1.7–2.4)
Magnesium: 2.4 mg/dL (ref 1.7–2.4)

## 2023-08-30 LAB — ECHO INTRAOPERATIVE TEE
Height: 67 in
Weight: 2608 [oz_av]

## 2023-08-30 MED ORDER — FUROSEMIDE 10 MG/ML IJ SOLN
40.0000 mg | Freq: Once | INTRAMUSCULAR | Status: AC
  Administered 2023-08-30: 40 mg via INTRAVENOUS
  Filled 2023-08-30: qty 4

## 2023-08-30 MED ORDER — INSULIN ASPART 100 UNIT/ML IJ SOLN
0.0000 [IU] | INTRAMUSCULAR | Status: DC
  Administered 2023-08-30 – 2023-08-31 (×6): 2 [IU] via SUBCUTANEOUS

## 2023-08-30 MED ORDER — ASPIRIN 81 MG PO CHEW
81.0000 mg | CHEWABLE_TABLET | Freq: Every day | ORAL | Status: DC
  Administered 2023-08-30 – 2023-09-01 (×3): 81 mg via ORAL
  Filled 2023-08-30 (×3): qty 1

## 2023-08-30 MED ORDER — SODIUM OXYBATE 500 MG/ML PO SOLN: ORAL | 3 refills | Status: DC

## 2023-08-30 MED ORDER — INSULIN ASPART 100 UNIT/ML IJ SOLN: 0.0000 [IU] | INTRAMUSCULAR | Status: DC

## 2023-08-30 MED ORDER — ENOXAPARIN SODIUM 40 MG/0.4ML IJ SOSY: 40.0000 mg | PREFILLED_SYRINGE | Freq: Every day | INTRAMUSCULAR | Status: DC

## 2023-08-30 NOTE — Discharge Summary (Signed)
[address]       [place] [address]             [phone]    Physician Discharge Summary  Patient ID: GUERIN VELARDE MRN: [id number] DOB/AGE: 75/22/1947 75 y.o.  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 04/12/2013   Discharge Diagnoses:  Patient Active Problem List   Diagnosis Date Noted   S/P MVR (mitral valve repair) 08/29/2023   Vestibular migraine 07/07/2023   Mitral valve insufficiency, acquired 11/06/2021   Paroxysmal atrial fibrillation (HCC) 06/18/2021   Benign fasciculations 02/10/2017   Narcolepsy 08/28/2015   Encounter for medication management 08/28/2015   Controlled narcolepsy 08/29/2014   Rotator cuff (capsule) sprain 08/29/2014   Migraine headache with aura 08/29/2014   Narcolepsy cataplexy syndrome 02/21/2014   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 Wicht is a very active 77 year old who first noted atrial fibrillation 3 years ago and has been paroxysmal over this time with hardly any episodes the past several months. He has seen electrophysiology and was to have an ablation in January. However the patient also was noted over this time to have Mitral valve prolapse and this has progressed to severe mitral valve regurgitation with primarily P3 prolapse/flail. He has remained active and played several golf courses in [place] this  past summer and had no issues walking the courses. He denies chest pain, lightheadedness or lower extremity edema. Recent TEE has confirmed that he has normal LV function and severe MR.  Dr. Leafy Ro reviewed the patient's diagnostic studies and determined he would benefit from surgical intervention. He reviewed the treatment options as well as the risks and benefits of surgery with the patient. Mr. Kamai was agreeable to proceed with surgery.  Hospital Course: Mr. Plymel presented to [facility] and was brought to the operating room on 08/29/23. He underwent mitral valve repair utilizing a 34mm simulus annuloplasty band, MAZE, atrial appendage clipping utilizing a 50mm medtronic clip and closure of a patent foramen ovale. He tolerated the procedure well and was transferred to the SICU in stable condition. He was extubated the evening of surgery without complication. Drips were weaned as hemodynamics tolerated. Swan ganz catheter and arterial line were removed without complication. He had moderate drainage from his chest tubes so these were left in place. His epicardial pacing wires were removed and he was started on Eliquis. He had bibasilar atelectasis on CXR, pulmonary hygiene was encouraged and oxygen was weaned as tolerated. He had CKD stage 3a, creatinine remained near baseline.  His chest tubes were removed without difficulty.  He was started on Lasix to help facilitate diuresis.  He was felt stable for transfer to the progressive care unit on 09/01/2023.  The patient remains in NSR.  He is ambulating without difficulty.  He has diuresed well and will be transitioned to a prn regimen in a few days.  He surgical incisions are free from infection.  He is medically stable for discharge home today.    Consults: pulmonary/intensive care  Significant Diagnostic Studies: TRANSESOPHOGEAL ECHO REPORT       Patient Name:   ZACKERIAH SHATZER Gude Date of Exam: 06/24/2023  Medical Rec #:  [id number]        Height:       68.0 in  Accession #:    [id number]      Weight:       162.0 lb  Date of Birth:  September 15, 1945      BSA:          1.869 m  Patient Age:    77 years        BP:           130/87 mmHg  Patient Gender: M               HR:           79 bpm.  Exam Location:  Inpatient   Procedure: Transesophageal Echo, 3D Echo, Color Doppler and Cardiac  Doppler   Indications:    Mitral Regurgitation i34.0    History:         Patient has prior history of Echocardiogram examinations,  most                  recent 05/26/2023. Arrythmias:Atrial Fibrillation.    Sonographer:     Irving Burton Senior RDCS  Referring Phys:  [id number] [facility]  Diagnosing Phys: Wilfred Lacy   PROCEDURE: After discussion of the risks and benefits of a TEE, an  informed consent was obtained from the patient. The transesophogeal probe  was passed without difficulty through the esophogus of the patient.  Sedation performed by different physician.  The patient was monitored while under deep sedation. Anesthestetic  sedation was provided intravenously by Anesthesiology: 140mg  of Propofol,  80mg  of Lidocaine. The patient developed no complications during the  procedure.    IMPRESSIONS     1. Left ventricular ejection fraction, by estimation, is 55 to 60%. The  left ventricle has normal function. The left ventricle has no regional  wall motion abnormalities.   2. Right ventricular systolic function is normal. The right ventricular  size is normal.   3. Left atrial size was mildly dilated. No left atrial/left atrial  appendage thrombus was detected.   4. There was a small PFO noted by bubble study.   5. Severe mitral regurgitation with a partial flail P1 scallop and a  highly eccentric jet of MR starting laterally and heading medially. PISA  ERO 0.44 cm^2, vena contracta area 0.36 cm^2. No pulmonary vein systolic  flow reversal but jet is highly  eccentric. No evidence of mitral stenosis.   6. The aortic valve is  tricuspid. Aortic valve regurgitation is trivial.  No aortic stenosis is present.   FINDINGS   Left Ventricle: Left ventricular ejection fraction, by estimation, is 55  to 60%. The left ventricle has normal function. The left ventricle has no  regional wall motion abnormalities. The left ventricular internal cavity  size was normal in size.   Right Ventricle: The right ventricular size is normal. No increase in  right ventricular wall thickness. Right ventricular systolic function is  normal.   Left Atrium: Left atrial size was mildly dilated. No left atrial/left  atrial appendage thrombus was detected.   Right Atrium: Right atrial size was normal in size.   Pericardium: There is no evidence of pericardial effusion.   Mitral Valve:  Severe mitral regurgitation with a partial flail P1 leaflet  and a highly eccentric jet of MR heading laterally to medially. The mitral  valve is abnormal. Severe mitral valve regurgitation. No evidence of  mitral valve stenosis.   Tricuspid Valve: The tricuspid valve is normal in structure. Tricuspid  valve regurgitation is not demonstrated.   Aortic Valve: The aortic valve is tricuspid. Aortic valve regurgitation is  trivial. No aortic stenosis is present.   Pulmonic Valve: The pulmonic valve was normal in structure. Pulmonic valve  regurgitation is trivial.   Aorta: The aortic root is normal in size and structure.   IAS/Shunts: There was a small PFO noted by bubble study.   Additional Comments: Spectral Doppler performed.   MR Peak grad:    111.9 mmHg  MR Mean grad:    58.0 mmHg  MR Vmax:         529.00 cm/s  MR Vmean:        356.0 cm/s  MR PISA:         6.28 cm  MR PISA Eff ROA: 44 mm  MR PISA Radius:  1.00 cm   Dalton McleanMD  Electronically signed by Wilfred Lacy  Signature Date/Time: 06/27/2023/1:16:15 PM        Final    RIGHT/LEFT HEART CATH AND CORONARY ANGIOGRAPHY   1. Minimal nonobstructive CAD.  2. Normal filling  pressure.  The PCWP tracing does not have a prominent v-wave.  3. Normal PA pressure.   Treatments: surgery:  08/29/2023 Franchot Erichsen [id number]   Surgeon:  Ashley Akin, MD   First Assistant: Aloha Gell [facility]                               An experienced assistant was required given the complexity of this surgery and the standard of surgical care. The assistant was needed for exposure, dissection, suctioning, retraction of delicate tissues and sutures, instrument exchange and for overall help during this procedure.       Preoperative Diagnosis:  Severe Mitral Regurgitation from P1 prolapse                                             Atrial Fibrillation                                             Patent Foramen ovale   Postoperative Diagnosis:  Same     Procedure: Complex Mitral Valve repair with triangular resection of P1 segment and placement of a 36 mm Simulus Semi Rigid Band Full Left maze with Radiofrequency Pulmonary Vein Isolation; Coronary sinus, Mitral annular, Floor and Roof Cryoablations and    clipping of the Left atrial appendage with a 50mm medtronic clip,  Patent Foram Ovale closure   Discharge Exam: Blood pressure 119/74, pulse 79, temperature 98.7 F (37.1 C), temperature source Oral, resp. rate 20, height 5\' 7"  (1.702 m), weight 78.5 kg, SpO2 98%.  General appearance: alert, cooperative, and no distress Neurologic: intact Heart: RRR, NSR on monitor with no significant arrhythmias Lungs: breath sounds full and clear. On RA with stable O2 sats.  Abdomen: soft, no tenderness Extremities: well perfused, no peripheral edema Wound: the sternotomy incision  is open to air and is intact / dry.   Discharge Medications:  The patient has been discharged on:   1.Beta Blocker:  Yes [  X ]                              No   [   ]                              If No, reason:  2.Ace Inhibitor/ARB: Yes [   ]                                     No  [  x  ]                                      If No, reason: labile BP  3.Statin:   Yes [   ]                  No  [ x  ]                  If No, reason: No CAD  4.Marlowe KaysValentino Hue  [  X ]                  No   [   ]                  If No, reason:  Patient had ACS upon admission: No  Plavix/P2Y12 inhibitor: Yes [   ]                                      No  [ x  ]      Allergies as of 09/02/2023       Reactions   Azithromycin Photosensitivity   Tetracyclines & Related    photosensitivity - can take medication but just must avoid being out in the sun    Doxycycline Photosensitivity   Dulcolax Stool Softener [dss] Nausea And Vomiting   Oxycodone Rash        Medication List     TAKE these medications    acetaminophen 325 MG tablet Commonly known as: TYLENOL Take 325 mg by mouth every 6 (six) hours as needed for moderate pain.   Align 4 MG Caps Take 4 mg by mouth 3 (three) times a week.   apixaban 5 MG Tabs tablet Commonly known as: Eliquis Take 1 tablet (5 mg total) by mouth 2 (two) times daily.   CALCIUM-MAGNESIUM-ZINC PO Take 1 tablet by mouth 3 (three) times a week.   fluticasone 50 MCG/ACT nasal spray Commonly known as: FLONASE Place 1 spray into both nostrils daily as needed for allergies or rhinitis.   furosemide 40 MG tablet Commonly known as: LASIX Take 1 tablet (40 mg total) by mouth daily. X 3 days, then decrease to 40 mg daily as needed for weight gain of 3-5 lbs in 24-48 hrs   metoprolol tartrate 25 MG tablet Commonly known as: LOPRESSOR Take 0.5 tablets (12.5 mg total) by mouth 2 (two) times daily. What changed:  how  much to take when to take this reasons to take this additional instructions   multivitamin capsule Take 1 capsule by mouth 3 (three) times a week.   multivitamin-lutein Caps capsule Take 1 capsule by mouth daily.   Nurtec 75 MG Tbdp Generic drug: Rimegepant Sulfate Take prn for migraine po.  Do not exceed 2 a day.   Omeprazole-Sodium  Bicarbonate 20-1100 MG Caps capsule Commonly known as: ZEGERID Take 1-2 capsules by mouth at bedtime.   pantoprazole 40 MG tablet Commonly known as: PROTONIX Take 20 mg by mouth daily.   polyethylene glycol 17 g packet Commonly known as: MIRALAX / GLYCOLAX Take 17 g by mouth See admin instructions. Take 17 g daily every night except skip dose on Sundays   potassium chloride SA 20 MEQ tablet Commonly known as: KLOR-CON M Take 1 tablet (20 mEq total) by mouth daily. X 3 days, then take daily as needed, only when you take Lasix   sildenafil 20 MG tablet Commonly known as: REVATIO Take 40 mg by mouth daily as needed (ED).   Sodium Oxybate 500 MG/ML Soln Take 2 grams at first dose and 2-3 hours later take 3 grams.   traMADol 50 MG tablet Commonly known as: ULTRAM Take 1 tablet (50 mg total) by mouth every 4 (four) hours as needed for moderate pain (pain score 4-6).   valACYclovir 1000 MG tablet Commonly known as: VALTREX Take 500 mg by mouth every Monday, Wednesday, and Friday.   VITAMIN D PO Take 1 tablet by mouth daily.   zaleplon 10 MG capsule Commonly known as: SONATA Take 10 mg by mouth as needed (when flying overseas).        Follow-up Information     [facility] Follow up on 09/15/2023.   Specialty: Cardiothoracic Surgery Why: Appointment is at 2:00 Contact information: [address] [place], [address] [place] [address] [phone]        [facility] Follow up on 09/15/2023.   Why: Please get CXR at 1:00 prior to your appointment at Dr. Karolee Ohs office Contact information: [address] [place] [address]                Signed:  Lowella Dandy, PA-C  09/02/2023, 7:56 AM

## 2023-08-30 NOTE — Plan of Care (Signed)
  Problem: Education: Goal: Knowledge of General Education information will improve Description: Including pain rating scale, medication(s)/side effects and non-pharmacologic comfort measures Outcome: Progressing   Problem: Health Behavior/Discharge Planning: Goal: Ability to manage health-related needs will improve Outcome: Progressing   Problem: Clinical Measurements: Goal: Ability to maintain clinical measurements within normal limits will improve Outcome: Progressing Goal: Will remain free from infection Outcome: Progressing Goal: Diagnostic test results will improve Outcome: Progressing Goal: Respiratory complications will improve Outcome: Progressing Goal: Cardiovascular complication will be avoided Outcome: Progressing   Problem: Activity: Goal: Risk for activity intolerance will decrease Outcome: Progressing   Problem: Nutrition: Goal: Adequate nutrition will be maintained Outcome: Progressing   Problem: Coping: Goal: Level of anxiety will decrease Outcome: Progressing   Problem: Elimination: Goal: Will not experience complications related to bowel motility Outcome: Progressing Goal: Will not experience complications related to urinary retention Outcome: Progressing   Problem: Pain Management: Goal: General experience of comfort will improve Outcome: Progressing   Problem: Safety: Goal: Ability to remain free from injury will improve Outcome: Progressing   Problem: Skin Integrity: Goal: Risk for impaired skin integrity will decrease Outcome: Progressing   Problem: Education: Goal: Will demonstrate proper wound care and an understanding of methods to prevent future damage Outcome: Progressing Goal: Knowledge of disease or condition will improve Outcome: Progressing Goal: Knowledge of the prescribed therapeutic regimen will improve Outcome: Progressing Goal: Individualized Educational Video(s) Outcome: Progressing   Problem: Activity: Goal: Risk for  activity intolerance will decrease Outcome: Progressing   Problem: Cardiac: Goal: Will achieve and/or maintain hemodynamic stability Outcome: Progressing   Problem: Clinical Measurements: Goal: Postoperative complications will be avoided or minimized Outcome: Progressing   Problem: Respiratory: Goal: Respiratory status will improve Outcome: Progressing   Problem: Skin Integrity: Goal: Wound healing without signs and symptoms of infection Outcome: Progressing Goal: Risk for impaired skin integrity will decrease Outcome: Progressing   Problem: Urinary Elimination: Goal: Ability to achieve and maintain adequate renal perfusion and functioning will improve Outcome: Progressing

## 2023-08-30 NOTE — Progress Notes (Signed)
[address]       [place] [address]             [phone]      1 Day Post-Op  Procedure(s) (LRB): MITRAL VALVE REPAIR USING SIMULUS ANNULOPLASTY BAND (N/A) MAZE (N/A) TRANSESOPHAGEAL ECHOCARDIOGRAM (N/A) CLIPPING OF ATRIAL APPENDAGE USING MEDTRONIC 5OMM CLIP[ CLOSURE OF PATENT FORAMEN OVALE   Total Length of Stay:  LOS: 1 day    SUBJECTIVE: Feels well No issues overnight  Vitals:   08/30/23 0645 08/30/23 0700  BP:  117/68  Pulse: 63 63  Resp: 14 16  Temp: 98.1 F (36.7 C) 97.9 F (36.6 C)  SpO2: 97% 97%    Intake/Output      12/16 0701 12/17 0700 12/17 0701 12/18 0700   P.O. 240    I.V. (mL/kg) 2707.4 (33.8)    Blood 500    NG/GT 200    IV Piggyback 2887.8    Total Intake(mL/kg) 6535.2 (81.5)    Urine (mL/kg/hr) 1787 (0.9)    Emesis/NG output 0    Blood 750    Chest Tube 870    Total Output 3407    Net +3128.2             sodium chloride 20 mL/hr at 08/30/23 0643   sodium chloride     sodium chloride 20 mL/hr at 08/30/23 0643    ceFAZolin (ANCEF) IV Stopped (08/30/23 0538)   dexmedetomidine (PRECEDEX) IV infusion Stopped (08/29/23 1450)   insulin 1.2 Units/hr (08/30/23 0643)   lactated ringers Stopped (08/29/23 1613)   lactated ringers 20 mL/hr at 08/30/23 0643   nitroGLYCERIN Stopped (08/29/23 1212)   phenylephrine (NEO-SYNEPHRINE) Adult infusion 20 mcg/min (08/30/23 0643)    CBC    Component Value Date/Time   WBC 12.7 (H) 08/30/2023 0308   RBC 3.26 (L) 08/30/2023 0308   HGB 10.1 (L) 08/30/2023 0308   HGB 14.5 06/18/2021 1002   HCT 29.5 (L) 08/30/2023 0308   HCT 41.7 06/18/2021 1002   PLT 134 (L) 08/30/2023 0308   PLT 285 06/18/2021 1002   MCV 90.5 08/30/2023 0308   MCV 89 06/18/2021 1002   MCH 31.0 08/30/2023 0308   MCHC 34.2 08/30/2023 0308   RDW 13.5 08/30/2023 0308   RDW 13.0 06/18/2021 1002   CMP     Component Value Date/Time   NA 136 08/30/2023 0308   NA 139 12/30/2022 1043   K 4.4  08/30/2023 0308   CL 108 08/30/2023 0308   CO2 19 (L) 08/30/2023 0308   GLUCOSE 138 (H) 08/30/2023 0308   BUN 16 08/30/2023 0308   BUN 16 12/30/2022 1043   CREATININE 1.08 08/30/2023 0308   CALCIUM 8.1 (L) 08/30/2023 0308   PROT 6.9 08/25/2023 1230   PROT 6.9 12/30/2022 1043   ALBUMIN 4.0 08/25/2023 1230   ALBUMIN 4.5 12/30/2022 1043   AST 27 08/25/2023 1230   ALT 22 08/25/2023 1230   ALKPHOS 38 08/25/2023 1230   BILITOT 0.8 08/25/2023 1230   BILITOT 0.5 12/30/2022 1043   GFRNONAA >60 08/30/2023 0308   GFRAA 57 (L) 06/05/2020 1511   ABG    Component Value Date/Time   PHART 7.318 (L) 08/29/2023 1713   PCO2ART 41.1 08/29/2023 1713   PO2ART 117 (H) 08/29/2023 1713   HCO3 21.0 08/29/2023 1713   TCO2 22 08/29/2023 1713   ACIDBASEDEF 5.0 (H) 08/29/2023 1713   O2SAT 98 08/29/2023 1713   CBG (last 3)  Recent Labs  08/30/23 0414 08/30/23 0513 08/30/23 0611  GLUCAP 116* 137* 139*     ASSESSMENT: POD #1 SP MV repair/Maze Doing well. Will wean neo for SBP >100  Dc art line and swan Diureses Ambulate Will start eliquis tomorrow after pacing wires and chest tubes out   Eugenio Hoes, MD 08/30/2023

## 2023-08-30 NOTE — TOC CM/SW Note (Signed)
Transition of Care [facility]) - Inpatient Brief Assessment   Patient Details  Name: Christopher Weber MRN: [id number] Date of Birth: Feb 04, 1946  Transition of [facility]) CM/SW Contact:    Gala Lewandowsky, RN Phone Number: 08/30/2023, 3:09 PM   Clinical Narrative: Patient post MVR. PTA patient was from home with family support. [facility] is following the patient for [facility] once stable. Case Manager will continue to follow for transition of care needs as the patient progresses.   Transition of Care Asessment: Insurance and Status: Insurance coverage has been reviewed Patient has primary care physician: Yes Prior/Current Home Services: No current home services Social Drivers of Health Review: SDOH reviewed no interventions necessary Readmission risk has been reviewed: Yes Transition of care needs: no transition of care needs at this time

## 2023-08-30 NOTE — Progress Notes (Signed)
NAME:  Christopher Weber, MRN:  [id number], DOB:  [birth year], LOS: 1 ADMISSION DATE:  08/29/2023, CONSULTATION DATE:  12/16 REFERRING MD:  Dr. Leafy Ro, CHIEF COMPLAINT:  Postop medical management    History of Present Illness:  Christopher Weber is a 77 year old male with an extensive past medical history significant for severe mitral valve regurgitation secondary to prolapse, paroxysmal atrial fibrillation anticoagulated with Eliquis, CKD, and arthritis who presented for elective mitral valve repair, maze procedure with pulmonary vein isolation, clipping of left atrial appendage and closure of foramen ovale.  Patient was transferred to ICU, PCCM was consulted for help evaluation and medical management  Pertinent  Medical History  Severe mitral valve regurgitation secondary to prolapse, paroxysmal atrial fibrillation anticoagulated with Eliquis, CKD, and arthritis  Significant Hospital Events: Including procedures, antibiotic start and stop dates in addition to other pertinent events   12/16 elective mitral valve repair with Dr. Leafy Ro 12/16 as above  Interim History / Subjective:  No overnight issues Patient was successfully extubated per rapid weaning protocol Was on low-dose phenylephrine which was discontinued this morning Stated pain is well-controlled  Objective   Blood pressure 99/62, pulse 61, temperature 97.9 F (36.6 C), resp. rate 20, height 5\' 7"  (1.702 m), weight 80.2 kg, SpO2 96%. PAP: (17-76)/(3-47) 40/14 CO:  [2.8 L/min-6.9 L/min] 4.6 L/min CI:  [1.5 L/min/m2-3.7 L/min/m2] 2.46 L/min/m2  Vent Mode: PSV;CPAP FiO2 (%):  [40 %-50 %] 40 % Set Rate:  [4 bmp-16 bmp] 4 bmp Vt Set:  [520 mL] 520 mL PEEP:  [5 cmH20] 5 cmH20 Pressure Support:  [5 cmH20] 5 cmH20   Intake/Output Summary (Last 24 hours) at 08/30/2023 1003 Last data filed at 08/30/2023 0800 Gross per 24 hour  Intake 5376.57 ml  Output 3097 ml  Net 2279.57 ml   Filed Weights   08/29/23 0622 08/30/23 0625   Weight: 73.9 kg 80.2 kg    Examination: General: Elderly male, sitting on recliner HEENT: [place]/AT, eyes anicteric.  moist mucus membranes Neuro: Alert, awake following commands Chest: Central sternotomy incision looks clean and dry, coarse breath sounds, no wheezes or rhonchi.  Mediastinal and chest tube in place Heart: Regular rate and rhythm, no murmurs or gallops Abdomen: Soft, nontender, nondistended, bowel sounds present Skin: No rash  Chest tube output was 870 cc  Labs and images reviewed  Resolved Hospital Problem list   Acute respiratory insufficiency, postop  Assessment & Plan:  Severe mitral regurgitation secondary to prolapse now mitral valve repair Paroxysmal atrial fibrillation anticoagulated with Eliquis S/p MAZE procedure with pulmonary vein isolation and clipping of left atrial appendage Patent foreman ovale status post closure Continuous telemetry Remained in sinus rhythm Chest tube output since coming to ICU was 870 cc Closely monitor Came off phenylephrine, SBP goal is 100 Continue amiodarone Continue aspirin Continue pain control with tramadol, oxycodone and morphine Encourage incentive spirometry and ambulation  Acute respiratory insufficiency, postop Patient was successfully extubated per rapid weaning protocol   Expected perioperative blood loss anemia Thrombocytopenia due to CPB Monitor H/H and PLT counts  CKD stage 3a Serum creatinine is at baseline Follow renal function  Monitor urine output Avoid nephrotoxins  Best Practice (right click and "Reselect all SmartList Selections" daily)   Diet/type: Regular consistency DVT prophylaxis: SCD GI prophylaxis: PPI Lines: Central line, Arterial Line, and yes and it is still needed Foley:  Yes, and it is still needed Code Status:  full code Last date of multidisciplinary goals of care discussion [Per primary team]  Labs   CBC: Recent Labs  Lab 08/25/23 1230 08/29/23 0757 08/29/23 1028  08/29/23 1049 08/29/23 1159 08/29/23 1600 08/29/23 1713 08/29/23 1757 08/30/23 0308  WBC 6.0  --   --   --  13.4*  --   --  13.7* 12.7*  HGB 14.9   < > 9.2*   < > 11.2* 10.2* 9.9* 10.9* 10.1*  HCT 43.5   < > 26.3*   < > 31.8* 30.0* 29.0* 31.0* 29.5*  MCV 89.5  --   --   --  88.8  --   --  88.8 90.5  PLT 251  --  137*  --  122*  --   --  133* 134*   < > = values in this interval not displayed.    Basic Metabolic Panel: Recent Labs  Lab 08/25/23 1230 08/29/23 0757 08/29/23 0852 08/29/23 0900 08/29/23 0945 08/29/23 1049 08/29/23 1052 08/29/23 1158 08/29/23 1600 08/29/23 1713 08/29/23 1757 08/30/23 0308  NA 137   < > 138   < > 137   < > 137 138 138 138 138 136  K 4.1   < > 4.0   < > 4.4   < > 4.2 3.8 4.6 4.5 4.3 4.4  CL 102   < > 102  --  102  --  105  --   --   --  109 108  CO2 27  --   --   --   --   --   --   --   --   --  21* 19*  GLUCOSE 98   < > 104*  --  145*  --  159*  --   --   --  128* 138*  BUN 13   < > 13  --  15  --  15  --   --   --  15 16  CREATININE 1.32*   < > 1.10  --  1.10  --  1.20  --   --   --  1.30* 1.08  CALCIUM 9.5  --   --   --   --   --   --   --   --   --  7.9* 8.1*  MG  --   --   --   --   --   --   --   --   --   --  3.0* 2.5*   < > = values in this interval not displayed.   GFR: Estimated Creatinine Clearance: 59.9 mL/min (by C-G formula based on SCr of 1.08 mg/dL). Recent Labs  Lab 08/25/23 1230 08/29/23 1159 08/29/23 1757 08/30/23 0308  WBC 6.0 13.4* 13.7* 12.7*    Liver Function Tests: Recent Labs  Lab 08/25/23 1230  AST 27  ALT 22  ALKPHOS 38  BILITOT 0.8  PROT 6.9  ALBUMIN 4.0   No results for input(s): "LIPASE", "AMYLASE" in the last 168 hours. No results for input(s): "AMMONIA" in the last 168 hours.  ABG    Component Value Date/Time   PHART 7.318 (L) 08/29/2023 1713   PCO2ART 41.1 08/29/2023 1713   PO2ART 117 (H) 08/29/2023 1713   HCO3 21.0 08/29/2023 1713   TCO2 22 08/29/2023 1713   ACIDBASEDEF 5.0 (H)  08/29/2023 1713   O2SAT 98 08/29/2023 1713     Coagulation Profile: Recent Labs  Lab 08/25/23 1230 08/29/23 1159  INR 1.0 1.4*    Cardiac Enzymes: No results for input(s): "CKTOTAL", "  CKMB", "CKMBINDEX", "TROPONINI" in the last 168 hours.  HbA1C: Hgb A1c MFr Bld  Date/Time Value Ref Range Status  08/25/2023 11:50 AM 5.3 4.8 - 5.6 % Final    Comment:    (NOTE) Pre diabetes:          5.7%-6.4%  Diabetes:              >6.4%  Glycemic control for   <7.0% adults with diabetes     CBG: Recent Labs  Lab 08/30/23 0208 08/30/23 0309 08/30/23 0414 08/30/23 0513 08/30/23 0611  GLUCAP 141* 145* 116* 137* 139*       Cheri Fowler, MD [facility] See Amion for pager If no response to pager, please call [phone] until 7pm After 7pm, Please call E-link [phone]

## 2023-08-30 NOTE — Anesthesia Postprocedure Evaluation (Signed)
Anesthesia Post Note  Patient: Christopher Weber  Procedure(s) Performed: MITRAL VALVE REPAIR USING SIMULUS ANNULOPLASTY BAND (Chest) MAZE TRANSESOPHAGEAL ECHOCARDIOGRAM CLIPPING OF ATRIAL APPENDAGE USING MEDTRONIC 5OMM CLIP[ CLOSURE OF PATENT FORAMEN OVALE     Patient location during evaluation: SICU Anesthesia Type: General Level of consciousness: sedated Pain management: pain level controlled Vital Signs Assessment: post-procedure vital signs reviewed and stable Respiratory status: patient remains intubated per anesthesia plan Cardiovascular status: stable Postop Assessment: no apparent nausea or vomiting Anesthetic complications: no   No notable events documented.  Last Vitals:  Vitals:   08/30/23 0645 08/30/23 0700  BP:  117/68  Pulse: 63 63  Resp: 14 16  Temp: 36.7 C 36.6 C  SpO2: 97% 97%    Last Pain:  Vitals:   08/30/23 0420  TempSrc:   PainSc: 4                  Parilee Hally S

## 2023-08-31 ENCOUNTER — Other Ambulatory Visit: Payer: Self-pay | Admitting: Cardiology

## 2023-08-31 ENCOUNTER — Inpatient Hospital Stay (HOSPITAL_COMMUNITY): Payer: Medicare Other

## 2023-08-31 DIAGNOSIS — Z9889 Other specified postprocedural states: Secondary | ICD-10-CM

## 2023-08-31 DIAGNOSIS — I340 Nonrheumatic mitral (valve) insufficiency: Secondary | ICD-10-CM

## 2023-08-31 LAB — BASIC METABOLIC PANEL
Anion gap: 9 (ref 5–15)
BUN: 22 mg/dL (ref 8–23)
CO2: 22 mmol/L (ref 22–32)
Calcium: 8.3 mg/dL — ABNORMAL LOW (ref 8.9–10.3)
Chloride: 100 mmol/L (ref 98–111)
Creatinine, Ser: 1.38 mg/dL — ABNORMAL HIGH (ref 0.61–1.24)
GFR, Estimated: 53 mL/min — ABNORMAL LOW (ref >60)
Glucose, Bld: 126 mg/dL — ABNORMAL HIGH (ref 70–99)
Potassium: 4.3 mmol/L (ref 3.5–5.1)
Sodium: 131 mmol/L — ABNORMAL LOW (ref 135–145)

## 2023-08-31 LAB — CBC
HCT: 27.5 % — ABNORMAL LOW (ref 39.0–52.0)
Hemoglobin: 9.4 g/dL — ABNORMAL LOW (ref 13.0–17.0)
MCH: 31.8 pg (ref 26.0–34.0)
MCHC: 34.2 g/dL (ref 30.0–36.0)
MCV: 92.9 fL (ref 80.0–100.0)
Platelets: 106 10*3/uL — ABNORMAL LOW (ref 150–400)
RBC: 2.96 MIL/uL — ABNORMAL LOW (ref 4.22–5.81)
RDW: 14 % (ref 11.5–15.5)
WBC: 11 10*3/uL — ABNORMAL HIGH (ref 4.0–10.5)
nRBC: 0 % (ref 0.0–0.2)

## 2023-08-31 LAB — GLUCOSE, CAPILLARY
Glucose-Capillary: 110 mg/dL — ABNORMAL HIGH (ref 70–99)
Glucose-Capillary: 121 mg/dL — ABNORMAL HIGH (ref 70–99)
Glucose-Capillary: 123 mg/dL — ABNORMAL HIGH (ref 70–99)
Glucose-Capillary: 126 mg/dL — ABNORMAL HIGH (ref 70–99)
Glucose-Capillary: 127 mg/dL — ABNORMAL HIGH (ref 70–99)
Glucose-Capillary: 137 mg/dL — ABNORMAL HIGH (ref 70–99)
Glucose-Capillary: 164 mg/dL — ABNORMAL HIGH (ref 70–99)

## 2023-08-31 MED ORDER — APIXABAN 5 MG PO TABS
5.0000 mg | ORAL_TABLET | Freq: Two times a day (BID) | ORAL | Status: DC
  Administered 2023-08-31 – 2023-09-02 (×4): 5 mg via ORAL
  Filled 2023-08-31 (×4): qty 1

## 2023-08-31 MED ORDER — FUROSEMIDE 40 MG PO TABS
40.0000 mg | ORAL_TABLET | Freq: Every day | ORAL | Status: DC
Start: 2023-08-31 — End: 2023-09-02
  Administered 2023-08-31 – 2023-09-02 (×3): 40 mg via ORAL
  Filled 2023-08-31 (×3): qty 1

## 2023-08-31 MED ORDER — INSULIN ASPART 100 UNIT/ML IJ SOLN
0.0000 [IU] | Freq: Three times a day (TID) | INTRAMUSCULAR | Status: DC
  Administered 2023-08-31: 2 [IU] via SUBCUTANEOUS
  Administered 2023-08-31: 3 [IU] via SUBCUTANEOUS
  Administered 2023-09-01: 2 [IU] via SUBCUTANEOUS

## 2023-08-31 MED ORDER — POTASSIUM CHLORIDE CRYS ER 20 MEQ PO TBCR
20.0000 meq | EXTENDED_RELEASE_TABLET | Freq: Every day | ORAL | Status: DC
  Administered 2023-08-31 – 2023-09-02 (×3): 20 meq via ORAL
  Filled 2023-08-31 (×3): qty 1

## 2023-08-31 MED FILL — Heparin Sodium (Porcine) Inj 1000 Unit/ML: Qty: 1000 | Status: AC

## 2023-08-31 MED FILL — Lidocaine HCl Local Preservative Free (PF) Inj 2%: INTRAMUSCULAR | Qty: 14 | Status: AC

## 2023-08-31 MED FILL — Potassium Chloride Inj 2 mEq/ML: INTRAVENOUS | Qty: 40 | Status: AC

## 2023-08-31 NOTE — Evaluation (Signed)
Physical Therapy Evaluation Patient Details Name: Christopher Weber MRN: [id number] DOB: 09/11/1946 Today's Date: 08/31/2023  History of Present Illness  Pt is 77 yo presenting to [facility] for elective mitral valve repair, maze procedure with pulmonary vein isolation, clipping of L atrial appendage and closure of foramen ovale. PMH: severe mitral valve regurgitation secondary to prolapse, paroxysmal a-fib, CKD, arthritis.  Clinical Impression  Pt is presenting below baseline level of functioning. Prior to hospitalization pt was ind with all activities and was active in the gym, working and driving. Currently pt is CGA - Min A for bed mobility, CGA for sit to stand and Supervision for gait with RW. Due to pt current functional status, home set up and available assistance at home no recommended skilled physical therapy services at this time on discharge from acute care hospital setting. Will continue to follow in acute setting in order to ensure that pt returns home with decreased risk for falls, injury, re-hospitalization and improved activity tolerance.          If plan is discharge home, recommend the following: Assistance with cooking/housework;Help with stairs or ramp for entrance;Assist for transportation     Equipment Recommendations None recommended by PT     Functional Status Assessment Patient has had a recent decline in their functional status and demonstrates the ability to make significant improvements in function in a reasonable and predictable amount of time.     Precautions / Restrictions Precautions Precautions: Sternal Precaution Comments: educated on sternal precautions during session talking about tube and within functional mobility Restrictions Weight Bearing Restrictions Per Provider Order: Yes RUE Weight Bearing Per Provider Order: Non weight bearing LUE Weight Bearing Per Provider Order: Non weight bearing      Mobility  Bed Mobility Overal bed mobility: Needs  Assistance Bed Mobility: Supine to Sit, Sit to Supine     Supine to sit: Contact guard Sit to supine: Min assist   General bed mobility comments: CGA with occasional verbal cues and Min a for LE for sitting to supine    Transfers Overall transfer level: Needs assistance Equipment used: Rolling walker (2 wheels) Transfers: Sit to/from Stand Sit to Stand: Contact guard assist           General transfer comment: CGA for safety. pt does well remembering to use heart pillow to prevent pushing up    Ambulation/Gait Ambulation/Gait assistance: Supervision Gait Distance (Feet): 150 Feet Assistive device: Rolling walker (2 wheels) Gait Pattern/deviations: Step-through pattern, Decreased stride length Gait velocity: slightly decreased Gait velocity interpretation: 1.31 - 2.62 ft/sec, indicative of limited community ambulator         Balance Overall balance assessment: Mild deficits observed, not formally tested         Pertinent Vitals/Pain Pain Assessment Pain Assessment: 0-10 Pain Score: 5  Pain Descriptors / Indicators: Aching Pain Intervention(s): Monitored during session    Home Living Family/patient expects to be discharged to:: Private residence Living Arrangements: Spouse/significant other Available Help at Discharge: Available PRN/intermittently;Available 24 hours/day Type of Home: House Home Access: Stairs to enter Entrance Stairs-Rails: Left Entrance Stairs-Number of Steps: 5-6, pt states that there are only 3 steps if he goes around the back. Alternate Level Stairs-Number of Steps: about 13 steps Home Layout: Two level;Able to live on main level with bedroom/bathroom Home Equipment: Grab bars - tub/shower;Crutches;BSC/3in1;Shower seat;Rolling Walker (2 wheels)      Prior Function Prior Level of Function : Independent/Modified Independent;Working/employed;Driving  Mobility Comments: Pt states that he was functional without an AD. ADLs  Comments: Pt was ind with all ADl's and IADL"s. Pt was still driving and working.     Extremity/Trunk Assessment   Upper Extremity Assessment Upper Extremity Assessment: Overall WFL for tasks assessed;RUE deficits/detail;LUE deficits/detail RUE Deficits / Details: sternal precautions LUE Deficits / Details: sternal precautions    Lower Extremity Assessment Lower Extremity Assessment: Overall WFL for tasks assessed    Cervical / Trunk Assessment Cervical / Trunk Assessment: Normal  Communication   Communication Communication: No apparent difficulties  Cognition Arousal: Alert Behavior During Therapy: WFL for tasks assessed/performed Overall Cognitive Status: Within Functional Limits for tasks assessed        General Comments General comments (skin integrity, edema, etc.): VS remained WNL throughout        Assessment/Plan    PT Assessment Patient needs continued PT services  PT Problem List Decreased activity tolerance;Decreased balance       PT Treatment Interventions DME instruction;Therapeutic exercise;Gait training;Balance training;Stair training;Functional mobility training;Therapeutic activities;Patient/family education    PT Goals (Current goals can be found in the Care Plan section)  Acute Rehab PT Goals Patient Stated Goal: to return home and get back to active lifestyle PT Goal Formulation: With patient Time For Goal Achievement: 09/14/23 Potential to Achieve Goals: Good    Frequency Min 1X/week        AM-PAC PT "6 Clicks" Mobility  Outcome Measure Help needed turning from your back to your side while in a flat bed without using bedrails?: A Little Help needed moving from lying on your back to sitting on the side of a flat bed without using bedrails?: A Little Help needed moving to and from a bed to a chair (including a wheelchair)?: A Little Help needed standing up from a chair using your arms (e.g., wheelchair or bedside chair)?: A Little Help  needed to walk in hospital room?: A Little Help needed climbing 3-5 steps with a railing? : A Little 6 Click Score: 18    End of Session Equipment Utilized During Treatment: Gait belt;Oxygen Activity Tolerance: Patient tolerated treatment well Patient left: in bed;with call bell/phone within reach;with family/visitor present Nurse Communication: Mobility status PT Visit Diagnosis: Other abnormalities of gait and mobility (R26.89)    Time: 4098-1191 PT Time Calculation (min) (ACUTE ONLY): 31 min   Charges:   PT Evaluation $PT Eval Low Complexity: 1 Low PT Treatments $Therapeutic Activity: 8-22 mins PT General Charges $$ ACUTE PT VISIT: 1 Visit        Harrel Carina, DPT, CLT  Acute Rehabilitation Services Office: [phone] (Secure chat preferred)   Claudia Desanctis 08/31/2023, 4:03 PM

## 2023-08-31 NOTE — Plan of Care (Signed)
  Problem: Education: Goal: Knowledge of General Education information will improve Description: Including pain rating scale, medication(s)/side effects and non-pharmacologic comfort measures Outcome: Progressing   Problem: Health Behavior/Discharge Planning: Goal: Ability to manage health-related needs will improve Outcome: Progressing   Problem: Clinical Measurements: Goal: Ability to maintain clinical measurements within normal limits will improve Outcome: Progressing Goal: Will remain free from infection Outcome: Progressing Goal: Diagnostic test results will improve Outcome: Progressing Goal: Respiratory complications will improve Outcome: Progressing Goal: Cardiovascular complication will be avoided Outcome: Progressing   Problem: Activity: Goal: Risk for activity intolerance will decrease Outcome: Progressing   Problem: Nutrition: Goal: Adequate nutrition will be maintained Outcome: Progressing   Problem: Coping: Goal: Level of anxiety will decrease Outcome: Progressing   Problem: Elimination: Goal: Will not experience complications related to bowel motility Outcome: Progressing Goal: Will not experience complications related to urinary retention Outcome: Progressing   Problem: Pain Management: Goal: General experience of comfort will improve Outcome: Progressing   Problem: Safety: Goal: Ability to remain free from injury will improve Outcome: Progressing   Problem: Skin Integrity: Goal: Risk for impaired skin integrity will decrease Outcome: Progressing   Problem: Education: Goal: Will demonstrate proper wound care and an understanding of methods to prevent future damage Outcome: Progressing Goal: Knowledge of disease or condition will improve Outcome: Progressing Goal: Knowledge of the prescribed therapeutic regimen will improve Outcome: Progressing Goal: Individualized Educational Video(s) Outcome: Progressing   Problem: Activity: Goal: Risk for  activity intolerance will decrease Outcome: Progressing   Problem: Cardiac: Goal: Will achieve and/or maintain hemodynamic stability Outcome: Progressing   Problem: Clinical Measurements: Goal: Postoperative complications will be avoided or minimized Outcome: Progressing   Problem: Respiratory: Goal: Respiratory status will improve Outcome: Progressing   Problem: Skin Integrity: Goal: Wound healing without signs and symptoms of infection Outcome: Progressing Goal: Risk for impaired skin integrity will decrease Outcome: Progressing   Problem: Urinary Elimination: Goal: Ability to achieve and maintain adequate renal perfusion and functioning will improve Outcome: Progressing

## 2023-08-31 NOTE — Progress Notes (Signed)
NAME:  Christopher Weber, MRN:  [id number], DOB:  [birth year], LOS: 2 ADMISSION DATE:  08/29/2023, CONSULTATION DATE:  12/16 REFERRING MD:  Dr. Leafy Ro, CHIEF COMPLAINT:  Postop medical management    History of Present Illness:  Christopher Weber is a 77 year old male with an extensive past medical history significant for severe mitral valve regurgitation secondary to prolapse, paroxysmal atrial fibrillation anticoagulated with Eliquis, CKD, and arthritis who presented for elective mitral valve repair, maze procedure with pulmonary vein isolation, clipping of left atrial appendage and closure of foramen ovale.  Patient was transferred to ICU, PCCM was consulted for help evaluation and medical management  Pertinent  Medical History  Severe mitral valve regurgitation secondary to prolapse, paroxysmal atrial fibrillation anticoagulated with Eliquis, CKD, and arthritis  Significant Hospital Events: Including procedures, antibiotic start and stop dates in addition to other pertinent events   12/16 elective mitral valve repair with Dr. Leafy Ro 12/16 as above  Interim History / Subjective:  CT outpt 746 last 24 hours Patient up in chair; states doing well and pain controlled  Objective   Blood pressure 120/65, pulse 65, temperature 98.2 F (36.8 C), temperature source Oral, resp. rate (!) 22, height 5\' 7"  (1.702 m), weight 81.1 kg, SpO2 95%.        Intake/Output Summary (Last 24 hours) at 08/31/2023 0714 Last data filed at 08/31/2023 0646 Gross per 24 hour  Intake 883.12 ml  Output 2165 ml  Net -1281.88 ml   Filed Weights   08/29/23 0622 08/30/23 0625 08/31/23 0646  Weight: 73.9 kg 80.2 kg 81.1 kg    Examination: General:  NAD HEENT: MM pink/moist; [place] in place Neuro: Aox3; MAE CV: s1s2, RRR, no m/r/g; CT in place PULM:  dim clear BS bilaterally; 2L [place] GI: soft, bsx4 active  Extremities: warm/dry, no edema  Skin: no rashes or lesions   Resolved Hospital Problem list   Acute  respiratory insufficiency, postop  Assessment & Plan:  Severe mitral regurgitation secondary to prolapse now mitral valve repair Paroxysmal atrial fibrillation anticoagulated with Eliquis S/p MAZE procedure with pulmonary vein isolation and clipping of left atrial appendage Patent foreman ovale status post closure Plan: - Postoperative care per TCTS - CT management per TCTS - can likely pull central line and foley - cont amio and metoprolol - cont eliquis - ASA and statin - pain management  Acute respiratory insufficiency, postop: extubated 12/16 Plan: -pulm toiletry -OOB -wean [place] for sats >92%  Expected perioperative blood loss anemia Thrombocytopenia due to CPB Plan: -trend cbc  CKD stage 3a Plan: -Trend BMP / urinary output -Replace electrolytes as indicated -Avoid nephrotoxic agents, ensure adequate renal perfusion     Best Practice (right click and "Reselect all SmartList Selections" daily)   Diet/type: Regular consistency DVT prophylaxis: DOAC GI prophylaxis: PPI Lines: likely pull CVL Foley:  likely can pull foley Code Status:  full code Last date of multidisciplinary goals of care discussion [Per primary team]   Labs   CBC: Recent Labs  Lab 08/29/23 1159 08/29/23 1600 08/29/23 1713 08/29/23 1757 08/30/23 0308 08/30/23 1744 08/31/23 0350  WBC 13.4*  --   --  13.7* 12.7* 12.3* 11.0*  HGB 11.2*   < > 9.9* 10.9* 10.1* 9.9* 9.4*  HCT 31.8*   < > 29.0* 31.0* 29.5* 29.3* 27.5*  MCV 88.8  --   --  88.8 90.5 92.4 92.9  PLT 122*  --   --  133* 134* 122* 106*   < > = values  in this interval not displayed.    Basic Metabolic Panel: Recent Labs  Lab 08/25/23 1230 08/29/23 0757 08/29/23 1052 08/29/23 1158 08/29/23 1713 08/29/23 1757 08/30/23 0308 08/30/23 1744 08/31/23 0350  NA 137   < > 137   < > 138 138 136 133* 131*  K 4.1   < > 4.2   < > 4.5 4.3 4.4 4.1 4.3  CL 102   < > 105  --   --  109 108 103 100  CO2 27  --   --   --   --  21* 19* 22  22  GLUCOSE 98   < > 159*  --   --  128* 138* 128* 126*  BUN 13   < > 15  --   --  15 16 19 22   CREATININE 1.32*   < > 1.20  --   --  1.30* 1.08 1.36* 1.38*  CALCIUM 9.5  --   --   --   --  7.9* 8.1* 8.3* 8.3*  MG  --   --   --   --   --  3.0* 2.5* 2.4  --    < > = values in this interval not displayed.   GFR: Estimated Creatinine Clearance: 47.2 mL/min (A) (by C-G formula based on SCr of 1.38 mg/dL (H)). Recent Labs  Lab 08/29/23 1757 08/30/23 0308 08/30/23 1744 08/31/23 0350  WBC 13.7* 12.7* 12.3* 11.0*    Liver Function Tests: Recent Labs  Lab 08/25/23 1230  AST 27  ALT 22  ALKPHOS 38  BILITOT 0.8  PROT 6.9  ALBUMIN 4.0   No results for input(s): "LIPASE", "AMYLASE" in the last 168 hours. No results for input(s): "AMMONIA" in the last 168 hours.  ABG    Component Value Date/Time   PHART 7.318 (L) 08/29/2023 1713   PCO2ART 41.1 08/29/2023 1713   PO2ART 117 (H) 08/29/2023 1713   HCO3 21.0 08/29/2023 1713   TCO2 22 08/29/2023 1713   ACIDBASEDEF 5.0 (H) 08/29/2023 1713   O2SAT 98 08/29/2023 1713     Coagulation Profile: Recent Labs  Lab 08/25/23 1230 08/29/23 1159  INR 1.0 1.4*    Cardiac Enzymes: No results for input(s): "CKTOTAL", "CKMB", "CKMBINDEX", "TROPONINI" in the last 168 hours.  HbA1C: Hgb A1c MFr Bld  Date/Time Value Ref Range Status  08/25/2023 11:50 AM 5.3 4.8 - 5.6 % Final    Comment:    (NOTE) Pre diabetes:          5.7%-6.4%  Diabetes:              >6.4%  Glycemic control for   <7.0% adults with diabetes     CBG: Recent Labs  Lab 08/30/23 0738 08/30/23 1158 08/30/23 2001 08/30/23 2337 08/31/23 0339  GLUCAP 144* 143* 140* 135* 126*       JD Daryel [facility] 08/31/2023, 8:28 AM  Please see [email or web address] for pager details.  From 7A-7P if no response, please call [phone]. After hours, please call ELink [phone].

## 2023-08-31 NOTE — Progress Notes (Addendum)
[address]       [place] [address]             [phone]      2 Days Post-Op Procedure(s) (LRB): MITRAL VALVE REPAIR USING SIMULUS ANNULOPLASTY BAND (N/A) MAZE (N/A) TRANSESOPHAGEAL ECHOCARDIOGRAM (N/A) CLIPPING OF ATRIAL APPENDAGE USING MEDTRONIC 5OMM CLIP[ CLOSURE OF PATENT FORAMEN OVALE Subjective: The patient states his pain is a 4. Sitting up in the chair and has no complaints this AM.  Objective: Vital signs in last 24 hours: Temp:  [97.8 F (36.6 C)-98.3 F (36.8 C)] 98.2 F (36.8 C) (12/17 2350) Pulse Rate:  [59-68] 65 (12/18 0700) Cardiac Rhythm: Normal sinus rhythm (12/18 0350) Resp:  [10-24] 22 (12/18 0700) BP: (96-129)/(59-76) 120/65 (12/18 0600) SpO2:  [94 %-100 %] 95 % (12/18 0700) Weight:  [81.1 kg] 81.1 kg (12/18 0646)  Hemodynamic parameters for last 24 hours:    Intake/Output from previous day: 12/17 0701 - 12/18 0700 In: 1183.1 [P.O.:730; I.V.:153.1; IV Piggyback:300] Out: 2265 [Urine:1425; Chest Tube:840] Intake/Output this shift: No intake/output data recorded.  General appearance: alert, cooperative, and no distress Neurologic: intact Heart: regular rate and rhythm, S1, S2 normal, no murmur, click, rub or gallop Lungs: slightly diminished bibasilar breath sounds Abdomen: soft, non-tender; bowel sounds normal; no masses,  no organomegaly Extremities: edema 1+ Wound: Clean and dry dressing in place  Lab Results: Recent Labs    08/30/23 1744 08/31/23 0350  WBC 12.3* 11.0*  HGB 9.9* 9.4*  HCT 29.3* 27.5*  PLT 122* 106*   BMET:  Recent Labs    08/30/23 1744 08/31/23 0350  NA 133* 131*  K 4.1 4.3  CL 103 100  CO2 22 22  GLUCOSE 128* 126*  BUN 19 22  CREATININE 1.36* 1.38*  CALCIUM 8.3* 8.3*    PT/INR:  Recent Labs    08/29/23 1159  LABPROT 17.5*  INR 1.4*   ABG    Component Value Date/Time   PHART 7.318 (L) 08/29/2023 1713   HCO3 21.0 08/29/2023 1713   TCO2 22 08/29/2023 1713    ACIDBASEDEF 5.0 (H) 08/29/2023 1713   O2SAT 98 08/29/2023 1713   CBG (last 3)  Recent Labs    08/30/23 2001 08/30/23 2337 08/31/23 0339  GLUCAP 140* 135* 126*    Assessment/Plan: S/P Procedure(s) (LRB): MITRAL VALVE REPAIR USING SIMULUS ANNULOPLASTY BAND (N/A) MAZE (N/A) TRANSESOPHAGEAL ECHOCARDIOGRAM (N/A) CLIPPING OF ATRIAL APPENDAGE USING MEDTRONIC 5OMM CLIP[ CLOSURE OF PATENT FORAMEN OVALE  Neuro: Pain controlled  CV: Off drips. On Lopressor 12.5mg  BID. On prophylactic Amiodarone. NSR, HR 60s. Will d/c EPW and start Eliquis later today.   Pulm: Saturating well on 2L [place]. CXR with bibasilar atelectasis. Encourage IS and ambulation. Wean oxygen as tolerated. CT output 840cc/24hrs. Leave in place.   GI: Tolerating a diet, no N/V. Passing gas. -BM.  Endo: No hx of DM. CBGs controlled on SSI. Will transition to AC/HS SSI.  Renal: CKD stage 3a, Cr 1.38. Stable and near baseline. UO 1425cc/24hrs. Wt +15lbs. Recieved IV Lasix yesterday, will start PO Lasix. Mild hyponatremia, Na 131. Monitor.   ID: Leukocytosis likely reactive, WBC 11. Trending down. Tmax 98.3. Monitor  Expected postop ABLA: H/H 9.4/27.5, not clinically significant at this time. Will monitor  Expected postop thrombocytopenia: Plt 106,000, trending down. Monitor  DVT Prophylaxis: No lovenox, possibly due to thrombocytopenia?   Dispo: Leave in ICU for now until chest tube output slows   LOS: 2 days  Jenny Reichmann, PA-C 08/31/2023

## 2023-09-01 ENCOUNTER — Encounter (HOSPITAL_COMMUNITY): Payer: BLUE CROSS/BLUE SHIELD | Admitting: Cardiology

## 2023-09-01 LAB — CBC
HCT: 26.3 % — ABNORMAL LOW (ref 39.0–52.0)
Hemoglobin: 9.1 g/dL — ABNORMAL LOW (ref 13.0–17.0)
MCH: 31.3 pg (ref 26.0–34.0)
MCHC: 34.6 g/dL (ref 30.0–36.0)
MCV: 90.4 fL (ref 80.0–100.0)
Platelets: 117 10*3/uL — ABNORMAL LOW (ref 150–400)
RBC: 2.91 MIL/uL — ABNORMAL LOW (ref 4.22–5.81)
RDW: 13.4 % (ref 11.5–15.5)
WBC: 10.7 10*3/uL — ABNORMAL HIGH (ref 4.0–10.5)
nRBC: 0 % (ref 0.0–0.2)

## 2023-09-01 LAB — BASIC METABOLIC PANEL
Anion gap: 7 (ref 5–15)
BUN: 20 mg/dL (ref 8–23)
CO2: 25 mmol/L (ref 22–32)
Calcium: 8.4 mg/dL — ABNORMAL LOW (ref 8.9–10.3)
Chloride: 98 mmol/L (ref 98–111)
Creatinine, Ser: 1.39 mg/dL — ABNORMAL HIGH (ref 0.61–1.24)
GFR, Estimated: 53 mL/min — ABNORMAL LOW (ref >60)
Glucose, Bld: 128 mg/dL — ABNORMAL HIGH (ref 70–99)
Potassium: 4.6 mmol/L (ref 3.5–5.1)
Sodium: 130 mmol/L — ABNORMAL LOW (ref 135–145)

## 2023-09-01 LAB — GLUCOSE, CAPILLARY
Glucose-Capillary: 128 mg/dL — ABNORMAL HIGH (ref 70–99)
Glucose-Capillary: 125 mg/dL — ABNORMAL HIGH (ref 70–99)
Glucose-Capillary: 102 mg/dL — ABNORMAL HIGH (ref 70–99)
Glucose-Capillary: 103 mg/dL — ABNORMAL HIGH (ref 70–99)
Glucose-Capillary: 136 mg/dL — ABNORMAL HIGH (ref 70–99)

## 2023-09-01 MED ORDER — SODIUM CHLORIDE 0.9 % IV SOLN: 250.0000 mL | INTRAVENOUS | Status: DC | PRN

## 2023-09-01 MED ORDER — MELATONIN 3 MG PO TABS
3.0000 mg | ORAL_TABLET | Freq: Every day | ORAL | Status: DC
  Administered 2023-09-01: 3 mg via ORAL
  Filled 2023-09-01: qty 1

## 2023-09-01 MED ORDER — ACETAMINOPHEN 325 MG PO TABS: 650.0000 mg | ORAL_TABLET | Freq: Four times a day (QID) | ORAL | Status: DC | PRN

## 2023-09-01 MED ORDER — ONDANSETRON HCL 4 MG/2ML IJ SOLN: 4.0000 mg | Freq: Four times a day (QID) | INTRAMUSCULAR | Status: DC | PRN

## 2023-09-01 MED ORDER — ONDANSETRON HCL 4 MG PO TABS: 4.0000 mg | ORAL_TABLET | Freq: Four times a day (QID) | ORAL | Status: DC | PRN

## 2023-09-01 MED ORDER — ~~LOC~~ CARDIAC SURGERY, PATIENT & FAMILY EDUCATION: Freq: Once | Status: AC

## 2023-09-01 MED ORDER — PANTOPRAZOLE SODIUM 40 MG PO TBEC
40.0000 mg | DELAYED_RELEASE_TABLET | Freq: Every day | ORAL | Status: DC
  Administered 2023-09-02: 40 mg via ORAL
  Filled 2023-09-01: qty 1

## 2023-09-01 MED ORDER — TRAMADOL HCL 50 MG PO TABS: 50.0000 mg | ORAL_TABLET | ORAL | Status: DC | PRN

## 2023-09-01 MED ORDER — MAGNESIUM HYDROXIDE 400 MG/5ML PO SUSP: 30.0000 mL | Freq: Every day | ORAL | Status: DC | PRN

## 2023-09-01 MED ORDER — SODIUM CHLORIDE 0.9% FLUSH
3.0000 mL | Freq: Two times a day (BID) | INTRAVENOUS | Status: DC
  Administered 2023-09-01 (×2): 3 mL via INTRAVENOUS

## 2023-09-01 MED ORDER — POLYETHYLENE GLYCOL 3350 17 G PO PACK: 17.0000 g | PACK | Freq: Every day | ORAL | Status: DC | PRN

## 2023-09-01 MED ORDER — PNEUMOCOCCAL 20-VAL CONJ VACC 0.5 ML IM SUSY
0.5000 mL | PREFILLED_SYRINGE | INTRAMUSCULAR | Status: DC
  Filled 2023-09-01: qty 0.5

## 2023-09-01 MED ORDER — METOPROLOL TARTRATE 12.5 MG HALF TABLET
12.5000 mg | ORAL_TABLET | Freq: Two times a day (BID) | ORAL | Status: DC
  Administered 2023-09-01 – 2023-09-02 (×3): 12.5 mg via ORAL
  Filled 2023-09-01 (×3): qty 1

## 2023-09-01 MED ORDER — SODIUM CHLORIDE 0.9% FLUSH: 3.0000 mL | INTRAVENOUS | Status: DC | PRN

## 2023-09-01 MED FILL — Mannitol IV Soln 20%: INTRAVENOUS | Qty: 500 | Status: AC

## 2023-09-01 MED FILL — Heparin Sodium (Porcine) Inj 1000 Unit/ML: INTRAMUSCULAR | Qty: 10 | Status: AC

## 2023-09-01 MED FILL — Sodium Bicarbonate IV Soln 8.4%: INTRAVENOUS | Qty: 50 | Status: AC

## 2023-09-01 MED FILL — Calcium Chloride Inj 10%: INTRAVENOUS | Qty: 10 | Status: AC

## 2023-09-01 MED FILL — Electrolyte-R (PH 7.4) Solution: INTRAVENOUS | Qty: 3000 | Status: AC

## 2023-09-01 MED FILL — Sodium Chloride IV Soln 0.9%: INTRAVENOUS | Qty: 2000 | Status: AC

## 2023-09-01 NOTE — Progress Notes (Signed)
Pt arrived from ..2H..., A/ox .4..pt denies any pain, MD aware,CCMD called. CHG bath given,no further needs at this time   

## 2023-09-01 NOTE — Progress Notes (Signed)
Report called to receiving RN, Will transfer via WC 4e04. PAtient with no complaints at the current time.

## 2023-09-01 NOTE — Plan of Care (Signed)
°  Problem: Clinical Measurements: Goal: Will remain free from infection Outcome: Progressing   Problem: Coping: Goal: Level of anxiety will decrease Outcome: Progressing   Problem: Elimination: Goal: Will not experience complications related to urinary retention Outcome: Progressing   Problem: Skin Integrity: Goal: Risk for impaired skin integrity will decrease Outcome: Progressing

## 2023-09-01 NOTE — Plan of Care (Signed)

## 2023-09-01 NOTE — Evaluation (Signed)
Occupational Therapy Evaluation/Discharge Patient Details Name: Christopher Weber MRN: [id number] DOB: [birth year] Today's Date: 09/01/2023   History of Present Illness Pt is 77 yo presenting to [facility] for elective mitral valve repair, maze procedure with pulmonary vein isolation, clipping of L atrial appendage and closure of foramen ovale. PMH: severe mitral valve regurgitation secondary to prolapse, paroxysmal a-fib, CKD, arthritis.   Clinical Impression   PTA, pt lives with wife, typically independent and active at baseline without AD. Pt works as a Therapist, sports. Pt presents now with minor deficits in cardiopulmonary endurance and dynamic standing balance but overall moving well. Educated re: sternal precautions for ADLs, bed mobility and gradual progression of endurance. Pt able to mobilize around unit with RW without physical assist and no assist needed for ADL tasks (aside from line mgmt). Wife present, supportive and able to provide assist if needed at home. Recommend continued mobility with nursing staff and mobility specialists when transferred out of ICU. No further skilled OT services needed at this time.  SpO2 > 96% on RA with activity (reports desats when resting) HR 60s, BP WFL       If plan is discharge home, recommend the following: A little help with bathing/dressing/bathroom;Assistance with cooking/housework;Assist for transportation    Functional Status Assessment  Patient has had a recent decline in their functional status and demonstrates the ability to make significant improvements in function in a reasonable and predictable amount of time.  Equipment Recommendations  None recommended by OT    Recommendations for Other Services       Precautions / Restrictions Precautions Precautions: Sternal Precaution Booklet Issued: Yes (comment) Restrictions Weight Bearing Restrictions Per Provider Order: No RUE Weight Bearing Per Provider Order: Non weight bearing LUE Weight  Bearing Per Provider Order: Non weight bearing      Mobility Bed Mobility Overal bed mobility: Needs Assistance Bed Mobility: Rolling, Supine to Sit Rolling: Modified independent (Device/Increase time)   Supine to sit: Supervision     General bed mobility comments: good adherence to precautions using pillow. verbally discussed returning to supine as pt/wife with questions    Transfers Overall transfer level: Needs assistance Equipment used: None Transfers: Sit to/from Stand Sit to Stand: Supervision           General transfer comment: able to stand without RW and use of heart pillow. provided RW upon standing for mobility      Balance Overall balance assessment: No apparent balance deficits (not formally assessed)                                         ADL either performed or assessed with clinical judgement   ADL Overall ADL's : Needs assistance/impaired Eating/Feeding: Independent   Grooming: Modified independent;Standing   Upper Body Bathing: With adaptive equipment;Adhering to UE precautions;Supervision/ safety   Lower Body Bathing: Modified independent   Upper Body Dressing : Adhering to UE precautions;Supervision/safety   Lower Body Dressing: Modified independent   Toilet Transfer: Supervision/safety;Ambulation;Rolling walker (2 wheels)   Toileting- Clothing Manipulation and Hygiene: Supervision/safety;Sit to/from stand;Sitting/lateral lean       Functional mobility during ADLs: Supervision/safety;Rolling walker (2 wheels) General ADL Comments: Education re: sternal precautions for UB/LB dressing, bathing techniques, bed mobility, continued walking/endurance retraining at DC. Encouraged obtaining a pulse ox for O2 monitoring at home during exertion and at rest (pt reports desats at rest during this admission)  Vision Ability to See in Adequate Light: 0 Adequate Patient Visual Report: No change from baseline Vision Assessment?: No  apparent visual deficits     Perception         Praxis         Pertinent Vitals/Pain Pain Assessment Pain Assessment: No/denies pain     Extremity/Trunk Assessment Upper Extremity Assessment Upper Extremity Assessment: Overall WFL for tasks assessed;Right hand dominant   Lower Extremity Assessment Lower Extremity Assessment: Defer to PT evaluation   Cervical / Trunk Assessment Cervical / Trunk Assessment: Normal   Communication Communication Communication: No apparent difficulties   Cognition Arousal: Alert Behavior During Therapy: WFL for tasks assessed/performed Overall Cognitive Status: Within Functional Limits for tasks assessed                                       General Comments  Wife at bedside    Exercises     Shoulder Instructions      Home Living Family/patient expects to be discharged to:: Private residence Living Arrangements: Spouse/significant other Available Help at Discharge: Available PRN/intermittently;Available 24 hours/day Type of Home: House Home Access: Stairs to enter [employer] of Steps: 5-6, pt states that there are only 3 steps if he goes around the back. Entrance Stairs-Rails: Left Home Layout: Two level;Able to live on main level with bedroom/bathroom Alternate Level Stairs-Number of Steps: about 13 steps Alternate Level Stairs-Rails: Right;Left Bathroom Shower/Tub: Walk-in shower;Tub/shower unit   Bathroom Toilet: Standard     Home Equipment: Grab bars - tub/shower;Crutches;BSC/3in1;Shower seat;Rolling Environmental consultant (2 wheels);Other (comment) (bedrails)          Prior Functioning/Environment Prior Level of Function : Independent/Modified Independent;Working/employed;Driving             Mobility Comments: Pt states that he was functional without an AD. ADLs Comments: Pt was ind with all ADLs and IADLs. Driving, working as a Therapist, sports. active - going to gym often        OT Problem List:  Decreased activity tolerance;Decreased knowledge of precautions;Cardiopulmonary status limiting activity      OT Treatment/Interventions:      OT Goals(Current goals can be found in the care plan section) Acute Rehab OT Goals Patient Stated Goal: wean off of O2, get back to normal activity OT Goal Formulation: All assessment and education complete, DC therapy  OT Frequency:      Co-evaluation              AM-PAC OT "6 Clicks" Daily Activity     Outcome Measure Help from another person eating meals?: None Help from another person taking care of personal grooming?: None Help from another person toileting, which includes using toliet, bedpan, or urinal?: A Little Help from another person bathing (including washing, rinsing, drying)?: A Little Help from another person to put on and taking off regular upper body clothing?: A Little Help from another person to put on and taking off regular lower body clothing?: None 6 Click Score: 21   End of Session Equipment Utilized During Treatment: Rolling walker (2 wheels);Gait belt Nurse Communication: Mobility status  Activity Tolerance: Patient tolerated treatment well Patient left: in chair;with call bell/phone within reach;with family/visitor present  OT Visit Diagnosis: Other (comment) (decreased cardiopulmonary endurance)                Time: 1610-9604 OT Time Calculation (min): 24 min Charges:  OT General Charges $OT  Visit: 1 Visit OT Evaluation $OT Eval Low Complexity: 1 Low OT Treatments $Self Care/Home Management : 8-22 mins  Christopher Weber, OTR/L Acute Rehab Services Office: [phone]   Christopher Weber 09/01/2023, 11:35 AM

## 2023-09-01 NOTE — Progress Notes (Signed)
TCTS DAILY ICU PROGRESS NOTE                   [address]            [place] [id number]          [phone]   3 Days Post-Op Procedure(s) (LRB): MITRAL VALVE REPAIR USING SIMULUS ANNULOPLASTY BAND (N/A) MAZE (N/A) TRANSESOPHAGEAL ECHOCARDIOGRAM (N/A) CLIPPING OF ATRIAL APPENDAGE USING MEDTRONIC 5OMM CLIP[ CLOSURE OF PATENT FORAMEN OVALE  Total Length of Stay:  LOS: 3 days   Subjective: Sitting up in bed reading a psychology journal.   No new concerns, walked in the hall 4 x yesterday and once this morning. Tolerating PO's and pain control adequate.  Passing gas, no BM yet.   Objective: Vital signs in last 24 hours: Temp:  [98.1 F (36.7 C)-99 F (37.2 C)] 98.2 F (36.8 C) (12/19 0007) Pulse Rate:  [64-75] 65 (12/19 0719) Cardiac Rhythm: Normal sinus rhythm (12/19 0755) Resp:  [17-32] 22 (12/19 0719) BP: (93-125)/(62-101) 111/77 (12/19 0719) SpO2:  [92 %-96 %] 93 % (12/19 0719) Weight:  [79.9 kg] 79.9 kg (12/19 0600)  Filed Weights   08/30/23 0625 08/31/23 0646 09/01/23 0600  Weight: 80.2 kg 81.1 kg 79.9 kg    Weight change: -1.2 kg     Intake/Output from previous day: 12/18 0701 - 12/19 0700 In: -  Out: 992 [Urine:800; Chest Tube:192]  Intake/Output this shift: Total I/O In: 240 [P.O.:240] Out: -   Current Meds: Scheduled Meds:  acetaminophen  1,000 mg Oral Q6H   Or   acetaminophen (TYLENOL) oral liquid 160 mg/5 mL  1,000 mg Per Tube Q6H   amiodarone  400 mg Oral BID   apixaban  5 mg Oral BID   aspirin  81 mg Oral Daily   Chlorhexidine Gluconate Cloth  6 each Topical Daily   docusate sodium  200 mg Oral Daily   furosemide  40 mg Oral Daily   insulin aspart  0-15 Units Subcutaneous TID WC   metoprolol tartrate  12.5 mg Oral BID   Or   metoprolol tartrate  12.5 mg Per Tube BID   pantoprazole  40 mg Oral Daily   potassium chloride  20 mEq Oral Daily   valACYclovir  500 mg Oral Q M,W,F   Continuous Infusions: PRN  Meds:.fluticasone, metoprolol tartrate, morphine injection, ondansetron (ZOFRAN) IV, mouth rinse, traMADol  General appearance: alert, cooperative, and no distress Neurologic: intact Heart: RRR, NSR on monitor with no significant arrhythmias Lungs: breath sounds full and clear. On RA with stable O2 sats.  Abdomen: soft, no tenderness Extremities: well perfused, no peripheral edema Wound: the sternotomy incision is open to air and is intact / dry.  Lab Results: CBC: Recent Labs    08/31/23 0350 09/01/23 0219  WBC 11.0* 10.7*  HGB 9.4* 9.1*  HCT 27.5* 26.3*  PLT 106* 117*   BMET:  Recent Labs    08/31/23 0350 09/01/23 0219  NA 131* 130*  K 4.3 4.6  CL 100 98  CO2 22 25  GLUCOSE 126* 128*  BUN 22 20  CREATININE 1.38* 1.39*  CALCIUM 8.3* 8.4*    CMET: Lab Results  Component Value Date   WBC 10.7 (H) 09/01/2023   HGB 9.1 (L) 09/01/2023   HCT 26.3 (L) 09/01/2023   PLT 117 (L) 09/01/2023   GLUCOSE 128 (H) 09/01/2023   CHOL 193 11/11/2022   TRIG 98 11/11/2022   HDL 52 11/11/2022  LDLCALC 121 (H) 11/11/2022   ALT 22 08/25/2023   AST 27 08/25/2023   NA 130 (L) 09/01/2023   K 4.6 09/01/2023   CL 98 09/01/2023   CREATININE 1.39 (H) 09/01/2023   BUN 20 09/01/2023   CO2 25 09/01/2023   INR 1.4 (H) 08/29/2023   HGBA1C 5.3 08/25/2023      PT/INR:  Recent Labs    08/29/23 1159  LABPROT 17.5*  INR 1.4*   Radiology: No results found.   Assessment/Plan: S/P Procedure(s) (LRB): MITRAL VALVE REPAIR USING SIMULUS ANNULOPLASTY BAND (N/A) MAZE (N/A) TRANSESOPHAGEAL ECHOCARDIOGRAM (N/A) CLIPPING OF ATRIAL APPENDAGE USING MEDTRONIC 5OMM CLIP[ CLOSURE OF PATENT FORAMEN OVALE  -POD3 MV repair and LAA clip for severe MR and h/o atrial fib. VS and cardiac rhythm stable on prophylactic amio loading. Apixaban resumed. Pacer wires are out.  Will remove the chest tube today. Transfer to [facility].  -PULM- currently on RA. Continue work on [employer].    -HEME- mild expected acute blood loss anemia and thrombocytopenia. Tolerating well, monitor.   -RENAL- Stage 3a CKD. Creat stable at baseline 1.3. Wt is ~6kg+. Diurese.   -GI- tolerating PO's. NO BM yet but abd is benign. Continue bowel regimen.   -DVT PPX- on apixaban, ambulating frequently.   -Disposition- transfer to 4E  today. Anticipate discharge to home in 1-2 days.     Leary Roca [phone] 09/01/2023 8:12 AM

## 2023-09-02 ENCOUNTER — Inpatient Hospital Stay (HOSPITAL_COMMUNITY): Payer: Medicare Other

## 2023-09-02 ENCOUNTER — Other Ambulatory Visit: Payer: Self-pay | Admitting: Physician Assistant

## 2023-09-02 LAB — CBC
HCT: 24.8 % — ABNORMAL LOW (ref 39.0–52.0)
Hemoglobin: 8.7 g/dL — ABNORMAL LOW (ref 13.0–17.0)
MCH: 31.4 pg (ref 26.0–34.0)
MCHC: 35.1 g/dL (ref 30.0–36.0)
MCV: 89.5 fL (ref 80.0–100.0)
Platelets: 150 10*3/uL (ref 150–400)
RBC: 2.77 MIL/uL — ABNORMAL LOW (ref 4.22–5.81)
RDW: 13.5 % (ref 11.5–15.5)
WBC: 8 10*3/uL (ref 4.0–10.5)
nRBC: 0 % (ref 0.0–0.2)

## 2023-09-02 LAB — BASIC METABOLIC PANEL
Anion gap: 6 (ref 5–15)
BUN: 21 mg/dL (ref 8–23)
CO2: 25 mmol/L (ref 22–32)
Calcium: 8.5 mg/dL — ABNORMAL LOW (ref 8.9–10.3)
Chloride: 98 mmol/L (ref 98–111)
Creatinine, Ser: 1.08 mg/dL (ref 0.61–1.24)
GFR, Estimated: >60 mL/min (ref >60)
Glucose, Bld: 119 mg/dL — ABNORMAL HIGH (ref 70–99)
Potassium: 4.5 mmol/L (ref 3.5–5.1)
Sodium: 129 mmol/L — ABNORMAL LOW (ref 135–145)

## 2023-09-02 LAB — GLUCOSE, CAPILLARY: Glucose-Capillary: 114 mg/dL — ABNORMAL HIGH (ref 70–99)

## 2023-09-02 MED ORDER — FUROSEMIDE 40 MG PO TABS: 40.0000 mg | ORAL_TABLET | Freq: Every day | ORAL | 1 refills | Status: DC

## 2023-09-02 MED ORDER — TRAMADOL HCL 50 MG PO TABS: 50.0000 mg | ORAL_TABLET | ORAL | 0 refills | Status: DC | PRN

## 2023-09-02 MED ORDER — POTASSIUM CHLORIDE CRYS ER 20 MEQ PO TBCR: 20.0000 meq | EXTENDED_RELEASE_TABLET | Freq: Every day | ORAL | 1 refills | Status: DC

## 2023-09-02 MED ORDER — METOPROLOL TARTRATE 25 MG PO TABS: 12.5000 mg | ORAL_TABLET | Freq: Two times a day (BID) | ORAL | 3 refills | Status: DC

## 2023-09-02 NOTE — Progress Notes (Signed)
CARDIAC REHAB PHASE I    Pt ready for discharge home today. Pt reports have all DME needed at home and ready for use. Post OHS education including site care, restrictions, heart healthy diet, sternal precautions, IS use at home, home needs at discharge, exercise guidelines and CRP2 reviewed. All questions and concerns addressed. Will refer to [facility] for CRP2.    [phone] Woodroe Chen, RN BSN 09/02/2023 9:29 AM

## 2023-09-02 NOTE — TOC Transition Note (Signed)
Transition of Care (TOC) - Discharge Note Donn Pierini RN, BSN Transitions of Care Unit 4E- RN Case Manager See Treatment Team for direct phone #  Patient Details  Name: [name] MRN: [id number] Date of Birth: [birth year]  Transition of Care [facility]) CM/SW Contact:  Darrold Span, RN Phone Number: 09/02/2023, 11:26 AM   Clinical Narrative:    Pt stable for transition home today, wife to transport home.  CM was notified by [facility] that they have TCTS office referral for [facility] needs- per review of chart no HH needs noted, discussed with attending PA provider during progression who voiced pt has no HH needs for discharge- [facility] liaison updated that pt has no HH needs.  No further TOC needs noted, Pt will follow up as per AVS instructions.    Final next level of care: Home/Self Care Barriers to Discharge: No Barriers Identified   Patient Goals and CMS Choice Patient states their goals for this hospitalization and ongoing recovery are:: return home and recover   Choice offered to / list presented to : NA      Discharge Placement               Home        Discharge Plan and Services Additional resources added to the After Visit Summary for   In-house Referral: NA Discharge Planning Services: NA Post Acute Care Choice: NA          DME Arranged: N/A DME Agency: NA       HH Arranged: NA HH Agency: [facility] ([facility]) Date HH Agency Contacted: 09/02/23 Time HH Agency Contacted: 1000 Representative spoke with at [facility] Agency: Herbert Seta  Social Drivers of Health (SDOH) Interventions SDOH Screenings   Food Insecurity: No Food Insecurity (08/30/2023)  Housing: Low Risk  (08/30/2023)  Transportation Needs: No Transportation Needs (08/30/2023)  Utilities: Not At Risk (08/30/2023)  Tobacco Use: Low Risk  (08/29/2023)     Readmission Risk Interventions    09/02/2023   11:26 AM  Readmission Risk Prevention Plan  Post Dischage Appt Complete   Medication Screening Complete  Transportation Screening Complete

## 2023-09-02 NOTE — Progress Notes (Signed)
Patient discharged to home, AVS reviewed including medications, follow up appointments, wound care and sternal precautions. Sutures and IV's removed; all sites clean dry and intact. Patient's wife to provide transportation.

## 2023-09-02 NOTE — Care Management Important Message (Signed)
Important Message  Patient Details  Name: Christopher Weber MRN: [id number] Date of Birth: Jan 26, 1946   Important Message Given:  Yes - Medicare IM     Renie Ora 09/02/2023, 9:32 AM

## 2023-09-04 ENCOUNTER — Encounter (HOSPITAL_COMMUNITY): Payer: Self-pay

## 2023-09-04 ENCOUNTER — Telehealth: Payer: Self-pay | Admitting: Thoracic Surgery (Cardiothoracic Vascular Surgery)

## 2023-09-04 NOTE — Telephone Encounter (Signed)
Dr Babs Sciara called to report swelling at top of sternal incision.  Mild erythema, soft, no drainage or excessive tenderness.  No fevers or chills  He will come by the office tomorrow to have wound checked.  If there are any concerns of progression he will go to ED to have wound checked  Viviann Spare C. Dorris Fetch, MD [facility] [phone]

## 2023-09-05 ENCOUNTER — Ambulatory Visit (INDEPENDENT_AMBULATORY_CARE_PROVIDER_SITE_OTHER): Payer: Self-pay | Admitting: Cardiothoracic Surgery

## 2023-09-05 ENCOUNTER — Encounter: Payer: Self-pay | Admitting: Cardiothoracic Surgery

## 2023-09-05 VITALS — BP 136/77 | HR 96 | Resp 20 | Wt 166.0 lb

## 2023-09-05 DIAGNOSIS — Z09 Encounter for follow-up examination after completed treatment for conditions other than malignant neoplasm: Secondary | ICD-10-CM | POA: Insufficient documentation

## 2023-09-05 DIAGNOSIS — I340 Nonrheumatic mitral (valve) insufficiency: Secondary | ICD-10-CM

## 2023-09-05 DIAGNOSIS — Z9889 Other specified postprocedural states: Secondary | ICD-10-CM

## 2023-09-05 NOTE — Progress Notes (Signed)
HPI: Patient presents for postop wound check after sternotomy for mitral valve repair and maze procedure by Dr. Leafy Ro 1 week ago.  The patient has been home 3 days.  He is feeling better, stronger, and is using his incentive spirometer and walking. Over the weekend he noticed some swelling at the upper part of the sternal incision and was concerned, so he presents today for evaluation.  There has been no drainage.  On exam the incision is healing well without erythema.  There is some slight tissue prominence at the superior aspect of the sternal incision which is commonly seen in the situation.  I reassured the patient there is no evidence of infection, antibiotics are not indicated, and he should continue to keep the wound clean and apply Betadine swab once a day.  Current Outpatient Medications  Medication Sig Dispense Refill   acetaminophen (TYLENOL) 325 MG tablet Take 325 mg by mouth every 6 (six) hours as needed for moderate pain.     apixaban (ELIQUIS) 5 MG TABS tablet Take 1 tablet (5 mg total) by mouth 2 (two) times daily. 180 tablet 3   CALCIUM-MAGNESIUM-ZINC PO Take 1 tablet by mouth 3 (three) times a week.     fluticasone (FLONASE) 50 MCG/ACT nasal spray Place 1 spray into both nostrils daily as needed for allergies or rhinitis.     furosemide (LASIX) 40 MG tablet Take 1 tablet (40 mg total) by mouth daily. X 3 days, then decrease to 40 mg daily as needed for weight gain of 3-5 lbs in 24-48 hrs 30 tablet 1   metoprolol tartrate (LOPRESSOR) 25 MG tablet Take 0.5 tablets (12.5 mg total) by mouth 2 (two) times daily. 30 tablet 3   Multiple Vitamin (MULTIVITAMIN) capsule Take 1 capsule by mouth 3 (three) times a week.     multivitamin-lutein (OCUVITE-LUTEIN) CAPS capsule Take 1 capsule by mouth daily.     Omeprazole-Sodium Bicarbonate (ZEGERID) 20-1100 MG CAPS capsule Take 1-2 capsules by mouth at bedtime.     pantoprazole (PROTONIX) 40 MG tablet Take 20 mg by mouth daily.     polyethylene  glycol (MIRALAX / GLYCOLAX) 17 g packet Take 17 g by mouth See admin instructions. Take 17 g daily every night except skip dose on Sundays     potassium chloride SA (KLOR-CON M) 20 MEQ tablet Take 1 tablet (20 mEq total) by mouth daily. X 3 days, then take daily as needed, only when you take Lasix 30 tablet 1   Probiotic Product (ALIGN) 4 MG CAPS Take 4 mg by mouth 3 (three) times a week.     Rimegepant Sulfate (NURTEC) 75 MG TBDP Take prn for migraine po.  Do not exceed 2 a day. 30 tablet 1   sildenafil (REVATIO) 20 MG tablet Take 40 mg by mouth daily as needed (ED).     Sodium Oxybate 500 MG/ML SOLN Take 2 grams at first dose and 2-3 hours later take 3 grams. 120 mL 3   traMADol (ULTRAM) 50 MG tablet Take 1 tablet (50 mg total) by mouth every 4 (four) hours as needed for moderate pain (pain score 4-6). 30 tablet 0   valACYclovir (VALTREX) 1000 MG tablet Take 500 mg by mouth every Monday, Wednesday, and Friday.     VITAMIN D PO Take 1 tablet by mouth daily.     zaleplon (SONATA) 10 MG capsule Take 10 mg by mouth as needed (when flying overseas).     No current facility-administered medications for this visit.  Physical Exam: Blood pressure 136/77, pulse 96, resp. rate 20, weight 166 lb (75.3 kg), SpO2 98%.   Patient is alert and comfortable Lungs are clear Sternal incision stable, dry and clean with good skin approximation and mild prominence of the upper portion of the skin closure.  There is no fluctuance under the skin or ecchymoses or hematoma. Heart rhythm is regular No cardiac murmur No peripheral edema   Diagnostic Tests: None  Impression: Sternal incision intact and healing well  Plan: Continue with sternal precautions Keep scheduled appointment at the [facility] clinic on January 2.   Lovett Sox, MD [facility] [phone]

## 2023-09-05 NOTE — Telephone Encounter (Signed)
Concern addressed by Christopher Weber to close encounter

## 2023-09-08 NOTE — Progress Notes (Signed)
 [address]       Christopher Weber [address]             [phone]    HPI: [name] is a 77 year old male with a past medical history of narcolepsy, laryngopharyngeal reflux, mitral regurgitation, mitral valve prolapse and paroxysmal atrial fibrillation. The patient returns for routine postoperative follow-up having undergone mitral valve repair utilizing a 34mm simulus annuloplasty band, MAZE, atrial appendage clipping utilizing a 50mm medtronic clip and closure of a patent foramen ovale by Dr. Maryjane on 08/29/23. The patient had a routine postoperative hospital stay.  He presented to our office on 12/23 due to concern of swelling at the superior portion of his sternal incision. Dr. Obadiah examined the area and felt this was a soft tissue prominence due to surgery and there was no sign of infection.  Since hospital discharge the patient reports his weight is down to baseline and has remained stable. The patient denies chest pain and is no longer taking pain medication. He has a good appetite and us  sleeping well. He denies palpitations since hospital discharge and his heart rate has remained in the 90s since stopping metoprolol  due to a syncopal episode. He has not ad a syncopal episode since but does complain of dizziness and vertigo at times. His biggest complaint is clearing his throat due to mucus that leads to a dry cough. He is taking dayquil and cough drops and takes PPIs daily. He states this happens yearly and his PCP gives him azithromycin or doxycycline and it is the only thing that helps. He denies shortness of breath, fever or chills.   Current Outpatient Medications  Medication Sig Dispense Refill   acetaminophen  (TYLENOL ) 325 MG tablet Take 325 mg by mouth every 6 (six) hours as needed for moderate pain.     apixaban  (ELIQUIS ) 5 MG TABS tablet Take 1 tablet (5 mg total) by mouth 2 (two) times daily. 180 tablet 3   CALCIUM -MAGNESIUM -ZINC PO Take 1 tablet by  mouth 3 (three) times a week.     fluticasone  (FLONASE ) 50 MCG/ACT nasal spray Place 1 spray into both nostrils daily as needed for allergies or rhinitis.     furosemide  (LASIX ) 40 MG tablet Take 1 tablet (40 mg total) by mouth daily. X 3 days, then decrease to 40 mg daily as needed for weight gain of 3-5 lbs in 24-48 hrs 30 tablet 1   metoprolol  tartrate (LOPRESSOR ) 25 MG tablet Take 0.5 tablets (12.5 mg total) by mouth 2 (two) times daily. 30 tablet 3   Multiple Vitamin (MULTIVITAMIN) capsule Take 1 capsule by mouth 3 (three) times a week.     multivitamin-lutein (OCUVITE-LUTEIN) CAPS capsule Take 1 capsule by mouth daily.     Omeprazole-Sodium Bicarbonate  (ZEGERID) 20-1100 MG CAPS capsule Take 1-2 capsules by mouth at bedtime.     pantoprazole  (PROTONIX ) 40 MG tablet Take 20 mg by mouth daily.     polyethylene glycol (MIRALAX  / GLYCOLAX ) 17 g packet Take 17 g by mouth See admin instructions. Take 17 g daily every night except skip dose on Sundays     potassium chloride  SA (KLOR-CON  M) 20 MEQ tablet Take 1 tablet (20 mEq total) by mouth daily. X 3 days, then take daily as needed, only when you take Lasix  30 tablet 1   Probiotic Product (ALIGN) 4 MG CAPS Take 4 mg by mouth 3 (three) times a week.     Rimegepant Sulfate  (NURTEC) 75  MG TBDP Take prn for migraine po.  Do not exceed 2 a day. 30 tablet 1   sildenafil (REVATIO) 20 MG tablet Take 40 mg by mouth daily as needed (ED).     Sodium Oxybate  500 MG/ML SOLN Take 2 grams at first dose and 2-3 hours later take 3 grams. 120 mL 3   traMADol  (ULTRAM ) 50 MG tablet Take 1 tablet (50 mg total) by mouth every 4 (four) hours as needed for moderate pain (pain score 4-6). 30 tablet 0   valACYclovir  (VALTREX ) 1000 MG tablet Take 500 mg by mouth every Monday, Wednesday, and Friday.     VITAMIN D  PO Take 1 tablet by mouth daily.     zaleplon  (SONATA ) 10 MG capsule Take 10 mg by mouth as needed (when flying overseas).     No current facility-administered  medications for this visit.   Vitals: Today's Vitals   09/15/23 1431  BP: 122/76  Pulse: 99  Resp: 20  SpO2: 99%  Weight: 163 lb (73.9 kg)   Body mass index is 25.53 kg/m.  Physical Exam: General: Alert and oriented, no acute distress Neuro: Grossly intact CV: Regular rate and rhythm, no murmur Pulm: Clear to auscultation bilaterally GI: +BS, nontender, no distension Extremities: No edema Wound: Clean and dry without sign of infection  Diagnostic Tests: CLINICAL DATA:  Status post mitral valve repair.  Cough for 3 days.   EXAM: CHEST - 2 VIEW   COMPARISON:  September 02, 2023.   FINDINGS: Stable cardiomediastinal silhouette. Sternotomy wires are noted. Minimal bilateral pleural effusions are noted. No significant consolidative process is noted. Bony thorax is unremarkable.   IMPRESSION: Minimal bilateral pleural effusions.     Electronically Signed   By: Lynwood Landy Raddle M.D.   On: 09/12/2023 10:36  Impression/Plan: S/P mitral valve repair: The patient seems to be progressing well from surgery. He no longer requires pain medication. He denies palpitations and does not feel he has had any afib since discharge from the hospital. He has stopped taking metoprolol  since he had a syncopal episode on christmas day and his blood pressure was soft. He does take 1/2 tablet of lopressor  if his heart rate gets over 100 and states this resolved quickly and he did not feel like he was in afib. He is ambulating 15-20 minutes per day without difficulty. His incisions are healing well without sign of infection. He does complain of a dry cough due to mucus lining the back of his throat and he is continuously clearing his throat throughout the visit. CXR shows minimal bilateral pleural effusions and he is under his preoperative weight without lower extremity edema. The patient states he has tried cough drops, dayquil and mucinex and is on antireflux medications. I told him to continue  dayquil daily and mucinex since that seems to be helping as well as cough drops as needed. He does admit that this occurs annually around this time and the only thing that helps is Azithromycin or Doxycycline that his PCP prescribes. Since he has no fever or chills and CXR shows no sign of infection I advised he follow up with his PCP since I do not see an indication for antibiotics at this time. His dizziness has improved but he did develop a vertigo episode, I recommend he follow up with his PCP regarding this. Will not make any medication changes at this time. We reviewed continued sternal precautions and endocarditis prophylaxis. The patient follows up with cardiology next week. Plan to  have the patient return to the clinic in 3 weeks with Dr. Maryjane.    Continue to avoid any heavy lifting or strenuous use of your arms or shoulders for at least a total of three months from the time of surgery.  After three months you may gradually increase how much you lift or otherwise use your arms or chest as tolerated, with limits based upon whether or not activities lead to the return of significant discomfort.  Con JAYSON Helm, PA-C [facility] [phone]

## 2023-09-09 ENCOUNTER — Other Ambulatory Visit: Payer: Self-pay | Admitting: Thoracic Surgery (Cardiothoracic Vascular Surgery)

## 2023-09-09 DIAGNOSIS — Z9889 Other specified postprocedural states: Secondary | ICD-10-CM

## 2023-09-12 ENCOUNTER — Other Ambulatory Visit: Payer: Self-pay | Admitting: Thoracic Surgery (Cardiothoracic Vascular Surgery)

## 2023-09-12 ENCOUNTER — Ambulatory Visit
Admission: RE | Admit: 2023-09-12 | Discharge: 2023-09-12 | Disposition: A | Discharge status: Home / Self Care | Payer: Medicare Other | Source: Ambulatory Visit | Attending: Thoracic Surgery (Cardiothoracic Vascular Surgery)

## 2023-09-12 DIAGNOSIS — Z9889 Other specified postprocedural states: Secondary | ICD-10-CM

## 2023-09-12 DIAGNOSIS — J90 Pleural effusion, not elsewhere classified: Secondary | ICD-10-CM | POA: Diagnosis not present

## 2023-09-15 ENCOUNTER — Encounter: Payer: Self-pay | Admitting: Physician Assistant

## 2023-09-15 ENCOUNTER — Other Ambulatory Visit: Payer: BLUE CROSS/BLUE SHIELD

## 2023-09-15 ENCOUNTER — Ambulatory Visit (INDEPENDENT_AMBULATORY_CARE_PROVIDER_SITE_OTHER): Payer: Self-pay | Admitting: Physician Assistant

## 2023-09-15 VITALS — BP 122/76 | HR 99 | Resp 20 | Wt 163.0 lb

## 2023-09-15 DIAGNOSIS — Z9889 Other specified postprocedural states: Secondary | ICD-10-CM

## 2023-09-15 DIAGNOSIS — I340 Nonrheumatic mitral (valve) insufficiency: Secondary | ICD-10-CM

## 2023-09-15 NOTE — Patient Instructions (Addendum)
 Continue to avoid any heavy lifting or strenuous use of your arms or shoulders for at least a total of three months from the time of surgery.  After three months you may gradually increase how much you lift or otherwise use your arms or chest as tolerated, with limits based upon whether or not activities lead to the return of significant discomfort.  Endocarditis is a potentially serious infection of heart valves or inside lining of the heart.  It occurs more commonly in patients with diseased heart valves (such as patient's with aortic or mitral valve disease) and in patients who have undergone heart valve repair or replacement.  Certain surgical and dental procedures may put you at risk, such as dental cleaning, other dental procedures, or any surgery involving the respiratory, urinary, gastrointestinal tract, gallbladder or prostate gland.   To minimize your chances for develooping endocarditis, maintain good oral health and seek prompt medical attention for any infections involving the mouth, teeth, gums, skin or urinary tract.    Always notify your doctor or dentist about your underlying heart valve condition before having any invasive procedures. You will need to take antibiotics before certain procedures, including all routine dental cleanings or other dental procedures.  Your cardiologist or dentist should prescribe these antibiotics for you to be taken ahead of time.  Follow up with your PCP for vertigo and cough

## 2023-09-19 ENCOUNTER — Other Ambulatory Visit: Payer: Self-pay | Admitting: Neurology

## 2023-09-19 ENCOUNTER — Telehealth: Payer: Self-pay | Admitting: Neurology

## 2023-09-19 ENCOUNTER — Encounter: Payer: Self-pay | Admitting: Thoracic Surgery (Cardiothoracic Vascular Surgery)

## 2023-09-19 ENCOUNTER — Encounter: Payer: Self-pay | Admitting: Neurology

## 2023-09-19 DIAGNOSIS — G43109 Migraine with aura, not intractable, without status migrainosus: Secondary | ICD-10-CM

## 2023-09-19 DIAGNOSIS — I480 Paroxysmal atrial fibrillation: Secondary | ICD-10-CM

## 2023-09-19 DIAGNOSIS — G43809 Other migraine, not intractable, without status migrainosus: Secondary | ICD-10-CM

## 2023-09-19 DIAGNOSIS — I340 Nonrheumatic mitral (valve) insufficiency: Secondary | ICD-10-CM

## 2023-09-19 DIAGNOSIS — G441 Vascular headache, not elsewhere classified: Secondary | ICD-10-CM

## 2023-09-19 MED ORDER — EMGALITY 120 MG/ML ~~LOC~~ SOSY: 120.0000 mg | PREFILLED_SYRINGE | SUBCUTANEOUS | 5 refills | Status: DC

## 2023-09-19 MED ORDER — EMGALITY 120 MG/ML ~~LOC~~ SOAJ: 120.0000 mg | SUBCUTANEOUS | Status: DC

## 2023-09-19 NOTE — Progress Notes (Signed)
 Emgality ordered for vestibular migraines.atrial fib, MV repair. Vertigo.

## 2023-09-19 NOTE — Telephone Encounter (Signed)
 The mychart message has been routed to Dr Vickey Huger for her to address.

## 2023-09-19 NOTE — Addendum Note (Signed)
 Addended by: Judi Cong on: 09/19/2023 04:58 PM   Modules accepted: Orders

## 2023-09-19 NOTE — Telephone Encounter (Signed)
 Pt asking for nurse to check MyChart message sent today and would like a message response.

## 2023-09-20 ENCOUNTER — Telehealth: Payer: Self-pay | Admitting: Neurology

## 2023-09-20 NOTE — Telephone Encounter (Signed)
 [employer] sup/medicare NPR sent to [employer] [phone]

## 2023-09-20 NOTE — Telephone Encounter (Signed)
 noted

## 2023-09-20 NOTE — Telephone Encounter (Signed)
 Brianna with [facility] calling to verify the issue date on last Rx sent for Sodium Oxybate 500 MG/ML SOLN

## 2023-09-21 MED ORDER — NURTEC 75 MG PO TBDP: 75.0000 mg | ORAL_TABLET | ORAL | 0 refills | Status: DC | PRN

## 2023-09-21 NOTE — Addendum Note (Signed)
 Addended by: Judi Cong on: 09/21/2023 08:45 AM   Modules accepted: Orders

## 2023-09-21 NOTE — Telephone Encounter (Signed)
 [facility] has been verify patient Rx Sodium Oxybate 500 MG/ML SOLN on 08/30/23.

## 2023-09-22 DIAGNOSIS — H2513 Age-related nuclear cataract, bilateral: Secondary | ICD-10-CM | POA: Diagnosis not present

## 2023-09-22 DIAGNOSIS — H52203 Unspecified astigmatism, bilateral: Secondary | ICD-10-CM | POA: Diagnosis not present

## 2023-09-22 DIAGNOSIS — H35371 Puckering of macula, right eye: Secondary | ICD-10-CM | POA: Diagnosis not present

## 2023-09-26 ENCOUNTER — Ambulatory Visit (HOSPITAL_COMMUNITY)
Admission: RE | Admit: 2023-09-26 | Discharge: 2023-09-26 | Disposition: A | Discharge status: Home / Self Care | Payer: Medicare Other | Source: Ambulatory Visit | Attending: Neurology | Admitting: Neurology

## 2023-09-26 DIAGNOSIS — G43809 Other migraine, not intractable, without status migrainosus: Secondary | ICD-10-CM | POA: Insufficient documentation

## 2023-09-26 DIAGNOSIS — I6782 Cerebral ischemia: Secondary | ICD-10-CM | POA: Diagnosis not present

## 2023-09-26 DIAGNOSIS — I340 Nonrheumatic mitral (valve) insufficiency: Secondary | ICD-10-CM | POA: Diagnosis not present

## 2023-09-26 DIAGNOSIS — G932 Benign intracranial hypertension: Secondary | ICD-10-CM | POA: Diagnosis not present

## 2023-09-26 DIAGNOSIS — G43109 Migraine with aura, not intractable, without status migrainosus: Secondary | ICD-10-CM | POA: Insufficient documentation

## 2023-09-26 DIAGNOSIS — G441 Vascular headache, not elsewhere classified: Secondary | ICD-10-CM | POA: Diagnosis not present

## 2023-09-26 DIAGNOSIS — I480 Paroxysmal atrial fibrillation: Secondary | ICD-10-CM | POA: Insufficient documentation

## 2023-09-26 DIAGNOSIS — I4891 Unspecified atrial fibrillation: Secondary | ICD-10-CM | POA: Diagnosis not present

## 2023-09-26 MED ORDER — GADOBUTROL 1 MMOL/ML IV SOLN
7.0000 mL | Freq: Once | INTRAVENOUS | Status: AC | PRN
  Administered 2023-09-26: 7 mL via INTRAVENOUS

## 2023-09-26 NOTE — Progress Notes (Signed)
 [facility] NOTE   PCP: Loreli Elsie JONETTA Mickey., MD Cardiology: Dr. Rolan  Chief Complaint: Mitral Regurgitation Follow-up  HPI: 78 y.o. with history of narcolepsy and laryngopharyngeal reflux, mitral regurgitation and atrial fibrillation.  In early 2020, he developed dyspnea, a sensation that he was breathing through a straw. He had an extensive workup in 2020 with a normal ETT, normal PFTs, and normal high resolution CT chest.  Ultimately, he was diagnosed with laryngopharyngeal reflux. Symptoms were improved considerably with Protonix  + Zegerid.    With symptoms of LPR controlled, he did not have any exertional dyspnea or chest pain. In 11/21, however, he cut back on Protonix  and Zegerid, and dyspnea returned.  In addition, on 11/31/21, he developed tachypalpitations.  His Apple Watch showed atrial fibrillation. He brought the tracings to show me, they were definitely atrial fibrillation. This lasted for a few hours when resolved.  In hindsight, in 4/20, he had a very similar episode of tachypalpitations that was likely atrial fibrillation as well.  He has increased his Protonix  and Zegerid again and dyspnea has resolved.  He had atrial fibrillation again on 08/30/20 documented by Apple Watch.  He does not have OSA symptoms and is followed by Dr. Chalice for narcolepsy, which is controlled.    Echo 12/21, EF 55-60%, mild mitral valve prolapse with probably mild MR.    Coronary CTA was done in 3/22.  This was a difficult study but calcium  score 0 and no evidence for significant disease.  CT neck showed asymmetric right vallecula enlargement.  He saw ENT with examination that showed no evidence for malignancy.  He had total ankle replacement in 5/22.   Patient had diverticulitis in 9/22, treated as outpatient.   Patient tore his quadriceps tendon in 8/23 and had recent surgery to repair it.  He had a run of atrial fibrillation lasting 3-4 hours about 3 days after surgery.    Echo 8/23  EF 55%, normal RV, moderate MR with posterior leaflet prolapse.    Zio 6/24 showed no definite atrial fibrillation noted. Sleep study 5/24 showed mild sleep apnea.    Echo was done in 9/24 and I reviewed today, EF looks around 50-55% with low normal RV function, there is mitral valve prolapse with highly eccentric mitral regurgitation that is likely severe.   L/RHC (12/24): minimal nonobstructive CAD, RA 3, PA 18/6 (13), CO/CI (fick) 4.18/2.25  Admitted 12/17-12/20/24 for mitral valve repair, MAZE, LAA clip, and closure of PFO with Dr. Maryjane. Seen by [facility] clinic doing well post-procedure.   Today he returns for MR follow up. Overall feeling fine. Reports minimal pain. Denies increasing SOB, CP, dizziness, edema, or PND/Orthopnea. Did have some diarrhea that has now resolved. Worsening HA and vertigo since surgery. He has been seen by Neurology and started Emgality  with significant improvement of symptoms. Appetite ok. Has lost 10 lbs since discharge, feels that this is partially muscle mass. No fever or chills. Taking all medications.   EKG (personally reviewed): NSR 98 bpm  Labs (9/21): K 4.7, creatinine 1.42, TSH normal, hgb 14.2 Labs (12/21): BNP 17 Labs (2/22): LDL 141 Labs (5/22): K 4.5, creatinine 1.3, hgb 11.1 Labs (8/22): LDL 108 Labs (10/22): creatinine 1.29, hgb 14.5 Labs (8/23): K 3.8, creatinine 1.13 Labs (4/24): K 4.5, SCr 1.32 Labs (8/24): K 4.3, creatinine 1.1, LDL 99, TGs 50, hgb 14 Labs(12/24): K 4.5, creatinine 1.08  PMH: 1. HAV in 1985 2. Narcolepsy 3. Laryngopharyngeal reflux 4. Atrial fibrillation: Paroxysmal.  -  Zio 6/24: no definite a fib noted - Sleep study 5/24: mild sleep apnea 5. ETT (10/20): 11.6 METS, no ECG changes => normal study.  6. PFTs (10/20): Normal.  7. High resolution CT chest: Interstitial lung disease.  8. Mitral regurgitation: Echo (12/21) with EF 55-60%, mild mitral valve prolapse with probable mild MR - Echo (8/23): EF 55%, normal RV,  moderate MR with posterior leaflet prolapse.  - Echo (9/24): 50-55% with low normal RV function, there is mitral valve prolapse with highly eccentric mitral regurgitation that is likely severe.  9. Coronary CTA (3/22): Calcium  score 0, no evidence for significant CAD.  10. S/p total ankle replacement in 5/22.  11. 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   FH: No atrial fibrillation.  Father had mitral regurgitation.   ROS: All systems reviewed and negative except as per HPI.   Current Outpatient Medications  Medication Sig Dispense Refill   acetaminophen  (TYLENOL ) 325 MG tablet Take 325 mg by mouth every 6 (six) hours as needed for moderate pain.     apixaban  (ELIQUIS ) 5 MG TABS tablet Take 1 tablet (5 mg total) by mouth 2 (two) times daily. 180 tablet 3   CALCIUM -MAGNESIUM -ZINC PO Take 1 tablet by mouth 3 (three) times a week.     fluticasone  (FLONASE ) 50 MCG/ACT nasal spray Place 1 spray into both nostrils daily as needed for allergies or rhinitis.     Galcanezumab -gnlm (EMGALITY ) 120 MG/ML SOAJ Inject 120 mg into the skin every 30 (thirty) days.     Multiple Vitamin (MULTIVITAMIN) capsule Take 1 capsule by mouth 3 (three) times a week.     multivitamin-lutein (OCUVITE-LUTEIN) CAPS capsule Take 1 capsule by mouth daily.     Omeprazole-Sodium Bicarbonate  (ZEGERID) 20-1100 MG CAPS capsule Take 1-2 capsules by mouth at bedtime.     pantoprazole  (PROTONIX ) 40 MG tablet Take 20 mg by mouth daily.     Probiotic Product (ALIGN) 4 MG CAPS Take 4 mg by mouth 3 (three) times a week.     Rimegepant Sulfate  (NURTEC) 75 MG TBDP Take prn for migraine po.  Do not exceed 2 a day. 30 tablet 1   Rimegepant Sulfate  (NURTEC) 75 MG TBDP Take 1 tablet (75 mg total) by mouth as needed. 4 tablet 0   sildenafil (REVATIO) 20 MG tablet Take 40 mg by mouth daily as needed (ED).     Sodium Oxybate  500 MG/ML SOLN Take 2 grams at first dose and 2-3 hours  later take 3 grams. 120 mL 3   valACYclovir  (VALTREX ) 1000 MG tablet Take 500 mg by mouth every Monday, Wednesday, and Friday.     VITAMIN D  PO Take 1 tablet by mouth daily.     zaleplon  (SONATA ) 10 MG capsule Take 10 mg by mouth as needed (when flying overseas).     polyethylene glycol (MIRALAX  / GLYCOLAX ) 17 g packet Take 17 g by mouth See admin instructions. Take 17 g daily every night except skip dose on Sundays     No current facility-administered medications for this encounter.   BP 122/84   Pulse 89   Ht 5' 7 (1.702 m)   Wt 73.9 kg (163 lb)   SpO2 99%   BMI 25.53 kg/m   Filed Weights   09/27/23 1037  Weight: 73.9 kg (163 lb)   Physical Exam: General: Well appearing. No distress on RA HEENT: neck supple.   Cardiac: JVP not visible. S1 and S2 present.  No murmurs auscultated. MSI C/D/I. Resp: Lung sounds clear and equal B/L Abdomen: Soft, non-tender, non-distended. + BS. Extremities: Warm and dry. No rash, cyanosis.  No peripheral edema.  Neuro: Alert and oriented x3. Affect pleasant. Moves all extremities without difficulty.  Assessment/plan: 1. Atrial fibrillation: Paroxysmal.  CHADSVASC 1 (age).  Only mild OSA on sleep study (followed by Dr. Chalice already for narcolepsy).  Not a heavy drinker.  Weight is ideal.  Echo in 9/24 showed worsening of mitral regurgitation, probably now severe with mitral valve prolapse.  The mitral valve disease may be driving this atrial fibrillation. Now s/p MAZE and LAA clip 12/24. - In NSR on EKG today - On Eliquis  5 mg bid - Off Toprol  for HA and vertigo (followed by Neurology) 2. H/o Chest pain: Negative coronary CTA in 3/22, no further chest pain.  3. Mitral valve prolapse/MR: Father had mitral regurgitation. Moderate on echo in 8/23. Echo 9/24 echo showed EF in the 50-55% range with mitral valve prolapse and probably severe highly eccentric MR. TEE 12/24 showed severe MR with flail P1 and highly eccentric jet with small PFO. [facility] 12/24  with minimal CAD and normal filling pressure. He is now s/p MV repair + MAZE + LAA clip and PFO closure on 08/30/23 (indication for repairing MR is EF < 60% and paroxysmal atrial fibrillation).  - Repeat echo scheduled 10/12/23  Follow up with Dr. Rolan in in 3 months.  Nedra Mcinnis, NP 09/27/2023

## 2023-09-27 ENCOUNTER — Ambulatory Visit (HOSPITAL_COMMUNITY)
Admit: 2023-09-27 | Discharge: 2023-09-27 | Disposition: A | Discharge status: Home / Self Care | Payer: Medicare Other | Attending: Family Medicine

## 2023-09-27 ENCOUNTER — Encounter (HOSPITAL_COMMUNITY): Payer: Self-pay

## 2023-09-27 VITALS — BP 122/84 | HR 89 | Ht 67.0 in | Wt 163.0 lb

## 2023-09-27 DIAGNOSIS — I2510 Atherosclerotic heart disease of native coronary artery without angina pectoris: Secondary | ICD-10-CM | POA: Insufficient documentation

## 2023-09-27 DIAGNOSIS — I341 Nonrheumatic mitral (valve) prolapse: Secondary | ICD-10-CM | POA: Diagnosis not present

## 2023-09-27 DIAGNOSIS — I340 Nonrheumatic mitral (valve) insufficiency: Secondary | ICD-10-CM

## 2023-09-27 DIAGNOSIS — Z7901 Long term (current) use of anticoagulants: Secondary | ICD-10-CM | POA: Insufficient documentation

## 2023-09-27 DIAGNOSIS — Z8774 Personal history of (corrected) congenital malformations of heart and circulatory system: Secondary | ICD-10-CM | POA: Insufficient documentation

## 2023-09-27 DIAGNOSIS — G47419 Narcolepsy without cataplexy: Secondary | ICD-10-CM | POA: Diagnosis not present

## 2023-09-27 DIAGNOSIS — K219 Gastro-esophageal reflux disease without esophagitis: Secondary | ICD-10-CM | POA: Insufficient documentation

## 2023-09-27 DIAGNOSIS — I480 Paroxysmal atrial fibrillation: Secondary | ICD-10-CM

## 2023-09-27 NOTE — Patient Instructions (Signed)
 Medication Changes: No Changes In Medications at this time.   Follow-Up in: 3 months PLEASE CALL OUR OFFICE AROUND end of Feb/Early March TO GET SCHEDULED FOR YOUR APPOINTMENT. PHONE NUMBER IS [phone] OPTION 2   At the [facility], you and your health needs are our priority. We have a designated team specialized in the treatment of Heart Failure. This Care Team includes your primary Heart Failure Specialized Cardiologist (physician), Advanced Practice Providers (APPs- Physician Assistants and Nurse Practitioners), and Pharmacist who all work together to provide you with the care you need, when you need it.   You may see any of the following providers on your designated Care Team at your next follow up:  Dr. Toribio Fuel Dr. Ezra Shuck Dr. Ria Commander Dr. Odis Brownie Greig Mosses, NP Caffie Shed, [place] [facility] [place], [place] Beckey Coe, NP Jordan Lee, NP Tinnie Redman, PharmD   Please be sure to bring in all your medications bottles to every appointment.   Need to Contact Us :  If you have any questions or concerns before your next appointment please send us  a message through [place] or call our office at [phone].    TO LEAVE A MESSAGE FOR THE NURSE SELECT OPTION 2, PLEASE LEAVE A MESSAGE INCLUDING: YOUR NAME DATE OF BIRTH CALL BACK NUMBER REASON FOR CALL**this is important as we prioritize the call backs  YOU WILL RECEIVE A CALL BACK THE SAME DAY AS LONG AS YOU CALL BEFORE 4:00 PM

## 2023-09-28 ENCOUNTER — Encounter (HOSPITAL_COMMUNITY): Payer: Self-pay | Admitting: Cardiology

## 2023-09-28 ENCOUNTER — Telehealth: Payer: Self-pay | Admitting: Neurology

## 2023-09-28 ENCOUNTER — Encounter: Payer: Self-pay | Admitting: Neurology

## 2023-09-28 NOTE — Telephone Encounter (Signed)
 [facility] is calling with a call report on the pt in reference to the MRI that was completed on Monday 1/13.  The report is posted in epic and will make sure that Dr Albertina Hugger is made aware.   MRI brain: IMPRESSION: 1. 2 mm acute/subacute infarct within the left thalamus. 2. Mild-to-moderate chronic small vessel ischemic changes within the cerebral white matter. 3. Chronic pontomedullary parenchymal microhemorrhage posteriorly. 4. Mild generalized parenchymal atrophy. 5. Minor paranasal sinus disease. 6. Trace fluid within the bilateral mastoid air cells.

## 2023-09-28 NOTE — Telephone Encounter (Signed)
   [facility] Jerryl Morin) called with stat report for MRI of the Brain. Transferred to [place], [place]

## 2023-09-28 NOTE — Telephone Encounter (Signed)
 Left VM on Dr Leonora Ramus' phone about the 2 mm subacute stroke on MRI brain.  He is already on Eliquis , no need to add anything.   Avoiding  vaso-constrictve medications , we have changed from a triptan to Nurtec medication for migraine in our last visit. CD.

## 2023-09-29 ENCOUNTER — Ambulatory Visit (HOSPITAL_COMMUNITY): Admit: 2023-09-29 | Payer: BLUE CROSS/BLUE SHIELD | Admitting: Cardiology

## 2023-09-29 ENCOUNTER — Encounter (HOSPITAL_COMMUNITY): Payer: Self-pay

## 2023-09-29 SURGERY — ATRIAL FIBRILLATION ABLATION
Anesthesia: General

## 2023-10-04 ENCOUNTER — Other Ambulatory Visit: Payer: Self-pay | Admitting: Medical Genetics

## 2023-10-12 ENCOUNTER — Ambulatory Visit (HOSPITAL_COMMUNITY): Payer: Medicare Other | Attending: Cardiology

## 2023-10-12 DIAGNOSIS — Z9889 Other specified postprocedural states: Secondary | ICD-10-CM | POA: Insufficient documentation

## 2023-10-12 DIAGNOSIS — I340 Nonrheumatic mitral (valve) insufficiency: Secondary | ICD-10-CM | POA: Diagnosis not present

## 2023-10-12 LAB — ECHOCARDIOGRAM COMPLETE
Area-P 1/2: 4.23 cm2
Est EF: 45
MV VTI: 2.74 cm2
P 1/2 time: 448 ms
S' Lateral: 3.1 cm

## 2023-10-13 ENCOUNTER — Telehealth (HOSPITAL_COMMUNITY): Payer: Self-pay | Admitting: *Deleted

## 2023-10-13 NOTE — Telephone Encounter (Signed)
Called patient per Dr. Shirlee Latch with following echo results and instructions:  "LV EF mildly reduced at 45%, can see some drop after repair of severe MR.  Stable repaired mitral valve.  Needs to have followup with me."  Pt has repeat echo scheduled March. Asked him to call us in February to schedule appointment with Dr. Shirlee Latch. Pt verbalized understanding of same.   Pt placed on call back list as well per Derald Macleod.

## 2023-10-19 NOTE — Progress Notes (Signed)
 [address]       [place] [address]             [phone]           ZURICH CARRENO [facility] Medical Record #[id number] Date of Birth: [birth year]  Rolan Ezra RAMAN, MD Loreli Elsie JONETTA Mickey., MD  Chief Complaint:   follow up MV repair MAZE PFO closure  History of Present Illness:     Pt is now 7 weeks out from above and had recent echo with slightly depressed LV function to 45%  but with no MR and in NSR. Pt has done well from heart standpoint. He is very active on his reclining bike and ambulating about 8000 steps a day. He had neuro issues post discharge with vertigo and migraines and found by neurology to have 2mm subacute CVA. Medication manipulation improving symptoms significantly. Has had some tachycardia by watch but was in NSR and has started his BB back. To see Dr Rolan in March      Past Medical History:  Diagnosis Date   A-fib [facility])    on Eliquis    Actinic keratoses    Arthritis    Back pain    low   CKD (chronic kidney disease)    Colon polyp 2015   Diverticulitis    Dyspnea    on exertion   Dysrhythmia    A. Fib   Fever blister    episodically   Generalized headaches    GERD (gastroesophageal reflux disease)    Hepatitis A 1985   History of hiatal hernia    Hypersomnia    Knee injuries 2002   left   Migraine headache with aura 08/29/2014   Narcolepsy without cataplexy 04/12/2013    MSLT with  MSL 4.4 minutes.    Pain    facial trigemonal   Rotator cuff (capsule) sprain 08/29/2014   Seborrhea    chronic   Severe mitral regurgitation    Tendinitis    left middle finger    Past Surgical History:  Procedure Laterality Date   CLIPPING OF ATRIAL APPENDAGE  08/29/2023   Procedure: CLIPPING OF ATRIAL APPENDAGE USING MEDTRONIC 5OMM CLIP[;  Surgeon: Maryjane Mt, MD;  Location: [facility] OR;  Service: Open Heart Surgery;;   COLONOSCOPY  2011-last   every 5 years   FINGER SURGERY  2009/2010   trigger finger of left hand      HERNIA REPAIR  09/13/1961   right inguinal    LASIK  1998 or 99   MAZE N/A 08/29/2023   Procedure: MAZE;  Surgeon: Maryjane Mt, MD;  [facility]: [facility] OR;  Service: Open Heart Surgery;  Laterality: N/A;   MITRAL VALVE REPAIR N/A 08/29/2023   Procedure: MITRAL VALVE REPAIR USING SIMULUS ANNULOPLASTY BAND;  Surgeon: Maryjane Mt, MD;  Location: [facility] OR;  Service: Open Heart Surgery;  Laterality: N/A;   QUADRICEPS TENDON REPAIR Right 04/2022   REPAIR OF PATENT FORAMEN OVALE  08/29/2023   Procedure: CLOSURE OF PATENT FORAMEN OVALE;  Surgeon: Maryjane Mt, MD;  [facility]: [facility];  Service: Open Heart Surgery;;   RIGHT/LEFT HEART CATH AND CORONARY ANGIOGRAPHY N/A 08/19/2023   Procedure: RIGHT/LEFT HEART CATH AND CORONARY ANGIOGRAPHY;  Surgeon: Rolan Ezra RAMAN, MD;  Location: [facility] INVASIVE CV LAB;  Service: Cardiovascular;  Laterality: N/A;   TEE WITHOUT CARDIOVERSION N/A 06/24/2023   Procedure: TRANSESOPHAGEAL ECHOCARDIOGRAM;  Surgeon: Rolan Ezra RAMAN, MD;  Location: [facility] INVASIVE CV LAB;  Service: Cardiovascular;  Laterality: N/A;   TEE WITHOUT CARDIOVERSION N/A 08/29/2023   Procedure: TRANSESOPHAGEAL ECHOCARDIOGRAM;  Surgeon: Maryjane Mt, MD;  Location: [facility] OR;  Service: Open Heart Surgery;  Laterality: N/A;   TONSILLECTOMY AND ADENOIDECTOMY  09/13/1958   TOTAL ANKLE REPLACEMENT Right 2022   TRIGGER FINGER RELEASE  08/31/2012   Procedure: MINOR RELEASE TRIGGER FINGER/A-1 PULLEY;  Surgeon: Lamar LULLA Leonor Mickey., MD;  Location: [facility];  Service: Orthopedics;  Laterality: Right;  right long finger   UPPER GASTROINTESTINAL ENDOSCOPY     VASECTOMY      Social History   Tobacco Use  Smoking Status Never  Smokeless Tobacco Never    Social History   Substance and Sexual Activity  Alcohol  Use Not Currently   Alcohol /week: 1.0 standard drink of alcohol    Types: 1 Glasses of wine per week   Comment: 1 glass of wine or beer 3-4 times a week    Social History   Socioeconomic  History   Marital status: Married    Spouse name: [name]   Number of children: 2   Years of education: College   Highest education level: Not on file  Occupational History    Employer: [employer]  Tobacco Use   Smoking status: Never   Smokeless tobacco: Never  Vaping Use   Vaping status: Never Used  Substance and Sexual Activity   Alcohol  use: Not Currently    Alcohol /week: 1.0 standard drink of alcohol     Types: 1 Glasses of wine per week    Comment: 1 glass of wine or beer 3-4 times a week   Drug use: No   Sexual activity: Not on file  Other Topics Concern   Not on file  Social History Narrative   Patient is married ([place]) and lives at home with his wife.   Patient has two adult children.   Patient is working full-time.   Patient has a naval architect, Photographer.   Patient is right-handed.   Patient drinks two cups of coffee in the morning.   Social Drivers of Corporate Investment Banker Strain: Not on file  Food Insecurity: No Food Insecurity (08/30/2023)   Hunger Vital Sign    Worried About Running Out of Food in the Last Year: Never true    Ran Out of Food in the Last Year: Never true  Transportation Needs: No Transportation Needs (08/30/2023)   PRAPARE - Administrator, Civil Service (Medical): No    Lack of Transportation (Non-Medical): No  Physical Activity: Not on file  Stress: Not on file  Social Connections: Not on file  Intimate Partner Violence: Not At Risk (08/30/2023)   Humiliation, Afraid, Rape, and Kick questionnaire    Fear of Current or Ex-Partner: No    Emotionally Abused: No    Physically Abused: No    Sexually Abused: No    Allergies  Allergen Reactions   Azithromycin Photosensitivity   Tetracyclines & Related     photosensitivity - can take medication but just must avoid being out in the sun    Doxycycline Photosensitivity   Dulcolax Stool Softener [Dss] Nausea And Vomiting   Oxycodone Rash    Current  Outpatient Medications  Medication Sig Dispense Refill   acetaminophen  (TYLENOL ) 325 MG tablet Take 325 mg by mouth every 6 (six) hours as needed for moderate pain.     apixaban  (ELIQUIS ) 5 MG TABS tablet Take 1 tablet (5 mg total) by mouth 2 (two) times daily. 180  tablet 3   CALCIUM -MAGNESIUM -ZINC PO Take 1 tablet by mouth 3 (three) times a week.     fluticasone  (FLONASE ) 50 MCG/ACT nasal spray Place 1 spray into both nostrils daily as needed for allergies or rhinitis.     Galcanezumab -gnlm (EMGALITY ) 120 MG/ML SOAJ Inject 120 mg into the skin every 30 (thirty) days.     Multiple Vitamin (MULTIVITAMIN) capsule Take 1 capsule by mouth 3 (three) times a week.     multivitamin-lutein (OCUVITE-LUTEIN) CAPS capsule Take 1 capsule by mouth daily.     Omeprazole-Sodium Bicarbonate  (ZEGERID) 20-1100 MG CAPS capsule Take 1-2 capsules by mouth at bedtime.     pantoprazole  (PROTONIX ) 40 MG tablet Take 20 mg by mouth daily.     polyethylene glycol (MIRALAX  / GLYCOLAX ) 17 g packet Take 17 g by mouth See admin instructions. Take 17 g daily every night except skip dose on Sundays     Probiotic Product (ALIGN) 4 MG CAPS Take 4 mg by mouth 3 (three) times a week.     Rimegepant Sulfate  (NURTEC) 75 MG TBDP Take prn for migraine po.  Do not exceed 2 a day. 30 tablet 1   Rimegepant Sulfate  (NURTEC) 75 MG TBDP Take 1 tablet (75 mg total) by mouth as needed. 4 tablet 0   sildenafil (REVATIO) 20 MG tablet Take 40 mg by mouth daily as needed (ED).     Sodium Oxybate  500 MG/ML SOLN Take 2 grams at first dose and 2-3 hours later take 3 grams. 120 mL 3   valACYclovir  (VALTREX ) 1000 MG tablet Take 500 mg by mouth every Monday, Wednesday, and Friday.     VITAMIN D  PO Take 1 tablet by mouth daily.     zaleplon  (SONATA ) 10 MG capsule Take 10 mg by mouth as needed (when flying overseas).     No current facility-administered medications for this visit.     Family History  Problem Relation Age of Onset   Heart disease  Father 70   Hypertension Father    Prostate cancer Father    Cancer Mother        breast,colon,skin   Heart disease Mother    CVA Maternal Grandmother    Microcephaly Maternal Grandmother    Microcephaly Paternal Grandfather        Physical Exam: Sternum well healed Neuro intact Lungs: clear Card: RR without murmur     Diagnostic Studies & Laboratory data: I have personally reviewed the following studies and agree with the findings     Recent Radiology Findings:       Recent Lab Findings: Lab Results  Component Value Date   WBC 8.0 09/02/2023   HGB 8.7 (L) 09/02/2023   HCT 24.8 (L) 09/02/2023   PLT 150 09/02/2023   GLUCOSE 119 (H) 09/02/2023   CHOL 193 11/11/2022   TRIG 98 11/11/2022   HDL 52 11/11/2022   LDLCALC 121 (H) 11/11/2022   ALT 22 08/25/2023   AST 27 08/25/2023   NA 129 (L) 09/02/2023   K 4.5 09/02/2023   CL 98 09/02/2023   CREATININE 1.08 09/02/2023   BUN 21 09/02/2023   CO2 25 09/02/2023   INR 1.4 (H) 08/29/2023   HGBA1C 5.3 08/25/2023      Assessment / Plan:     Doing very well post op. We discussed restrictions moving forward with progressive increase in upper ext weight lifting. He will not need cardiac rehab. Discussed possilby off noac in 6 months if maintains NSR. Pt going back to  work next week. Very pleased with care   I have spent 30 min in review of the records, viewing studies and in face to face with patient and in coordination of future care    Deward Kallman 10/19/2023 12:04 PM

## 2023-10-20 ENCOUNTER — Ambulatory Visit: Payer: BLUE CROSS/BLUE SHIELD | Admitting: Thoracic Surgery (Cardiothoracic Vascular Surgery)

## 2023-10-20 ENCOUNTER — Ambulatory Visit (INDEPENDENT_AMBULATORY_CARE_PROVIDER_SITE_OTHER): Payer: Self-pay | Admitting: Thoracic Surgery (Cardiothoracic Vascular Surgery)

## 2023-10-20 ENCOUNTER — Encounter: Payer: Self-pay | Admitting: Thoracic Surgery (Cardiothoracic Vascular Surgery)

## 2023-10-20 VITALS — BP 113/77 | HR 95 | Resp 20 | Ht 67.0 in | Wt 163.0 lb

## 2023-10-20 DIAGNOSIS — Z9889 Other specified postprocedural states: Secondary | ICD-10-CM

## 2023-10-20 NOTE — Patient Instructions (Signed)
 Follow up prn

## 2023-11-03 ENCOUNTER — Telehealth: Payer: Self-pay | Admitting: Neurology

## 2023-11-03 ENCOUNTER — Encounter: Payer: Self-pay | Admitting: Neurology

## 2023-11-03 DIAGNOSIS — G43809 Other migraine, not intractable, without status migrainosus: Secondary | ICD-10-CM

## 2023-11-03 DIAGNOSIS — R42 Dizziness and giddiness: Secondary | ICD-10-CM

## 2023-11-03 NOTE — Addendum Note (Signed)
 Addended by: Judi Cong on: 11/03/2023 05:14 PM   Modules accepted: Orders

## 2023-11-03 NOTE — Telephone Encounter (Signed)
Referral for physical therapy sent through EPIC to [facility]. Phone: [phone] 

## 2023-11-10 ENCOUNTER — Ambulatory Visit: Payer: Medicare Other | Attending: Neurology | Admitting: Physical Therapy

## 2023-11-10 VITALS — BP 117/81 | HR 85

## 2023-11-10 DIAGNOSIS — G43809 Other migraine, not intractable, without status migrainosus: Secondary | ICD-10-CM | POA: Insufficient documentation

## 2023-11-10 DIAGNOSIS — R2681 Unsteadiness on feet: Secondary | ICD-10-CM | POA: Diagnosis not present

## 2023-11-10 DIAGNOSIS — R42 Dizziness and giddiness: Secondary | ICD-10-CM | POA: Diagnosis not present

## 2023-11-10 NOTE — Therapy (Signed)
 OUTPATIENT PHYSICAL THERAPY VESTIBULAR EVALUATION     Patient Name: Christopher Weber MRN: [id number] DOB:[birth year], 78 y.o., male Today's Date: 11/10/2023  END OF SESSION:  PT End of Session - 11/10/23 1137     Visit Number 1    Number of Visits 5    Date for PT Re-Evaluation 01/09/24    Authorization Type MEDICARE PART A AND B    PT Start Time 1135    PT Stop Time 1222    PT Time Calculation (min) 47 min    Equipment Utilized During Treatment Gait belt    Activity Tolerance Patient tolerated treatment well    Behavior During Therapy WFL for tasks assessed/performed             Past Medical History:  Diagnosis Date   A-fib (HCC)    on Eliquis   Actinic keratoses    Arthritis    Back pain    low   CKD (chronic kidney disease)    Colon polyp 2015   Diverticulitis    Dyspnea    on exertion   Dysrhythmia    A. Fib   Fever blister    episodically   Generalized headaches    GERD (gastroesophageal reflux disease)    Hepatitis A 1985   History of hiatal hernia    Hypersomnia    Knee injuries 2002   left   Migraine headache with aura 08/29/2014   Narcolepsy without cataplexy 04/12/2013    MSLT with  MSL 4.4 minutes.    Pain    facial trigemonal   Rotator cuff (capsule) sprain 08/29/2014   Seborrhea    chronic   Severe mitral regurgitation    Tendinitis    left middle finger   Past Surgical History:  Procedure Laterality Date   CLIPPING OF ATRIAL APPENDAGE  08/29/2023   Procedure: CLIPPING OF ATRIAL APPENDAGE USING MEDTRONIC 5OMM CLIP[;  Surgeon: Eugenio Hoes, MD;  [facility];  Service: Open Heart Surgery;;   COLONOSCOPY  2011-last   every 5 years   FINGER SURGERY  2009/2010   trigger finger of left hand     HERNIA REPAIR  09/13/1961   right inguinal    LASIK  1998 or 99   MAZE N/A 08/29/2023   Procedure: MAZE;  Surgeon: Eugenio Hoes, MD;  [facility];  Service: Open Heart Surgery;  Laterality: N/A;   MITRAL VALVE REPAIR N/A 08/29/2023    Procedure: MITRAL VALVE REPAIR USING SIMULUS ANNULOPLASTY BAND;  Surgeon: Eugenio Hoes, MD;  [facility];  Service: Open Heart Surgery;  Laterality: N/A;   QUADRICEPS TENDON REPAIR Right 04/2022   REPAIR OF PATENT FORAMEN OVALE  08/29/2023   Procedure: CLOSURE OF PATENT FORAMEN OVALE;  Surgeon: Eugenio Hoes, MD;  [facility];  Service: Open Heart Surgery;;   RIGHT/LEFT HEART CATH AND CORONARY ANGIOGRAPHY N/A 08/19/2023   Procedure: RIGHT/LEFT HEART CATH AND CORONARY ANGIOGRAPHY;  Surgeon: Laurey Morale, MD;  Location: [facility] INVASIVE CV LAB;  Service: Cardiovascular;  Laterality: N/A;   TEE WITHOUT CARDIOVERSION N/A 06/24/2023   Procedure: TRANSESOPHAGEAL ECHOCARDIOGRAM;  Surgeon: Laurey Morale, MD;  Location: [facility] INVASIVE CV LAB;  Service: Cardiovascular;  Laterality: N/A;   TEE WITHOUT CARDIOVERSION N/A 08/29/2023   Procedure: TRANSESOPHAGEAL ECHOCARDIOGRAM;  Surgeon: Eugenio Hoes, MD;  Location: [facility] OR;  Service: Open Heart Surgery;  Laterality: N/A;   TONSILLECTOMY AND ADENOIDECTOMY  09/13/1958   TOTAL ANKLE REPLACEMENT Right 2022   TRIGGER FINGER RELEASE  08/31/2012  Procedure: MINOR RELEASE TRIGGER FINGER/A-1 PULLEY;  Surgeon: Wyn Forster., MD;  Location: [facility];  Service: Orthopedics;  Laterality: Right;  right long finger   UPPER GASTROINTESTINAL ENDOSCOPY     VASECTOMY     Patient Active Problem List   Diagnosis Date Noted   Postop check 09/05/2023   S/P MVR (mitral valve repair) 08/29/2023   Vestibular migraine 07/07/2023   Mitral valve insufficiency, acquired 11/06/2021   Paroxysmal atrial fibrillation (HCC) 06/18/2021   Benign fasciculations 02/10/2017   Narcolepsy 08/28/2015   Encounter for medication management 08/28/2015   Controlled narcolepsy 08/29/2014   Rotator cuff (capsule) sprain 08/29/2014   Migraine headache with aura 08/29/2014   Narcolepsy cataplexy syndrome 02/21/2014   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 Would get dizziness and visual things going on. After the surgery, vertigo and dizziness would go to 4-5 times a week. Had a vertigo spell and fell into the wall and lowered himself to the floor. Was sweating and throwing up when he had that spell. Reports they checked his BP and was fine. When in a car with everything going by in the car, had to stop the car due to a vertigo spell. Had an episode of emesis outside the car. Had an episode of vertigo at the end of December when driving to the airport. On Feb 16th, went to work out and rode the recumbent bike/working in the yard and bent over to pick up a branch and got a severe vertigo spell and had to sit in the yard and had another episode of emesis. Felt like everything was spinning. In between these spells, most days will have a slight dull headache and will get visual zig zag for an aura for a migraine. After getting up, has 4-5 hours of feeling unsteady like he is walking in a canoe. Reports driving and things going by in the car does not affect him anymore. Sometimes if he moves his eyes or head too quick will get dizzy, but not get a full on vertigo spell. Feels like he is having some difficulty with his convergence. In the morning will have to walk wide base. Has been taking Zyrtec most days. Has been able to work out and has not been getting dizzy/lightheadedness. Now taking Nurtec for migraines   Was found to have a small subacute CVA with MRI brain. Dr. Vickey Huger thinks that this happened when pt  had his surgery in December 2024.    Pt accompanied by: self  PERTINENT HISTORY: PMH: A fib (on Eliquis), Vestibular migraines, CKD, Mitral valve repair 08/29/2023, R total ankle replacement 2024, R quad tendon repair 2023  08/29/23: He underwent mitral valve repair utilizing a 34mm simulus annuloplasty band, MAZE, atrial appendage clipping utilizing a 50mm medtronic clip and closure of a patent foramen ovale.   He had neuro issues post discharge with vertigo and migraines and found by neurology to have 2mm subacute CVA.   PAIN:  Are you having pain? "I'm 77, always, but nothing new"   Vitals:   11/10/23 1201  11/10/23 1202  BP: 120/79 117/81  Pulse: 84 85   Sitting, Standing   PRECAUTIONS: Fall  FALLS: Has patient fallen in last 6 months? Yes. 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   OBJECTIVE:  Note: Objective measures were completed at Evaluation unless otherwise noted.  DIAGNOSTIC FINDINGS: MRI brain 09/26/23: MRI brain: IMPRESSION: 1. 2 mm acute/subacute infarct within the left thalamus. 2. Mild-to-moderate chronic small vessel ischemic changes within the cerebral white matter. 3. Chronic pontomedullary parenchymal microhemorrhage posteriorly. 4. Mild generalized parenchymal atrophy. 5. Minor paranasal sinus disease. 6. Trace fluid within the bilateral mastoid air cells.  COGNITION: Overall cognitive status: Within functional limits for tasks assessed   Cervical ROM:   WNL   GAIT: Gait pattern: WFL Distance walked: Clinic distances  Assistive device utilized: None Level of assistance: Modified independence Comments: No overt unsteadiness noted, pt reports wide BOS when feeling unsteady     VESTIBULAR ASSESSMENT:  GENERAL OBSERVATION: Ambulates in independently with no AD    SYMPTOM BEHAVIOR:  Subjective history: See above   Non-Vestibular symptoms: headaches and migraine symptoms  Type of dizziness: Imbalance (Disequilibrium), Spinning/Vertigo, and Unsteady with head/body turns  Frequency: Almost daily unsteady episodes where feels like in a canoe and needs to walk with wide BOS   Duration: Depends   Aggravating factors: Induced by position change: rolling to the right, rolling to the left, and rolling too fast in the bed and Induced by motion: turning head quickly and feels like brain feels like it is slow when catching up (feels like this a lot, but does not go into a vertigo spell)  Relieving factors:  Symptoms get better as the day goes on   Progression of symptoms: better  OCULOMOTOR EXAM:  Ocular Alignment: normal  Ocular ROM: No Limitations  Spontaneous Nystagmus: absent  Gaze-Induced Nystagmus: absent  Smooth Pursuits: intact and at left lateral in horizontal direction, reported seeing 2 tips of pen (when it was closer)   Saccades: intact  Convergence/Divergence: 9 cm   VESTIBULAR - OCULAR REFLEX:   Slow VOR: Normal  VOR Cancellation: Normal  Head-Impulse Test: HIT Right: negative HIT Left: positive  Dynamic Visual Acuity: Static: Line 8 Dynamic: Line 4   POSITIONAL TESTING: Right Dix-Hallpike: no nystagmus Left Dix-Hallpike: no nystagmus Right Roll Test: no nystagmus Left Roll Test: no nystagmus  MOTION SENSITIVITY:  Motion Sensitivity Quotient Intensity: 0 = none, 1 = Lightheaded, 2 = Mild, 3 = Moderate, 4 = Severe, 5 = Vomiting  Intensity  1. Sitting to supine 0  2. Supine to L side 0  3. Supine to R side 0  4. Supine to sitting 0  5. L Hallpike-Dix 0  6. Up from L  0  7. R Hallpike-Dix 0  8. Up from R  0  9. Sitting, head tipped to L knee   10. Head up from L knee   11. Sitting, head tipped to R knee   12. Head up from R knee   13.  Sitting head turns x5   14.Sitting head nods x5   15. In stance, 180 turn to L    16. In stance, 180 turn to R      M-CTSIB  Condition  1: Firm Surface, EO 30 Sec, Normal Sway  Condition 2: Firm Surface, EC 30 Sec, Mild Sway  Condition 3: Foam Surface, EO 30 Sec, Normal Sway  Condition 4: Foam Surface, EC 6 Sec                                                                                                                                TREATMENT DATE:  N/A during eval  PATIENT EDUCATION: Education details: Clinical findings, POC, areas vestibular therapy will address, education regarding vestibular hypofunction  Person educated: Patient Education method: Explanation Education comprehension: verbalized understanding  HOME EXERCISE PROGRAM:  GOALS: Goals reviewed with patient? Yes  SHORT TERM GOALS: ALL STGS = LTGS   LONG TERM GOALS: Target date: 12/08/2023  Pt will be independent with final vestibular HEP in order to build upon functional gains made in therapy.; Baseline:  Goal status: INITIAL  2.  Pt will improve DVA to 2 line difference or less in order to demo improved VOR.  Baseline: 4 line difference  Goal status: INITIAL  3.  Pt will improve condition 4 of mCTSIB to at least 20 seconds to demo improved vestibular input for balance.  Baseline:  Goal status: INITIAL  4.  SOT to be assessed with goal written.  Baseline:  Goal status: INITIAL   ASSESSMENT:  CLINICAL IMPRESSION: Patient is a 78 year old male referred to Neuro OPPT for Vertigo/Vestibular Migraine.   On 08/29/23, pt underwent mitral valve repair utilizing a 34mm simulus annuloplasty band, MAZE, atrial appendage clipping utilizing a 50mm medtronic clip and closure of a patent foramen ovale. Since surgery, pt reporting unsteadiness and dizziness. Has had some vertigo episodes with emesis, but pt reporting improvement in symptoms since surgery. The following deficits were present during the exam:  impaired convergence, positive HIT to the L and 4 line difference on DVA indicating impaired VOR. Pt also only able to hold condition 4 of mCTSIB for 6 seconds, indicating decr vestibular input for balance.  Pt would benefit from skilled PT to address these impairments and functional limitations to maximize functional mobility independence and improve balance/vestibular system.    OBJECTIVE IMPAIRMENTS: Abnormal gait, decreased balance, and dizziness.   ACTIVITY LIMITATIONS: locomotion level  PARTICIPATION LIMITATIONS:  N/A  PERSONAL FACTORS: Age, Behavior pattern, Past/current experiences, Time since onset of injury/illness/exacerbation, and 1-2 comorbidities: A fib (on Eliquis), CKD, Mitral valve repair 08/29/2023, R total ankle replacement 2024, R quad tendon repair 2023, vestibular migraines  are also affecting patient's functional outcome.   REHAB POTENTIAL: Good  CLINICAL DECISION MAKING: Stable/uncomplicated  EVALUATION COMPLEXITY: Low   PLAN:  PT FREQUENCY: 1x/week  PT DURATION: 8 weeks  PLANNED INTERVENTIONS: 97164- PT Re-evaluation, 97110-Therapeutic exercises, 97530- Therapeutic activity, [id number]- Neuromuscular re-education, 97535- Self Care, 16109- Manual therapy, Patient/Family education, Balance training, and Vestibular training  PLAN FOR NEXT SESSION: check SOT and write goal, initial HEP for VOR and vestibular input for balance, work on  convergence    Drake Leach, PT, DPT 11/10/2023, 12:36 PM

## 2023-11-18 ENCOUNTER — Encounter: Payer: Self-pay | Admitting: Physical Therapy

## 2023-11-18 ENCOUNTER — Ambulatory Visit: Payer: Medicare Other | Attending: Neurology | Admitting: Physical Therapy

## 2023-11-18 DIAGNOSIS — R42 Dizziness and giddiness: Secondary | ICD-10-CM | POA: Insufficient documentation

## 2023-11-18 DIAGNOSIS — R2681 Unsteadiness on feet: Secondary | ICD-10-CM | POA: Diagnosis not present

## 2023-11-18 NOTE — Therapy (Signed)
 OUTPATIENT PHYSICAL THERAPY VESTIBULAR TREATMENT     Patient Name: Christopher Weber MRN: [id number] DOB:1946-02-17, 78 y.o., male Today's Date: 11/18/2023  END OF SESSION:  PT End of Session - 11/18/23 0933     Visit Number 2    Number of Visits 5    Date for PT Re-Evaluation 01/09/24    Authorization Type MEDICARE PART A AND B    PT Start Time 0931    PT Stop Time 1013    PT Time Calculation (min) 42 min    Equipment Utilized During Treatment --   SOT harness   Activity Tolerance Patient tolerated treatment well    Behavior During Therapy WFL for tasks assessed/performed             Past Medical History:  Diagnosis Date   A-fib (HCC)    on Eliquis   Actinic keratoses    Arthritis    Back pain    low   CKD (chronic kidney disease)    Colon polyp 2015   Diverticulitis    Dyspnea    on exertion   Dysrhythmia    A. Fib   Fever blister    episodically   Generalized headaches    GERD (gastroesophageal reflux disease)    Hepatitis A 1985   History of hiatal hernia    Hypersomnia    Knee injuries 2002   left   Migraine headache with aura 08/29/2014   Narcolepsy without cataplexy 04/12/2013    MSLT with  MSL 4.4 minutes.    Pain    facial trigemonal   Rotator cuff (capsule) sprain 08/29/2014   Seborrhea    chronic   Severe mitral regurgitation    Tendinitis    left middle finger   Past Surgical History:  Procedure Laterality Date   CLIPPING OF ATRIAL APPENDAGE  08/29/2023   Procedure: CLIPPING OF ATRIAL APPENDAGE USING MEDTRONIC 5OMM CLIP[;  Surgeon: Eugenio Hoes, MD;  Location: [facility] OR;  Service: Open Heart Surgery;;   COLONOSCOPY  2011-last   every 5 years   FINGER SURGERY  2009/2010   trigger finger of left hand     HERNIA REPAIR  09/13/1961   right inguinal    LASIK  1998 or 99   MAZE N/A 08/29/2023   Procedure: MAZE;  Surgeon: Eugenio Hoes, MD;  Location: [facility] OR;  Service: Open Heart Surgery;  Laterality: N/A;   MITRAL VALVE REPAIR N/A  08/29/2023   Procedure: MITRAL VALVE REPAIR USING SIMULUS ANNULOPLASTY BAND;  Surgeon: Eugenio Hoes, MD;  Location: [facility] OR;  Service: Open Heart Surgery;  Laterality: N/A;   QUADRICEPS TENDON REPAIR Right 04/2022   REPAIR OF PATENT FORAMEN OVALE  08/29/2023   Procedure: CLOSURE OF PATENT FORAMEN OVALE;  Surgeon: Eugenio Hoes, MD;  Location: [facility] OR;  Service: Open Heart Surgery;;   RIGHT/LEFT HEART CATH AND CORONARY ANGIOGRAPHY N/A 08/19/2023   Procedure: RIGHT/LEFT HEART CATH AND CORONARY ANGIOGRAPHY;  Surgeon: Laurey Morale, MD;  Location: [facility] INVASIVE CV LAB;  Service: Cardiovascular;  Laterality: N/A;   TEE WITHOUT CARDIOVERSION N/A 06/24/2023   Procedure: TRANSESOPHAGEAL ECHOCARDIOGRAM;  Surgeon: Laurey Morale, MD;  Location[facility] INVASIVE CV LAB;  Service: Cardiovascular;  Laterality: N/A;   TEE WITHOUT CARDIOVERSION N/A 08/29/2023   Procedure: TRANSESOPHAGEAL ECHOCARDIOGRAM;  Surgeon: Eugenio Hoes, MD;  Location: [facility] OR;  Service: Open Heart Surgery;  Laterality: N/A;   TONSILLECTOMY AND ADENOIDECTOMY  09/13/1958   TOTAL ANKLE REPLACEMENT Right 2022   TRIGGER FINGER RELEASE  08/31/2012   Procedure: MINOR RELEASE TRIGGER FINGER/A-1 PULLEY;  Surgeon: Wyn Forster., MD;  Location: [facility];  Service: Orthopedics;  Laterality: Right;  right long finger   UPPER GASTROINTESTINAL ENDOSCOPY     VASECTOMY     Patient Active Problem List   Diagnosis Date Noted   Postop check 09/05/2023   S/P MVR (mitral valve repair) 08/29/2023   Vestibular migraine 07/07/2023   Mitral valve insufficiency, acquired 11/06/2021   Paroxysmal atrial fibrillation (HCC) 06/18/2021   Benign fasciculations 02/10/2017   Narcolepsy 08/28/2015   Encounter for medication management 08/28/2015   Controlled narcolepsy 08/29/2014   Rotator cuff (capsule) sprain 08/29/2014   Migraine headache with aura 08/29/2014   Narcolepsy cataplexy syndrome 02/21/2014   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 Having a little dizziness today.   Pt accompanied by: self  PERTINENT HISTORY: PMH: A fib (on Eliquis), Vestibular migraines, CKD, Mitral valve repair 08/29/2023, R total ankle replacement 2024, R quad tendon repair 2023  08/29/23: He underwent mitral valve repair utilizing a 34mm simulus annuloplasty band, MAZE, atrial appendage clipping utilizing a 50mm medtronic clip and closure of a patent foramen ovale.   He had neuro issues post discharge with vertigo and migraines and found by neurology to have 2mm subacute CVA.   Was found to have a small subacute CVA with MRI brain. Dr. Vickey Huger thinks that this happened when pt had his surgery in December 2024.    PAIN:  Are you having pain? "I'm 77, always, but nothing new"   There were no vitals filed for this visit.  Sitting, Standing   PRECAUTIONS: Fall  FALLS: Has patient fallen in last 6 months? Yes. 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   OBJECTIVE:  Note: Objective measures were completed at Evaluation unless otherwise noted.  DIAGNOSTIC FINDINGS: MRI brain 09/26/23: MRI brain: IMPRESSION: 1. 2 mm acute/subacute infarct within the left thalamus. 2. Mild-to-moderate chronic small vessel ischemic changes within the cerebral  white matter. 3. Chronic pontomedullary parenchymal microhemorrhage posteriorly. 4. Mild generalized parenchymal atrophy. 5. Minor paranasal sinus disease. 6. Trace fluid within the bilateral mastoid air cells.  COGNITION: Overall cognitive status: Within functional limits for tasks assessed   Cervical ROM:   WNL   GAIT: Gait pattern: WFL Distance walked: Clinic distances  Assistive device utilized: None Level of assistance: Modified independence Comments: No overt unsteadiness noted, pt reports wide BOS when feeling unsteady    VESTIBULAR ASSESSMENT:  GENERAL OBSERVATION: Ambulates in independently with no AD    SYMPTOM BEHAVIOR:  Subjective history: See above   Non-Vestibular symptoms: headaches and migraine symptoms  Type of dizziness: Imbalance (Disequilibrium), Spinning/Vertigo, and Unsteady with head/body turns  Frequency: Almost daily unsteady episodes where feels like in a canoe and needs to walk with wide BOS   Duration:  Depends   Aggravating factors: Induced by position change: rolling to the right, rolling to the left, and rolling too fast in the bed and Induced by motion: turning head quickly and feels like brain feels like it is slow when catching up (feels like this a lot, but does not go into a vertigo spell)  Relieving factors:  Symptoms get better as the day goes on   Progression of symptoms: better  OCULOMOTOR EXAM:  Ocular Alignment: normal  Ocular ROM: No Limitations  Spontaneous Nystagmus: absent  Gaze-Induced Nystagmus: absent  Smooth Pursuits: intact and at left lateral in horizontal direction, reported seeing 2 tips of pen (when it was closer)   Saccades: intact  Convergence/Divergence: 9 cm   VESTIBULAR - OCULAR REFLEX:   Slow VOR: Normal  VOR Cancellation: Normal  Head-Impulse Test: HIT Right: negative HIT Left: positive  Dynamic Visual Acuity: Static: Line 8 Dynamic: Line 4   POSITIONAL TESTING: Right Dix-Hallpike: no nystagmus Left  Dix-Hallpike: no nystagmus Right Roll Test: no nystagmus Left Roll Test: no nystagmus  MOTION SENSITIVITY:  Motion Sensitivity Quotient Intensity: 0 = none, 1 = Lightheaded, 2 = Mild, 3 = Moderate, 4 = Severe, 5 = Vomiting  Intensity  1. Sitting to supine 0  2. Supine to L side 0  3. Supine to R side 0  4. Supine to sitting 0  5. L Hallpike-Dix 0  6. Up from L  0  7. R Hallpike-Dix 0  8. Up from R  0  9. Sitting, head tipped to L knee   10. Head up from L knee   11. Sitting, head tipped to R knee   12. Head up from R knee   13. Sitting head turns x5   14.Sitting head nods x5   15. In stance, 180 turn to L    16. In stance, 180 turn to R      M-CTSIB  Condition 1: Firm Surface, EO 30 Sec, Normal Sway  Condition 2: Firm Surface, EC 30 Sec, Mild Sway  Condition 3: Foam Surface, EO 30 Sec, Normal Sway  Condition 4: Foam Surface, EC 6 Sec                                                                                                                                TREATMENT DATE:   NMR:   Sensory Organization Test performed with following results: Conditions: 1: 3 trials WNL 2: 3 trials WNL 3: 3 trials WNL 4: 3 trials WNL 5: 3 trials WNL 6: 2 trials WNL, 1 fall  Composite score: 81 (above normal)  Sensory Analysis Som: Above normal  Vis: Above normal   Vest: Above normal   Pref: Above normal    Strategy analysis: Equal hip and ankle strategy        COG alignment: Anterior and to the L         Initiated HEP for gaze stabilization and standing  balance:   Gaze Adaptation: x1 Viewing Horizontal: Position: Standing, Time: 30 seconds, Reps: 3, and Comment: Initial cues for technique, pt notes more difficulty when going to the L  x1 Viewing Vertical:  Position: Standing, Time: 30 seconds, Reps: 1, and Comment: pt reporting no issues with this    Access Code: [email or web address] URL: [email or web address] Date: 11/18/2023 Prepared by: Sherlie Ban  Exercises - Standing Balance with Eyes Closed on Foam  - 1-2 x daily - 5 x weekly - 3 sets - 30 hold - Wide Stance with Eyes Closed and Head Rotation on Foam Pad  - 1-2 x daily - 5 x weekly - 2 sets - 10 reps - Wide Stance with Eyes Closed and Head Nods on Foam Pad  - 1-2 x daily - 5 x weekly - 2 sets - 10 reps  PATIENT EDUCATION: Education details: Results of SOT, initial HEP for vestibular input, purpose of exercises  Person educated: Patient Education method: Explanation, Demonstration, Verbal cues, and Handouts Education comprehension: verbalized understanding, returned demonstration, and verbal cues required  HOME EXERCISE PROGRAM: Standing VOR x30 seconds   Access Code: [email or web address] URL: [email or web address] Date: 11/18/2023 Prepared by: Sherlie Ban  Exercises - Standing Balance with Eyes Closed on Foam  - 1-2 x daily - 5 x weekly - 3 sets - 30 hold - Wide Stance with Eyes Closed and Head Rotation on Foam Pad  - 1-2 x daily - 5 x weekly - 2 sets - 10 reps - Wide Stance with Eyes Closed and Head Nods on Foam Pad  - 1-2 x daily - 5 x weekly - 2 sets - 10 reps  GOALS: Goals reviewed with patient? Yes  SHORT TERM GOALS: ALL STGS = LTGS   LONG TERM GOALS: Target date: 12/08/2023  Pt will be independent with final vestibular HEP in order to build upon functional gains made in therapy.; Baseline:  Goal status: INITIAL  2.  Pt will improve DVA to 2 line difference or less in order to demo improved VOR.  Baseline: 4 line difference  Goal status: INITIAL  3.  Pt will improve condition 4 of mCTSIB to at least 20 seconds to demo improved vestibular input for balance.  Baseline:  Goal status: INITIAL  4.  SOT to be assessed with goal written.  Baseline: all above normal, goal not needed  Goal status: N/A   ASSESSMENT:  CLINICAL IMPRESSION: Performed the SOT today with pt scoring above normal for composite score and all components of sensory  analysis. LTG not needed. Pt did report dizziness with conditions 5 and 6 and did feel more off balance. Remainder of session focused on initiating HEP for VOR, standing balance with vestibular input. With VOR tasks, pt initially had more trouble keeping the L eye focused. Will continue per POC.    OBJECTIVE IMPAIRMENTS: Abnormal gait, decreased balance, and dizziness.   ACTIVITY LIMITATIONS: locomotion level  PARTICIPATION LIMITATIONS:  N/A  PERSONAL FACTORS: Age, Behavior pattern, Past/current experiences, Time since onset of injury/illness/exacerbation, and 1-2 comorbidities: A fib (on Eliquis), CKD, Mitral valve repair 08/29/2023, R total ankle replacement 2024, R quad tendon repair 2023, vestibular migraines  are also affecting patient's functional outcome.   REHAB POTENTIAL: Good  CLINICAL DECISION MAKING: Stable/uncomplicated  EVALUATION COMPLEXITY: Low   PLAN:  PT FREQUENCY: 1x/week  PT DURATION: 8 weeks  PLANNED INTERVENTIONS: 97164- PT Re-evaluation, 97110-Therapeutic exercises, 97530- Therapeutic activity, [id number]- Neuromuscular re-education, 97535- Self Care, [id number]- Manual therapy, Patient/Family education, Balance training, and Vestibular training  PLAN FOR NEXT SESSION: progress VOR tasks, vestibular input for balance, work on [employer], PT, DPT 11/18/2023, 10:23 AM

## 2023-11-18 NOTE — Patient Instructions (Signed)
 Gaze Stabilization: Standing Feet Apart    Feet shoulder width apart, keeping eyes on target on wall __a few__ feet away, tilt head down 15-30 and move head side to side for __30__ seconds.   Perform 3 sets.  Do _1-2__ sessions per day.   Copyright  VHI. All rights reserved.

## 2023-11-21 ENCOUNTER — Encounter: Payer: Self-pay | Admitting: Neurology

## 2023-11-23 ENCOUNTER — Other Ambulatory Visit: Payer: Self-pay | Admitting: Neurology

## 2023-11-23 MED ORDER — PREDNISONE 10 MG PO TABS: ORAL_TABLET | ORAL | 0 refills | Status: DC

## 2023-11-23 NOTE — Progress Notes (Signed)
 Prednisone dose pack ordered to local pharmacy.  CD

## 2023-11-24 ENCOUNTER — Other Ambulatory Visit (HOSPITAL_COMMUNITY): Payer: BLUE CROSS/BLUE SHIELD

## 2023-11-28 ENCOUNTER — Telehealth: Payer: Self-pay | Admitting: Neurology

## 2023-11-28 NOTE — Telephone Encounter (Signed)
 Received a PA request to be completed for the patient for Sodium Oxybate 50 mg/ml.  Attempted to complete thorough CMM KEY: [id number] States Prior Authorization Not Required  Pt should be able to get the medication under insurance

## 2023-12-01 ENCOUNTER — Encounter: Payer: Self-pay | Admitting: Physical Therapy

## 2023-12-01 ENCOUNTER — Ambulatory Visit: Payer: Medicare Other | Admitting: Physical Therapy

## 2023-12-01 DIAGNOSIS — R2681 Unsteadiness on feet: Secondary | ICD-10-CM

## 2023-12-01 DIAGNOSIS — R42 Dizziness and giddiness: Secondary | ICD-10-CM | POA: Diagnosis not present

## 2023-12-01 NOTE — Therapy (Signed)
 OUTPATIENT PHYSICAL THERAPY VESTIBULAR TREATMENT     Patient Name: Christopher Weber MRN: [id number] DOB:08-29-1946, 78 y.o., male Today's Date: 12/01/2023  END OF SESSION:  PT End of Session - 12/01/23 0806     Visit Number 3    Number of Visits 5    Date for PT Re-Evaluation 01/09/24    Authorization Type MEDICARE PART A AND B    PT Start Time 0804    PT Stop Time 0844    PT Time Calculation (min) 40 min    Equipment Utilized During Treatment --    Activity Tolerance Patient tolerated treatment well    Behavior During Therapy WFL for tasks assessed/performed             Past Medical History:  Diagnosis Date   A-fib (HCC)    on Eliquis   Actinic keratoses    Arthritis    Back pain    low   CKD (chronic kidney disease)    Colon polyp 2015   Diverticulitis    Dyspnea    on exertion   Dysrhythmia    A. Fib   Fever blister    episodically   Generalized headaches    GERD (gastroesophageal reflux disease)    Hepatitis A 1985   History of hiatal hernia    Hypersomnia    Knee injuries 2002   left   Migraine headache with aura 08/29/2014   Narcolepsy without cataplexy 04/12/2013    MSLT with  MSL 4.4 minutes.    Pain    facial trigemonal   Rotator cuff (capsule) sprain 08/29/2014   Seborrhea    chronic   Severe mitral regurgitation    Tendinitis    left middle finger   Past Surgical History:  Procedure Laterality Date   CLIPPING OF ATRIAL APPENDAGE  08/29/2023   Procedure: CLIPPING OF ATRIAL APPENDAGE USING MEDTRONIC 5OMM CLIP[;  Surgeon: Eugenio Hoes, MD;  Location: [facility] OR;  Service: Open Heart Surgery;;   COLONOSCOPY  2011-last   every 5 years   FINGER SURGERY  2009/2010   trigger finger of left hand     HERNIA REPAIR  09/13/1961   right inguinal    LASIK  1998 or 99   MAZE N/A 08/29/2023   Procedure: MAZE;  Surgeon: Eugenio Hoes, MD;  Location: [facility] OR;  Service: Open Heart Surgery;  Laterality: N/A;   MITRAL VALVE REPAIR N/A 08/29/2023    Procedure: MITRAL VALVE REPAIR USING SIMULUS ANNULOPLASTY BAND;  Surgeon: Eugenio Hoes, MD;  Location: [facility] OR;  Service: Open Heart Surgery;  Laterality: N/A;   QUADRICEPS TENDON REPAIR Right 04/2022   REPAIR OF PATENT FORAMEN OVALE  08/29/2023   Procedure: CLOSURE OF PATENT FORAMEN OVALE;  Surgeon: Eugenio Hoes, MD;  Location: [facility] OR;  Service: Open Heart Surgery;;   RIGHT/LEFT HEART CATH AND CORONARY ANGIOGRAPHY N/A 08/19/2023   Procedure: RIGHT/LEFT HEART CATH AND CORONARY ANGIOGRAPHY;  Surgeon: Laurey Morale, MD;  Location: [facility] INVASIVE CV LAB;  Service: Cardiovascular;  Laterality: N/A;   TEE WITHOUT CARDIOVERSION N/A 06/24/2023   Procedure: TRANSESOPHAGEAL ECHOCARDIOGRAM;  Surgeon: Laurey Morale, MD;  Location: [facility] INVASIVE CV LAB;  Service: Cardiovascular;  Laterality: N/A;   TEE WITHOUT CARDIOVERSION N/A 08/29/2023   Procedure: TRANSESOPHAGEAL ECHOCARDIOGRAM;  Surgeon: Eugenio Hoes, MD;  Location: [facility] OR;  Service: Open Heart Surgery;  Laterality: N/A;   TONSILLECTOMY AND ADENOIDECTOMY  09/13/1958   TOTAL ANKLE REPLACEMENT Right 2022   TRIGGER FINGER RELEASE  08/31/2012  Procedure: MINOR RELEASE TRIGGER FINGER/A-1 PULLEY;  Surgeon: Wyn Forster., MD;  Location: [facility];  Service: Orthopedics;  Laterality: Right;  right long finger   UPPER GASTROINTESTINAL ENDOSCOPY     VASECTOMY     Patient Active Problem List   Diagnosis Date Noted   Postop check 09/05/2023   S/P MVR (mitral valve repair) 08/29/2023   Vestibular migraine 07/07/2023   Mitral valve insufficiency, acquired 11/06/2021   Paroxysmal atrial fibrillation (HCC) 06/18/2021   Benign fasciculations 02/10/2017   Narcolepsy 08/28/2015   Encounter for medication management 08/28/2015   Controlled narcolepsy 08/29/2014   Rotator cuff (capsule) sprain 08/29/2014   Migraine headache with aura 08/29/2014   Narcolepsy cataplexy syndrome 02/21/2014   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 Walked 7-7.5 hours. In the morning would have a little bit of the canoe walk and having some this morning. On Monday, was just sitting at work and had an episode of vertigo and couldn't see a thing. Took a Nurtec and it lasted 15 minutes, then felt some unsteadiness the rest of the day. Started a steroid taper with a lower yield from Dr. Vickey Huger. Went to hit some golf balls and did feel a little imbalance when taking a full swing.   Pt accompanied by: self  PERTINENT HISTORY: PMH: A fib (on Eliquis), Vestibular migraines, CKD, Mitral valve repair 08/29/2023, R total ankle replacement 2024, R quad tendon repair 2023  08/29/23: He underwent mitral valve repair utilizing a 34mm simulus annuloplasty band, MAZE, atrial appendage clipping utilizing a 50mm medtronic clip and closure of a patent foramen ovale.   He had neuro issues post discharge with vertigo and migraines and found by neurology to have 2mm subacute CVA.   Was found to have a small subacute CVA with MRI brain. Dr. Vickey Huger thinks that this happened when pt had his surgery in December 2024.    PAIN:  Are you having pain? "I'm 78, always, but nothing new"   There were no vitals filed for this visit.  Sitting, Standing   PRECAUTIONS: Fall  FALLS: Has patient fallen in last 6 months? Yes. 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   OBJECTIVE:  Note: Objective measures were completed at Evaluation unless otherwise noted.  DIAGNOSTIC FINDINGS: MRI brain 09/26/23: MRI brain: IMPRESSION: 1. 2 mm acute/subacute infarct within the left thalamus. 2. Mild-to-moderate chronic small vessel ischemic changes within the cerebral white matter. 3. Chronic pontomedullary parenchymal microhemorrhage posteriorly. 4. Mild generalized parenchymal atrophy. 5. Minor paranasal sinus disease. 6. Trace fluid within the bilateral mastoid air cells.  COGNITION: Overall cognitive status: Within functional limits for tasks assessed   Cervical ROM:   WNL   GAIT: Gait pattern: WFL Distance walked: Clinic distances  Assistive device utilized: None Level of assistance: Modified independence Comments: No overt unsteadiness noted,  pt reports wide BOS when feeling unsteady    VESTIBULAR ASSESSMENT:  GENERAL OBSERVATION: Ambulates in independently with no AD    SYMPTOM BEHAVIOR:  Subjective history: See above   Non-Vestibular symptoms: headaches and migraine symptoms  Type of dizziness: Imbalance (Disequilibrium), Spinning/Vertigo, and Unsteady with head/body turns  Frequency: Almost daily unsteady episodes where feels like in a canoe and needs to walk with wide BOS   Duration: Depends   Aggravating factors: Induced by position change: rolling to the right, rolling to the left, and rolling too fast in the bed and Induced by motion: turning head quickly and feels like brain feels like it is slow when catching up (feels like this a lot, but does not go into a vertigo spell)  Relieving factors:  Symptoms get better as the day goes on   Progression of symptoms: better  OCULOMOTOR EXAM:  Ocular Alignment: normal  Ocular ROM: No Limitations  Spontaneous Nystagmus: absent  Gaze-Induced Nystagmus: absent  Smooth Pursuits: intact and at left lateral in horizontal  direction, reported seeing 2 tips of pen (when it was closer)   Saccades: intact  Convergence/Divergence: 9 cm   VESTIBULAR - OCULAR REFLEX:   Slow VOR: Normal  VOR Cancellation: Normal  Head-Impulse Test: HIT Right: negative HIT Left: positive  Dynamic Visual Acuity: Static: Line 8 Dynamic: Line 4   POSITIONAL TESTING: Right Dix-Hallpike: no nystagmus Left Dix-Hallpike: no nystagmus Right Roll Test: no nystagmus Left Roll Test: no nystagmus  MOTION SENSITIVITY:  Motion Sensitivity Quotient Intensity: 0 = none, 1 = Lightheaded, 2 = Mild, 3 = Moderate, 4 = Severe, 5 = Vomiting  Intensity  1. Sitting to supine 0  2. Supine to L side 0  3. Supine to R side 0  4. Supine to sitting 0  5. L Hallpike-Dix 0  6. Up from L  0  7. R Hallpike-Dix 0  8. Up from R  0  9. Sitting, head tipped to L knee   10. Head up from L knee   11. Sitting, head tipped to R knee   12. Head up from R knee   13. Sitting head turns x5   14.Sitting head nods x5   15. In stance, 180 turn to L    16. In stance, 180 turn to R      M-CTSIB  Condition 1: Firm Surface, EO 30 Sec, Normal Sway  Condition 2: Firm Surface, EC 30 Sec, Mild Sway  Condition 3: Foam Surface, EO 30 Sec, Normal Sway  Condition 4: Foam Surface, EC 6 Sec                                                                                                                                TREATMENT DATE:   NMR:   Working on convergence holding cup and placing toothpicks in straw, approx. 6-8 cm from face, performed in sitting and then in standing   Gaze Adaptation:  x1 Viewing Horizontal: Position: Standing with busy background, Time: 30 seconds, Reps: 2, and Comment: pt reports no issue with this when a background is contained   Horizontal VOR 2 x 20' with gait, no unsteadiness noted with this  Holding ball and making CW/CCW circles during gait 3 x 20' each direction, pt more challenged when going up and to the L  On air  ex: Marching 3 x 10 reps with EC, incr postural sway  Holding ball with diagonals/trunk rotation 2 x 10 reps each side, focusing on ball for gaze stabilization, cued to incr trunk rotation  Holding ball and making CW and CCW circles 10 reps each direction with feet together on foam   On rockerboard in A/P direction: EC static stance 4 x 30 seconds, pt more stable with incr reps  EC head turns 2 x 10 reps, head nods 2 x 10 reps, intermittent taps to bars for balance Holding ball and making CW/CCW circles for gaze stabilization 10 reps each side    PATIENT EDUCATION: Education details: Adding gaze stabilization with gait with holding ball and making CW/CCW circles with ball   Person educated: Patient Education method: Explanation, Demonstration, and Verbal cues Education comprehension: verbalized understanding, returned demonstration, and verbal cues required  HOME EXERCISE PROGRAM: Standing VOR x30 seconds - progressed to   Access Code: [email or web address] URL: [email or web address] Date: 11/18/2023 Prepared by: Sherlie Ban  Exercises - Standing Balance with Eyes Closed on Foam  - 1-2 x daily - 5 x weekly - 3 sets - 30 hold - Wide Stance with Eyes Closed and Head Rotation on Foam Pad  - 1-2 x daily - 5 x weekly - 2 sets - 10 reps - Wide Stance with Eyes Closed and Head Nods on Foam Pad  - 1-2 x daily - 5 x weekly - 2 sets - 10 reps  GOALS: Goals reviewed with patient? Yes  SHORT TERM GOALS: ALL STGS = LTGS   LONG TERM GOALS: Target date: 12/08/2023  Pt will be independent with final vestibular HEP in order to build upon functional gains made in therapy.; Baseline:  Goal status: INITIAL  2.  Pt will improve DVA to 2 line difference or less in order to demo improved VOR.  Baseline: 4 line difference  Goal status: INITIAL  3.  Pt will improve condition 4 of mCTSIB to at least 20 seconds to demo improved vestibular input for balance.  Baseline:  Goal status:  INITIAL  4.  SOT to be assessed with goal written.  Baseline: all above normal, goal not needed  Goal status: N/A   ASSESSMENT:  CLINICAL IMPRESSION: Today's skilled session focused on progressing VOR tasks and EC tasks for vestibular input. With gaze stabilization tasks, pt more challenged with tracking ball going to the L. With EC on rockerboard, pt unable to hold for 30 seconds initially, but did improve with incr reps. Will continue per POC.    OBJECTIVE IMPAIRMENTS: Abnormal gait, decreased balance, and dizziness.   ACTIVITY LIMITATIONS: locomotion level  PARTICIPATION LIMITATIONS:  N/A  PERSONAL FACTORS: Age, Behavior pattern, Past/current experiences, Time since onset of injury/illness/exacerbation, and 1-2 comorbidities: A fib (on Eliquis), CKD, Mitral valve repair 08/29/2023, R total ankle replacement 2024, R quad tendon repair 2023, vestibular migraines  are also affecting patient's functional outcome.   REHAB POTENTIAL: Good  CLINICAL DECISION MAKING: Stable/uncomplicated  EVALUATION COMPLEXITY: Low   PLAN:  PT FREQUENCY: 1x/week  PT DURATION: 8 weeks  PLANNED INTERVENTIONS: 78295- PT Re-evaluation, 97110-Therapeutic  exercises, 97530- Therapeutic activity, [id number]- Neuromuscular re-education, [phone]- Self Care, [id number]- Manual therapy, Patient/Family education, Balance training, and Vestibular training  PLAN FOR NEXT SESSION: progress VOR tasks, vestibular input for balance, work on convergence, unlevel surfaces   Drake Leach, PT, DPT 12/01/2023, 8:57 AM

## 2023-12-02 ENCOUNTER — Ambulatory Visit (HOSPITAL_COMMUNITY)
Admission: RE | Admit: 2023-12-02 | Discharge: 2023-12-02 | Disposition: A | Discharge status: Home / Self Care | Payer: BLUE CROSS/BLUE SHIELD | Source: Ambulatory Visit | Attending: Cardiology | Admitting: Cardiology

## 2023-12-02 DIAGNOSIS — I082 Rheumatic disorders of both aortic and tricuspid valves: Secondary | ICD-10-CM | POA: Insufficient documentation

## 2023-12-02 DIAGNOSIS — I340 Nonrheumatic mitral (valve) insufficiency: Secondary | ICD-10-CM

## 2023-12-02 LAB — ECHOCARDIOGRAM COMPLETE
AR max vel: 3.03 cm2
AV Area VTI: 2.83 cm2
AV Area mean vel: 2.95 cm2
AV Mean grad: 2 mmHg
AV Peak grad: 3.5 mmHg
Ao pk vel: 0.94 m/s
Area-P 1/2: 4.29 cm2
Calc EF: 46.7 %
MV VTI: 2.09 cm2
S' Lateral: 3.6 cm
Single Plane A2C EF: 51.4 %
Single Plane A4C EF: 43.5 %

## 2023-12-06 ENCOUNTER — Telehealth (HOSPITAL_COMMUNITY): Payer: Self-pay | Admitting: Cardiology

## 2023-12-06 NOTE — Telephone Encounter (Signed)
Pt aware of echo results

## 2023-12-07 ENCOUNTER — Telehealth: Payer: Self-pay | Admitting: Neurology

## 2023-12-07 NOTE — Telephone Encounter (Signed)
 Pt said need prior authorization for Galcanezumab-gnlm [facility]) 120 MG/ML SOAJ sent to [facility] . Pt would like a call back to confirm prior authorization has been sent,.

## 2023-12-08 ENCOUNTER — Other Ambulatory Visit (HOSPITAL_COMMUNITY): Payer: Self-pay

## 2023-12-08 ENCOUNTER — Telehealth: Payer: Self-pay

## 2023-12-08 NOTE — Telephone Encounter (Signed)
 Please submit PA.

## 2023-12-08 NOTE — Telephone Encounter (Signed)
 Pharmacy Patient Advocate Encounter   Received notification from Physician's Office that prior authorization for Emgality 120MG /ML auto-injectors (migraine) is required/requested.   Insurance verification completed.   The patient is insured through [facility] .   Per test claim: PA required; PA submitted to above mentioned insurance via CoverMyMeds Key/confirmation #/EOC [id number] Status is pending

## 2023-12-09 ENCOUNTER — Ambulatory Visit: Payer: Medicare Other | Admitting: Physical Therapy

## 2023-12-09 ENCOUNTER — Encounter: Payer: Self-pay | Admitting: Physical Therapy

## 2023-12-09 DIAGNOSIS — R2681 Unsteadiness on feet: Secondary | ICD-10-CM | POA: Diagnosis not present

## 2023-12-09 DIAGNOSIS — R42 Dizziness and giddiness: Secondary | ICD-10-CM | POA: Diagnosis not present

## 2023-12-09 NOTE — Therapy (Signed)
 OUTPATIENT PHYSICAL THERAPY VESTIBULAR TREATMENT     Patient Name: Christopher Weber MRN: [id number] DOB:01-Jan-1946, 78 y.o., male Today's Date: 12/11/2023  END OF SESSION:    12/09/23 0851  PT Visits / Re-Eval  Visit Number 4  Number of Visits 5  Date for PT Re-Evaluation 01/09/24  Authorization  Authorization Type MEDICARE PART A AND B  PT Time Calculation  PT Start Time 0850  PT Stop Time 0930  PT Time Calculation (min) 40 min  PT - End of Session  Equipment Utilized During Treatment Gait belt  Activity Tolerance Patient tolerated treatment well  Behavior During Therapy WFL for tasks assessed/performed     Past Medical History:  Diagnosis Date   A-fib (HCC)    on Eliquis   Actinic keratoses    Arthritis    Back pain    low   CKD (chronic kidney disease)    Colon polyp 2015   Diverticulitis    Dyspnea    on exertion   Dysrhythmia    A. Fib   Fever blister    episodically   Generalized headaches    GERD (gastroesophageal reflux disease)    Hepatitis A 1985   History of hiatal hernia    Hypersomnia    Knee injuries 2002   left   Migraine headache with aura 08/29/2014   Narcolepsy without cataplexy 04/12/2013    MSLT with  MSL 4.4 minutes.    Pain    facial trigemonal   Rotator cuff (capsule) sprain 08/29/2014   Seborrhea    chronic   Severe mitral regurgitation    Tendinitis    left middle finger   Past Surgical History:  Procedure Laterality Date   CLIPPING OF ATRIAL APPENDAGE  08/29/2023   Procedure: CLIPPING OF ATRIAL APPENDAGE USING MEDTRONIC 5OMM CLIP[;  Surgeon: Eugenio Hoes, MD;  Location: [facility] OR;  Service: Open Heart Surgery;;   COLONOSCOPY  2011-last   every 5 years   FINGER SURGERY  2009/2010   trigger finger of left hand     HERNIA REPAIR  09/13/1961   right inguinal    LASIK  1998 or 99   MAZE N/A 08/29/2023   Procedure: MAZE;  Surgeon: Eugenio Hoes, MD;  Location: [facility] OR;  Service: Open Heart Surgery;  Laterality: N/A;    MITRAL VALVE REPAIR N/A 08/29/2023   Procedure: MITRAL VALVE REPAIR USING SIMULUS ANNULOPLASTY BAND;  Surgeon: Eugenio Hoes, MD;  Location: [facility] OR;  Service: Open Heart Surgery;  Laterality: N/A;   QUADRICEPS TENDON REPAIR Right 04/2022   REPAIR OF PATENT FORAMEN OVALE  08/29/2023   Procedure: CLOSURE OF PATENT FORAMEN OVALE;  Surgeon: Eugenio Hoes, MD;  Location: [facility] OR;  Service: Open Heart Surgery;;   RIGHT/LEFT HEART CATH AND CORONARY ANGIOGRAPHY N/A 08/19/2023   Procedure: RIGHT/LEFT HEART CATH AND CORONARY ANGIOGRAPHY;  Surgeon: Laurey Morale, MD;  Location: [facility] INVASIVE CV LAB;  Service: Cardiovascular;  Laterality: N/A;   TEE WITHOUT CARDIOVERSION N/A 06/24/2023   Procedure: TRANSESOPHAGEAL ECHOCARDIOGRAM;  Surgeon: Laurey Morale, MD;  Location: [facility] INVASIVE CV LAB;  Service: Cardiovascular;  Laterality: N/A;   TEE WITHOUT CARDIOVERSION N/A 08/29/2023   Procedure: TRANSESOPHAGEAL ECHOCARDIOGRAM;  Surgeon: Eugenio Hoes, MD;  Location: [facility] OR;  Service: Open Heart Surgery;  Laterality: N/A;   TONSILLECTOMY AND ADENOIDECTOMY  09/13/1958   TOTAL ANKLE REPLACEMENT Right 2022   TRIGGER FINGER RELEASE  08/31/2012   Procedure: MINOR RELEASE TRIGGER FINGER/A-1 PULLEY;  Surgeon: Wyn Forster., MD;  Location: [facility];  Service: Orthopedics;  Laterality: Right;  right long finger   UPPER GASTROINTESTINAL ENDOSCOPY     VASECTOMY     Patient Active Problem List   Diagnosis Date Noted   Postop check 09/05/2023   S/P MVR (mitral valve repair) 08/29/2023   Vestibular migraine 07/07/2023   Mitral valve insufficiency, acquired 11/06/2021   Paroxysmal atrial fibrillation (HCC) 06/18/2021   Benign fasciculations 02/10/2017   Narcolepsy 08/28/2015   Encounter for medication management 08/28/2015   Controlled narcolepsy 08/29/2014   Rotator cuff (capsule) sprain 08/29/2014   Migraine headache with aura 08/29/2014   Narcolepsy cataplexy syndrome 02/21/2014   Secondary  narcolepsy without cataplexy 04/12/2013    PCP: Cleatis Polka., MD   REFERRING PROVIDER: Dohmeier, Porfirio Mylar, MD   REFERRING DIAG: [phone] (ICD-10-CM) - Vestibular migraine R42 (ICD-10-CM) - Vertigo    THERAPY DIAG:  Dizziness and giddiness  Unsteadiness on feet  ONSET DATE: 11/03/2023  Rationale for Evaluation and Treatment: Rehabilitation  SUBJECTIVE:   SUBJECTIVE STATEMENT: Usually by the afternoon the feeling of walking on a bouncy house. Christopher Weber stop taking Prednisone on Monday. Gradually getting better.   Pt accompanied by: self  PERTINENT HISTORY: PMH: A fib (on Eliquis), Vestibular migraines, CKD, Mitral valve repair 08/29/2023, R total ankle replacement 2024, R quad tendon repair 2023  08/29/23: He underwent mitral valve repair utilizing a 34mm simulus annuloplasty band, MAZE, atrial appendage clipping utilizing a 50mm medtronic clip and closure of a patent foramen ovale.   He had neuro issues post discharge with vertigo and migraines and found by neurology to have 2mm subacute CVA.   Was found to have a small subacute CVA with MRI brain. Dr. Vickey Huger thinks that this happened when pt had his surgery in December 2024.    PAIN:  Are you having pain? "I'm 77, always, but nothing new"   There were no vitals filed for this visit.  Sitting, Standing   PRECAUTIONS: Fall  FALLS: Has patient fallen in last 6 months? Yes. 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   OBJECTIVE:  Note: Objective measures were completed at Evaluation unless otherwise noted.  DIAGNOSTIC FINDINGS: MRI brain 09/26/23: MRI brain: IMPRESSION: 1. 2 mm acute/subacute infarct within the left thalamus. 2.  Mild-to-moderate chronic small vessel ischemic changes within the cerebral white matter. 3. Chronic pontomedullary parenchymal microhemorrhage posteriorly. 4. Mild generalized parenchymal atrophy. 5. Minor paranasal sinus disease. 6. Trace fluid within the bilateral mastoid air cells.  COGNITION: Overall cognitive status: Within functional limits for tasks assessed   Cervical ROM:   WNL   GAIT: Gait pattern: WFL Distance walked: Clinic distances  Assistive device utilized: None Level of assistance: Modified independence Comments: No overt unsteadiness noted, pt reports wide BOS when feeling unsteady    VESTIBULAR ASSESSMENT:  GENERAL OBSERVATION: Ambulates in independently with no AD    SYMPTOM BEHAVIOR:  Subjective history: See above   Non-Vestibular symptoms: headaches and migraine symptoms  Type of dizziness: Imbalance (Disequilibrium), Spinning/Vertigo, and Unsteady with head/body turns  Frequency: Almost daily unsteady episodes where feels like in a canoe and needs to walk with wide BOS   Duration: Depends   Aggravating factors: Induced by position change: rolling  to the right, rolling to the left, and rolling too fast in the bed and Induced by motion: turning head quickly and feels like brain feels like it is slow when catching up (feels like this a lot, but does not go into a vertigo spell)  Relieving factors:  Symptoms get better as the day goes on   Progression of symptoms: better  OCULOMOTOR EXAM:  Ocular Alignment: normal  Ocular ROM: No Limitations  Spontaneous Nystagmus: absent  Gaze-Induced Nystagmus: absent  Smooth Pursuits: intact and at left lateral in horizontal direction, reported seeing 2 tips of pen (when it was closer)   Saccades: intact  Convergence/Divergence: 9 cm   VESTIBULAR - OCULAR REFLEX:   Slow VOR: Normal  VOR Cancellation: Normal  Head-Impulse Test: HIT Right: negative HIT Left: positive  Dynamic Visual Acuity: Static: Line  8 Dynamic: Line 4   POSITIONAL TESTING: Right Dix-Hallpike: no nystagmus Left Dix-Hallpike: no nystagmus Right Roll Test: no nystagmus Left Roll Test: no nystagmus  MOTION SENSITIVITY:  Motion Sensitivity Quotient Intensity: 0 = none, 1 = Lightheaded, 2 = Mild, 3 = Moderate, 4 = Severe, 5 = Vomiting  Intensity  1. Sitting to supine 0  2. Supine to L side 0  3. Supine to R side 0  4. Supine to sitting 0  5. L Hallpike-Dix 0  6. Up from L  0  7. R Hallpike-Dix 0  8. Up from R  0  9. Sitting, head tipped to L knee   10. Head up from L knee   11. Sitting, head tipped to R knee   12. Head up from R knee   13. Sitting head turns x5   14.Sitting head nods x5   15. In stance, 180 turn to L    16. In stance, 180 turn to R      M-CTSIB  Condition 1: Firm Surface, EO 30 Sec, Normal Sway  Condition 2: Firm Surface, EC 30 Sec, Mild Sway  Condition 3: Foam Surface, EO 30 Sec, Normal Sway  Condition 4: Foam Surface, EC 6 Sec                                                                                                                                TREATMENT DATE:   NMR:   DHI: 26 = mild handicap   Habituation to bending/turning with busy checkered background on floor:  On foam, picking up 5 cones, performed 2 sets, with incr speed  Performing 180 degree turn, picking up cone and then turning and placing it on other side, pt with some dizziness, more symptoms when going to the L  With busy background VOR x2 horizontal 30 seconds VOR x1 horizontal 2 x 30 seconds, faster speed, with pt feeling a little more off when going a little more quickly, discussed incr speed of VOR x1 when performing at home  On blue side of BOSU: static balance 30 seconds, 10  reps head turns, 10 reps head nods, pt unsteady with addition of head motions, needing frequent UE support for balance  On black side of BOSU:  10 reps head turns, 10 reps head nods, diagonal head movements 10 reps each  direction Holding ball and trunk rotation each side 10 reps, pt more unsteady with this, needing min A at times for balance  On thicker blue foam: Holding ball and performing trunk rotations and gaze stabilization to simulate golf swing, 10 reps each side  EO marching with head turns 10 reps EC marching 10 reps, pt challenged with this for balance EC 30 seconds EC 10 reps diagonal head movements in each direction   PATIENT EDUCATION: Education details: Working on faster speeds with VOR x1 with busy background, adding diagonal head movements with EC to HEP.  Person educated: Patient Education method: Explanation, Demonstration, and Verbal cues Education comprehension: verbalized understanding, returned demonstration, and verbal cues required  HOME EXERCISE PROGRAM: Standing VOR x30 seconds - progressed to busy background and faster speed of movement  Adding in CW/CCW circles with ball for gaze stabilizaiton   Access Code: [email or web address] URL: [email or web address] Date: 11/18/2023 Prepared by: Sherlie Ban  Exercises - Standing Balance with Eyes Closed on Foam  - 1-2 x daily - 5 x weekly - 3 sets - 30 hold - Wide Stance with Eyes Closed and Head Rotation on Foam Pad  - 1-2 x daily - 5 x weekly - 2 sets - 10 reps - Wide Stance with Eyes Closed and Head Nods on Foam Pad  - 1-2 x daily - 5 x weekly - 2 sets - 10 reps Can also add in diagonal head movements   GOALS: Goals reviewed with patient? Yes  SHORT TERM GOALS: ALL STGS = LTGS   LONG TERM GOALS: Target date: 12/08/2023  Pt will be independent with final vestibular HEP in order to build upon functional gains made in therapy.; Baseline:  Goal status: INITIAL  2.  Pt will improve DVA to 2 line difference or less in order to demo improved VOR.  Baseline: 4 line difference  Goal status: INITIAL  3.  Pt will improve condition 4 of mCTSIB to at least 20 seconds to demo improved vestibular input for balance.   Baseline:  Goal status: INITIAL  4.  SOT to be assessed with goal written.  Baseline: all above normal, goal not needed  Goal status: N/A   ASSESSMENT:  CLINICAL IMPRESSION: Pt filled out DHI at start of session and scored a 26, indicating mild handicap in regards to dizziness. Remainder of session focused on progressing VOR and vestibular tasks for balance. Pt more challenged with VOR x1 at quicker speeds and had mild symptoms afterwards, instructed to incr speed of performing at home. Pt most challenged by head and trunk rotation tasks on BOSU for balance. Will continue per POC.    OBJECTIVE IMPAIRMENTS: Abnormal gait, decreased balance, and dizziness.   ACTIVITY LIMITATIONS: locomotion level  PARTICIPATION LIMITATIONS:  N/A  PERSONAL FACTORS: Age, Behavior pattern, Past/current experiences, Time since onset of injury/illness/exacerbation, and 1-2 comorbidities: A fib (on Eliquis), CKD, Mitral valve repair 08/29/2023, R total ankle replacement 2024, R quad tendon repair 2023, vestibular migraines  are also affecting patient's functional outcome.   REHAB POTENTIAL: Good  CLINICAL DECISION MAKING: Stable/uncomplicated  EVALUATION COMPLEXITY: Low   PLAN:  PT FREQUENCY: 1x/week  PT DURATION: 8 weeks  PLANNED INTERVENTIONS: 97164- PT Re-evaluation, 97110-Therapeutic exercises, 97530- Therapeutic activity, [id number]- Neuromuscular re-education, 97535- Self Care,  16109- Manual therapy, Patient/Family education, Balance training, and Vestibular training  PLAN FOR NEXT SESSION: check goals, D/C?    Drake Leach, PT, DPT 12/11/2023, 8:29 PM

## 2023-12-12 NOTE — Telephone Encounter (Signed)
 Pharmacy Patient Advocate Encounter  Received notification from [facility] that Prior Authorization for Emgality 120MG /ML auto-injectors (migraine) has been DENIED.  Full denial letter will be uploaded to the media tab. See denial reason below.   PA #/Case ID/Reference #: N/A

## 2023-12-12 NOTE — Telephone Encounter (Signed)
 Received this denial on 12/02/2023. Completed a expedited appeal and submitted that on Friday 12/07/23. He meets the criteria and had previously been receiving the medication through the same insurance so unsure why denying. The patient was also in contact with his insurance company as well. Hopefully they will overturn decision.

## 2023-12-15 ENCOUNTER — Ambulatory Visit: Payer: Medicare Other | Attending: Neurology | Admitting: Physical Therapy

## 2023-12-15 ENCOUNTER — Encounter: Payer: Self-pay | Admitting: Physical Therapy

## 2023-12-15 VITALS — BP 115/82 | HR 86

## 2023-12-15 DIAGNOSIS — R2681 Unsteadiness on feet: Secondary | ICD-10-CM | POA: Diagnosis not present

## 2023-12-15 DIAGNOSIS — R42 Dizziness and giddiness: Secondary | ICD-10-CM | POA: Insufficient documentation

## 2023-12-15 NOTE — Therapy (Signed)
 OUTPATIENT PHYSICAL THERAPY VESTIBULAR TREATMENT     Patient Name: Christopher Weber MRN: [id number] DOB:1946/08/15, 78 y.o., male Today's Date: 12/15/2023  END OF SESSION:  PT End of Session - 12/15/23 0804     Visit Number 5    Number of Visits 6    Date for PT Re-Evaluation 01/09/24    Authorization Type MEDICARE PART A AND B    PT Start Time 0803    PT Stop Time 0838    PT Time Calculation (min) 35 min    Equipment Utilized During Treatment --    Activity Tolerance Patient tolerated treatment well    Behavior During Therapy [facility] for tasks assessed/performed              12/09/23 0851  PT Visits / Re-Eval  Visit Number 4  Number of Visits 5  Date for PT Re-Evaluation 01/09/24  Authorization  Authorization Type MEDICARE PART A AND B  PT Time Calculation  PT Start Time 0850  PT Stop Time 0930  PT Time Calculation (min) 40 min  PT - End of Session  Equipment Utilized During Treatment Gait belt  Activity Tolerance Patient tolerated treatment well  Behavior During Therapy WFL for tasks assessed/performed     Past Medical History:  Diagnosis Date   A-fib (HCC)    on Eliquis   Actinic keratoses    Arthritis    Back pain    low   CKD (chronic kidney disease)    Colon polyp 2015   Diverticulitis    Dyspnea    on exertion   Dysrhythmia    A. Fib   Fever blister    episodically   Generalized headaches    GERD (gastroesophageal reflux disease)    Hepatitis A 1985   History of hiatal hernia    Hypersomnia    Knee injuries 2002   left   Migraine headache with aura 08/29/2014   Narcolepsy without cataplexy 04/12/2013    MSLT with  MSL 4.4 minutes.    Pain    facial trigemonal   Rotator cuff (capsule) sprain 08/29/2014   Seborrhea    chronic   Severe mitral regurgitation    Tendinitis    left middle finger   Past Surgical History:  Procedure Laterality Date   CLIPPING OF ATRIAL APPENDAGE  08/29/2023   Procedure: CLIPPING OF ATRIAL APPENDAGE USING  MEDTRONIC 5OMM CLIP[;  Surgeon: Eugenio Hoes, MD;  Location: [facility] OR;  Service: Open Heart Surgery;;   COLONOSCOPY  2011-last   every 5 years   FINGER SURGERY  2009/2010   trigger finger of left hand     HERNIA REPAIR  09/13/1961   right inguinal    LASIK  1998 or 99   MAZE N/A 08/29/2023   Procedure: MAZE;  Surgeon: Eugenio Hoes, MD;  Location: [facility] OR;  Service: Open Heart Surgery;  Laterality: N/A;   MITRAL VALVE REPAIR N/A 08/29/2023   Procedure: MITRAL VALVE REPAIR USING SIMULUS ANNULOPLASTY BAND;  Surgeon: Eugenio Hoes, MD;  Location: [facility] OR;  Service: Open Heart Surgery;  Laterality: N/A;   QUADRICEPS TENDON REPAIR Right 04/2022   REPAIR OF PATENT FORAMEN OVALE  08/29/2023   Procedure: CLOSURE OF PATENT FORAMEN OVALE;  Surgeon: Eugenio Hoes, MD;  Location: [facility] OR;  Service: Open Heart Surgery;;   RIGHT/LEFT HEART CATH AND CORONARY ANGIOGRAPHY N/A 08/19/2023   Procedure: RIGHT/LEFT HEART CATH AND CORONARY ANGIOGRAPHY;  Surgeon: Laurey Morale, MD;  Location: [facility] INVASIVE CV LAB;  Service: Cardiovascular;  Laterality: N/A;   TEE WITHOUT CARDIOVERSION N/A 06/24/2023   Procedure: TRANSESOPHAGEAL ECHOCARDIOGRAM;  Surgeon: Laurey Morale, MD;  Location: [facility] INVASIVE CV LAB;  Service: Cardiovascular;  Laterality: N/A;   TEE WITHOUT CARDIOVERSION N/A 08/29/2023   Procedure: TRANSESOPHAGEAL ECHOCARDIOGRAM;  Surgeon: Eugenio Hoes, MD;  Location: [facility] OR;  Service: Open Heart Surgery;  Laterality: N/A;   TONSILLECTOMY AND ADENOIDECTOMY  09/13/1958   TOTAL ANKLE REPLACEMENT Right 2022   TRIGGER FINGER RELEASE  08/31/2012   Procedure: MINOR RELEASE TRIGGER FINGER/A-1 PULLEY;  Surgeon: Wyn Forster., MD;  Location: [facility];  Service: Orthopedics;  Laterality: Right;  right long finger   UPPER GASTROINTESTINAL ENDOSCOPY     VASECTOMY     Patient Active Problem List   Diagnosis Date Noted   Postop check 09/05/2023   S/P MVR (mitral valve repair) 08/29/2023    Vestibular migraine 07/07/2023   Mitral valve insufficiency, acquired 11/06/2021   Paroxysmal atrial fibrillation (HCC) 06/18/2021   Benign fasciculations 02/10/2017   Narcolepsy 08/28/2015   Encounter for medication management 08/28/2015   Controlled narcolepsy 08/29/2014   Rotator cuff (capsule) sprain 08/29/2014   Migraine headache with aura 08/29/2014   Narcolepsy cataplexy syndrome 02/21/2014   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 Played 9 roles of golf over the weekend. This week has not been as good as last week. Feels like he is on a bouncy house today. Did all of his exercises and they have been going well. Stopped taking his Prednisone on Sunday. Monday and Wednesday felt like he was getting some of the migraines. Picked up the [facility] yesterday and will take it on Sunday.   Pt accompanied by: self  PERTINENT HISTORY: PMH: A fib (on Eliquis), Vestibular migraines, CKD, Mitral valve repair 08/29/2023, R total ankle replacement 2024, R quad tendon repair 2023  08/29/23: He underwent mitral valve repair utilizing a 34mm simulus annuloplasty band, MAZE, atrial appendage clipping utilizing a 50mm medtronic clip and closure of a patent foramen ovale.   He had neuro issues post discharge with vertigo and migraines and found by neurology to have 2mm subacute CVA.   Was found to have a small subacute CVA with MRI brain. Dr. Vickey Huger thinks that this happened when pt had his surgery in December 2024.    PAIN:  Are you having pain? "I'm 78, always, but nothing new"   Vitals:   12/15/23 0810   BP: 115/82  Pulse: 86     PRECAUTIONS: Fall  FALLS: Has patient fallen in last 6 months? Yes. 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   OBJECTIVE:  Note: Objective measures were completed at Evaluation unless otherwise noted.  DIAGNOSTIC FINDINGS: MRI brain 09/26/23: MRI brain: IMPRESSION: 1. 2 mm acute/subacute infarct within the left thalamus. 2. Mild-to-moderate chronic small vessel ischemic changes within  the cerebral white matter. 3. Chronic pontomedullary parenchymal microhemorrhage posteriorly. 4. Mild generalized parenchymal atrophy. 5. Minor paranasal sinus disease. 6. Trace fluid within the bilateral mastoid air cells.  COGNITION: Overall cognitive status: Within functional limits for tasks assessed   Cervical ROM:   WNL   GAIT: Gait pattern: WFL Distance walked: Clinic distances  Assistive device utilized: None Level of assistance: Modified independence Comments: No overt unsteadiness noted, pt reports wide BOS when feeling unsteady    VESTIBULAR ASSESSMENT:  GENERAL OBSERVATION: Ambulates in independently with no AD    SYMPTOM BEHAVIOR:  Subjective history: See above   Non-Vestibular symptoms: headaches and migraine symptoms  Type of dizziness: Imbalance (Disequilibrium), Spinning/Vertigo, and Unsteady with head/body turns  Frequency: Almost daily unsteady episodes where feels like in a canoe and needs to walk with wide BOS   Duration: Depends   Aggravating factors: Induced by position change: rolling to the right, rolling to the left, and rolling too fast in the bed and Induced by motion: turning head quickly and feels like brain feels like it is slow when catching up (feels  like this a lot, but does not go into a vertigo spell)  Relieving factors:  Symptoms get better as the day goes on   Progression of symptoms: better  OCULOMOTOR EXAM:  Ocular Alignment: normal  Ocular ROM: No Limitations  Spontaneous Nystagmus: absent  Gaze-Induced Nystagmus: absent  Smooth Pursuits: intact and at left lateral in horizontal direction, reported seeing 2 tips of pen (when it was closer)   Saccades: intact  Convergence/Divergence: 9 cm   VESTIBULAR - OCULAR REFLEX:   Slow VOR: Normal  VOR Cancellation: Normal  Head-Impulse Test: HIT Right: negative HIT Left: positive  Dynamic Visual Acuity: Static: Line 8 Dynamic: Line 4   POSITIONAL TESTING: Right Dix-Hallpike: no nystagmus Left Dix-Hallpike: no nystagmus Right Roll Test: no nystagmus Left Roll Test: no nystagmus  MOTION SENSITIVITY:  Motion Sensitivity Quotient Intensity: 0 = none, 1 = Lightheaded, 2 = Mild, 3 = Moderate, 4 = Severe, 5 = Vomiting  Intensity  1. Sitting to supine 0  2. Supine to L side 0  3. Supine to R side 0  4. Supine to sitting 0  5. L Hallpike-Dix 0  6. Up from L  0  7. R Hallpike-Dix 0  8. Up from R  0  9. Sitting, head tipped to L knee   10. Head up from L knee   11. Sitting, head tipped to R knee   12. Head up from R knee   13. Sitting head turns x5   14.Sitting head nods x5   15. In stance, 180 turn to L    16. In stance, 180 turn to R      M-CTSIB  Condition 1: Firm Surface, EO 30 Sec, Normal Sway  Condition 2: Firm Surface, EC 30 Sec, Mild Sway  Condition 3: Foam Surface, EO 30 Sec, Normal Sway  Condition 4: Foam Surface, EC 6 Sec  TREATMENT DATE:   Therapeutic Activity:   Vitals:   12/15/23 0810  BP: 115/82  Pulse: 86    Condition 4 of mCTSIB: 30 seconds  Dynamic Visual Acuity: Static: Line 8 Dynamic: Line 4   [facility] PT  Assessment - 12/15/23 0815       Functional Gait  Assessment   Gait assessed  Yes    Gait Level Surface Walks 20 ft in less than 5.5 sec, no assistive devices, good speed, no evidence for imbalance, normal gait pattern, deviates no more than 6 in outside of the 12 in walkway width.    Change in Gait Speed Able to smoothly change walking speed without loss of balance or gait deviation. Deviate no more than 6 in outside of the 12 in walkway width.    Gait with Horizontal Head Turns Performs head turns smoothly with no change in gait. Deviates no more than 6 in outside 12 in walkway width    Gait with Vertical Head Turns Performs head turns with no change in gait. Deviates no more than 6 in outside 12 in walkway width.    Gait and Pivot Turn Pivot turns safely within 3 sec and stops quickly with no loss of balance.    Step Over Obstacle Is able to step over 2 stacked shoe boxes taped together (9 in total height) without changing gait speed. No evidence of imbalance.    Gait with Narrow Base of Support Ambulates less than 4 steps heel to toe or cannot perform without assistance.    Gait with Eyes Closed Walks 20 ft, no assistive devices, good speed, no evidence of imbalance, normal gait pattern, deviates no more than 6 in outside 12 in walkway width. Ambulates 20 ft in less than 7 sec.    Ambulating Backwards Walks 20 ft, no assistive devices, good speed, no evidence for imbalance, normal gait    Steps Alternating feet, no rail.    Total Score 27    FGA comment: 27/30            Gaze Adaptation: x1 Viewing Horizontal: Position: Standing with feet together on air ex, Time: 30 seconds, Reps: 2, and Comment: performed with busy background and incr speed, some postural sway  x1 Viewing Vertical: Position: Standing with feet together on air ex, Time: 30 seconds, Reps: 1, and Comment: performed with busy background and incr speed, some postural sway  Discussed progressing VOR to a busy background (pt  previously just doing a plain background) and standing on air ex   Reviewed entirety of HEP (see below) and added in slow tandem gait and gait with head motions (walking slower) as these were some items that pt was still feeling unsteady doing   PATIENT EDUCATION: Education details: Results of goals, reviewed HEP and also added in gait with head motions and tandem gait, will add 1 more appt in a few weeks after pt has a few weeks to work on exercises at home  Person educated: Patient Education method: Programmer, multimedia, Facilities manager, Verbal cues, and Handouts Education comprehension: verbalized understanding, returned demonstration, and verbal cues required  HOME EXERCISE PROGRAM: Standing VOR x30 seconds - progressed to busy background and faster speed of movement  Adding in CW/CCW circles with ball for gaze stabilizaiton   Access Code: [email or web address] URL: [email or web address] Date: 12/15/2023 Prepared by: Sherlie Ban  Exercises - Standing Balance with Eyes Closed on Foam  - 1-2 x daily - 5 x weekly - 3 sets - 30 hold - Wide Stance  with Eyes Closed and Head Rotation on Foam Pad  - 1-2 x daily - 5 x weekly - 2 sets - 10 reps - Wide Stance with Eyes Closed and Head Nods on Foam Pad  - 1-2 x daily - 5 x weekly - 2 sets - 10 reps Can also add in diagonal head movements  - Walking with Head Rotation  - 1-2 x daily - 5 x weekly - 3 sets - Tandem Walking with Counter Support  - 1 x daily - 5 x weekly - 3 sets   GOALS: Goals reviewed with patient? Yes  SHORT TERM GOALS: ALL STGS = LTGS   LONG TERM GOALS: Target date: 12/08/2023  Pt will be independent with final vestibular HEP in order to build upon functional gains made in therapy.; Baseline:  Goal status: IN PROGRESS  2.  Pt will improve DVA to 2 line difference or less in order to demo improved VOR.  Baseline: 4 line difference   4 line difference (4/3) Goal status: NOT MET  3.  Pt will improve condition 4 of mCTSIB to  at least 20 seconds to demo improved vestibular input for balance.  Baseline: 30 seconds  Goal status: MET  4.  SOT to be assessed with goal written.  Baseline: all above normal, goal not needed  Goal status: N/A  ON-GOING LTGS FOR 8 WEEK POC:  LONG TERM GOALS: Target date: 01/09/24  Pt will be independent with final vestibular HEP in order to build upon functional gains made in therapy.; Baseline:  Goal status: IN PROGRESS  2.  Pt will improve DVA to 2 line difference or less in order to demo improved VOR.  Baseline: 4 line difference   4 line difference (4/3) Goal status: ON-GOING  3. Pt will decr DHI score to an 18 or less in order to demo decr disability in regards to dizziness.  Baseline: 26, mild handicap in regards to dizziness    Goal status: NEW    ASSESSMENT:  CLINICAL IMPRESSION: Today's skilled session focused on assessing pt's LTGs. Pt has met LTG #3 - able to improve condition 4 of mCTSIB to 30 seconds (previously 6 seconds), indicating significantly improved vestibular input for balance. Pt continues with a 4 line difference on the DVA, indicating continued impaired VOR. Assessed FGA with pt scoring a 27/30, indicating a low fall risk. Pt only challenged with tandem gait. Pt also reporting feeling off balance with slower gait speed and head motions. Added these to pt's HEP as well as tandem gait. Discussed will go on hold for a few weeks while pt works on his exercises at home and will follow up in ~3 weeks before PT certification is over. Pt in agreement with plan. Educated on performing VOR x1 exercises at home with a busy background and incr speed as well as on foam. Will continue per POC, LTGs updated as appropriate.    OBJECTIVE IMPAIRMENTS: Abnormal gait, decreased balance, and dizziness.   ACTIVITY LIMITATIONS: locomotion level  PARTICIPATION LIMITATIONS:  N/A  PERSONAL FACTORS: Age, Behavior pattern, Past/current experiences, Time since onset of  injury/illness/exacerbation, and 1-2 comorbidities: A fib (on Eliquis), CKD, Mitral valve repair 08/29/2023, R total ankle replacement 2024, R quad tendon repair 2023, vestibular migraines  are also affecting patient's functional outcome.   REHAB POTENTIAL: Good  CLINICAL DECISION MAKING: Stable/uncomplicated  EVALUATION COMPLEXITY: Low   PLAN:  PT FREQUENCY: 1x/week  PT DURATION: 8 weeks  PLANNED INTERVENTIONS: 97164- PT Re-evaluation, 97110-Therapeutic exercises, 97530- Therapeutic  activity, [id number]- Neuromuscular re-education, [phone]- Self Care, 19147- Manual therapy, Patient/Family education, Balance training, and Vestibular training  PLAN FOR NEXT SESSION: how are things going after taking a break for a few weeks?    Drake Leach, PT, DPT 12/15/2023, 8:42 AM

## 2023-12-27 ENCOUNTER — Telehealth: Payer: Self-pay

## 2023-12-27 ENCOUNTER — Other Ambulatory Visit (HOSPITAL_COMMUNITY): Payer: Self-pay

## 2023-12-27 NOTE — Telephone Encounter (Signed)
 Pharmacy Patient Advocate Encounter   Received notification from CoverMyMeds that prior authorization for Sodium Oxybate 500MG /ML solution is required/requested.   Insurance verification completed.   The patient is insured through [facility] .   Per test claim: PA required; PA submitted to above mentioned insurance via CoverMyMeds Key/confirmation #/EOC [id number] Status is pending

## 2024-01-03 NOTE — Telephone Encounter (Signed)
 Christopher Weber from [facility] has called to report the approval for  Sodium Oxybate  500MG /ML solution dates 01-03-24/01-02-25 if there are questions their # is [phone] option 5

## 2024-01-05 ENCOUNTER — Other Ambulatory Visit (HOSPITAL_COMMUNITY): Payer: Self-pay

## 2024-01-05 ENCOUNTER — Ambulatory Visit (HOSPITAL_COMMUNITY)
Admission: RE | Admit: 2024-01-05 | Discharge: 2024-01-05 | Disposition: A | Discharge status: Home / Self Care | Source: Ambulatory Visit | Attending: Cardiology | Admitting: Cardiology

## 2024-01-05 ENCOUNTER — Encounter (HOSPITAL_COMMUNITY): Payer: Self-pay | Admitting: Cardiology

## 2024-01-05 VITALS — BP 110/70 | HR 84 | Wt 168.2 lb

## 2024-01-05 DIAGNOSIS — R0602 Shortness of breath: Secondary | ICD-10-CM | POA: Diagnosis not present

## 2024-01-05 DIAGNOSIS — K219 Gastro-esophageal reflux disease without esophagitis: Secondary | ICD-10-CM | POA: Diagnosis not present

## 2024-01-05 DIAGNOSIS — I340 Nonrheumatic mitral (valve) insufficiency: Secondary | ICD-10-CM | POA: Diagnosis not present

## 2024-01-05 DIAGNOSIS — Z9889 Other specified postprocedural states: Secondary | ICD-10-CM | POA: Diagnosis not present

## 2024-01-05 DIAGNOSIS — K5792 Diverticulitis of intestine, part unspecified, without perforation or abscess without bleeding: Secondary | ICD-10-CM | POA: Diagnosis not present

## 2024-01-05 DIAGNOSIS — R0600 Dyspnea, unspecified: Secondary | ICD-10-CM | POA: Insufficient documentation

## 2024-01-05 DIAGNOSIS — I2510 Atherosclerotic heart disease of native coronary artery without angina pectoris: Secondary | ICD-10-CM | POA: Insufficient documentation

## 2024-01-05 DIAGNOSIS — Z79899 Other long term (current) drug therapy: Secondary | ICD-10-CM | POA: Insufficient documentation

## 2024-01-05 DIAGNOSIS — Z860100 Personal history of colon polyps, unspecified: Secondary | ICD-10-CM | POA: Diagnosis not present

## 2024-01-05 DIAGNOSIS — I480 Paroxysmal atrial fibrillation: Secondary | ICD-10-CM | POA: Diagnosis not present

## 2024-01-05 LAB — CBC
HCT: 43.3 % (ref 39.0–52.0)
Hemoglobin: 15.1 g/dL (ref 13.0–17.0)
MCH: 30.5 pg (ref 26.0–34.0)
MCHC: 34.9 g/dL (ref 30.0–36.0)
MCV: 87.5 fL (ref 80.0–100.0)
Platelets: 238 10*3/uL (ref 150–400)
RBC: 4.95 MIL/uL (ref 4.22–5.81)
RDW: 13.4 % (ref 11.5–15.5)
WBC: 5.4 10*3/uL (ref 4.0–10.5)
nRBC: 0 % (ref 0.0–0.2)

## 2024-01-05 LAB — BASIC METABOLIC PANEL WITH GFR
Anion gap: 9 (ref 5–15)
BUN: 16 mg/dL (ref 8–23)
CO2: 24 mmol/L (ref 22–32)
Calcium: 9.6 mg/dL (ref 8.9–10.3)
Chloride: 102 mmol/L (ref 98–111)
Creatinine, Ser: 1.34 mg/dL — ABNORMAL HIGH (ref 0.61–1.24)
GFR, Estimated: 55 mL/min — ABNORMAL LOW (ref >60)
Glucose, Bld: 98 mg/dL (ref 70–99)
Potassium: 4.3 mmol/L (ref 3.5–5.1)
Sodium: 135 mmol/L (ref 135–145)

## 2024-01-05 LAB — BRAIN NATRIURETIC PEPTIDE: B Natriuretic Peptide: 40.7 pg/mL (ref 0.0–100.0)

## 2024-01-05 MED ORDER — SPIRONOLACTONE 25 MG PO TABS: 12.5000 mg | ORAL_TABLET | Freq: Every day | ORAL | 3 refills | Status: DC

## 2024-01-05 MED ORDER — METOPROLOL SUCCINATE ER 25 MG PO TB24: 12.5000 mg | ORAL_TABLET | Freq: Every day | ORAL | 3 refills | Status: DC

## 2024-01-05 NOTE — Patient Instructions (Signed)
 Medication Changes:  START: METOPROLOL  SUCCINATE 12.5MG  ONCE DAILY   START SPIRONOLACTONE  12.5MG  ONCE DAILY   Lab Work:  Labs done today, your results will be available in MyChart, we will contact you for abnormal readings.  THEN RETURN IN 10 DAYS AS SCHEDULED FOR REPEAT LABS  Follow-Up in: 3 MONTHS PLEASE CALL OUR OFFICE AROUND LATE MAY TO GET SCHEDULED FOR YOUR APPOINTMENT. PHONE NUMBER IS [phone] OPTION 2   At the [facility], you and your health needs are our priority. We have a designated team specialized in the treatment of Heart Failure. This Care Team includes your primary Heart Failure Specialized Cardiologist (physician), Advanced Practice Providers (APPs- Physician Assistants and Nurse Practitioners), and Pharmacist who all work together to provide you with the care you need, when you need it.   You may see any of the following providers on your designated Care Team at your next follow up:  Dr. Jules Oar Dr. Peder Bourdon Dr. Alwin Baars Dr. Judyth Nunnery Nieves Bars, NP Ruddy Corral, [place] [facility] [place], [place] Dennise Fitz, NP [place] Lee, NP Luster Salters, PharmD   Please be sure to bring in all your medications bottles to every appointment.   Need to Contact Us :  If you have any questions or concerns before your next appointment please send us  a message through Roy or call our office at [phone].    TO LEAVE A MESSAGE FOR THE NURSE SELECT OPTION 2, PLEASE LEAVE A MESSAGE INCLUDING: YOUR NAME DATE OF BIRTH CALL BACK NUMBER REASON FOR CALL**this is important as we prioritize the call backs  YOU WILL RECEIVE A CALL BACK THE SAME DAY AS LONG AS YOU CALL BEFORE 4:00 PM

## 2024-01-05 NOTE — Telephone Encounter (Signed)
 Pharmacy Patient Advocate Encounter  Received notification from [facility] that Prior Authorization for Sodium Oxybate  500MG /ML solution has been APPROVED from 01/03/2024 to 01/02/2025   PA #/Case ID/Reference #: PA Case ID #: [id number]

## 2024-01-06 ENCOUNTER — Encounter: Payer: Self-pay | Admitting: Physical Therapy

## 2024-01-06 ENCOUNTER — Ambulatory Visit: Payer: Self-pay | Admitting: Physical Therapy

## 2024-01-06 DIAGNOSIS — R2681 Unsteadiness on feet: Secondary | ICD-10-CM | POA: Diagnosis not present

## 2024-01-06 DIAGNOSIS — R42 Dizziness and giddiness: Secondary | ICD-10-CM

## 2024-01-06 NOTE — Therapy (Signed)
 OUTPATIENT PHYSICAL THERAPY VESTIBULAR TREATMENT/DISCHARGE SUMMARY     Patient Name: Christopher Weber MRN: [id number] DOB:05-Jan-1946, 78 y.o., male Today's Date: 01/06/2024  END OF SESSION:  PT End of Session - 01/06/24 0803     Visit Number 6    Number of Visits 6    Date for PT Re-Evaluation 01/09/24    Authorization Type MEDICARE PART A AND B    PT Start Time 0802    PT Stop Time 0820   full time not used due to D/C visit   PT Time Calculation (min) 18 min    Activity Tolerance Patient tolerated treatment well    Behavior During Therapy WFL for tasks assessed/performed              Past Medical History:  Diagnosis Date   A-fib (HCC)    on Eliquis    Actinic keratoses    Arthritis    Back pain    low   CKD (chronic kidney disease)    Colon polyp 2015   Diverticulitis    Dyspnea    on exertion   Dysrhythmia    A. Fib   Fever blister    episodically   Generalized headaches    GERD (gastroesophageal reflux disease)    Hepatitis A 1985   History of hiatal hernia    Hypersomnia    Knee injuries 2002   left   Migraine headache with aura 08/29/2014   Narcolepsy without cataplexy 04/12/2013    MSLT with  MSL 4.4 minutes.    Pain    facial trigemonal   Rotator cuff (capsule) sprain 08/29/2014   Seborrhea    chronic   Severe mitral regurgitation    Tendinitis    left middle finger   Past Surgical History:  Procedure Laterality Date   CLIPPING OF ATRIAL APPENDAGE  08/29/2023   Procedure: CLIPPING OF ATRIAL APPENDAGE USING MEDTRONIC 5OMM CLIP[;  Surgeon: Melene Sportsman, MD;  Location: [facility] OR;  Service: Open Heart Surgery;;   COLONOSCOPY  2011-last   every 5 years   FINGER SURGERY  2009/2010   trigger finger of left hand     HERNIA REPAIR  09/13/1961   right inguinal    LASIK  1998 or 99   MAZE N/A 08/29/2023   Procedure: MAZE;  Surgeon: Melene Sportsman, MD;  Location: [facility] OR;  Service: Open Heart Surgery;  Laterality: N/A;   MITRAL VALVE REPAIR N/A  08/29/2023   Procedure: MITRAL VALVE REPAIR USING SIMULUS ANNULOPLASTY BAND;  Surgeon: Melene Sportsman, MD;  Location: [facility] OR;  Service: Open Heart Surgery;  Laterality: N/A;   QUADRICEPS TENDON REPAIR Right 04/2022   REPAIR OF PATENT FORAMEN OVALE  08/29/2023   Procedure: CLOSURE OF PATENT FORAMEN OVALE;  Surgeon: Melene Sportsman, MD;  Location: [facility] OR;  Service: Open Heart Surgery;;   RIGHT/LEFT HEART CATH AND CORONARY ANGIOGRAPHY N/A 08/19/2023   Procedure: RIGHT/LEFT HEART CATH AND CORONARY ANGIOGRAPHY;  Surgeon: Darlis Eisenmenger, MD;  Location: [facility] INVASIVE CV LAB;  Service: Cardiovascular;  Laterality: N/A;   TEE WITHOUT CARDIOVERSION N/A 06/24/2023   Procedure: TRANSESOPHAGEAL ECHOCARDIOGRAM;  Surgeon: Darlis Eisenmenger, MD;  Location: [facility] INVASIVE CV LAB;  Service: Cardiovascular;  Laterality: N/A;   TEE WITHOUT CARDIOVERSION N/A 08/29/2023   Procedure: TRANSESOPHAGEAL ECHOCARDIOGRAM;  Surgeon: Melene Sportsman, MD;  Location: [facility] OR;  Service: Open Heart Surgery;  Laterality: N/A;   TONSILLECTOMY AND ADENOIDECTOMY  09/13/1958   TOTAL ANKLE REPLACEMENT Right 2022   TRIGGER FINGER RELEASE  08/31/2012   Procedure: MINOR RELEASE TRIGGER FINGER/A-1 PULLEY;  Surgeon: Amelie Baize., MD;  Location: [facility];  Service: Orthopedics;  Laterality: Right;  right long finger   UPPER GASTROINTESTINAL ENDOSCOPY     VASECTOMY     Patient Active Problem List   Diagnosis Date Noted   Postop check 09/05/2023   S/P MVR (mitral valve repair) 08/29/2023   Vestibular migraine 07/07/2023   Mitral valve insufficiency, acquired 11/06/2021   Paroxysmal atrial fibrillation (HCC) 06/18/2021   Benign fasciculations 02/10/2017   Narcolepsy 08/28/2015   Encounter for medication management 08/28/2015   Controlled narcolepsy 08/29/2014   Rotator cuff (capsule) sprain 08/29/2014   Migraine headache with aura 08/29/2014   Narcolepsy cataplexy syndrome 02/21/2014   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 Has been doing better. On Wednesday, took a Nurtec and felt normal for the whole rest of the day. Yesterday, felt a little wobbly in the morning and then felt better the rest of the day. Got off the metropolol about 2 weeks ago and now is going back on it. Still staying on the Eliquis . Has been doing his exercises. Pt overall reports he is doing better.   Pt accompanied by: self  PERTINENT HISTORY: PMH: A fib (on Eliquis ), Vestibular migraines, CKD, Mitral valve repair 08/29/2023, R total ankle replacement 2024, R quad tendon repair 2023  08/29/23: He underwent mitral valve repair utilizing a 34mm simulus annuloplasty band, MAZE, atrial appendage clipping utilizing a 50mm medtronic clip and closure of a patent foramen ovale.   He had neuro issues post discharge with vertigo and migraines and found by neurology to have 2mm subacute CVA.   Was found to have a small subacute CVA with MRI brain. Dr. Albertina Hugger thinks that this happened when pt had his surgery in December 2024.    PAIN:  Are you having pain? "I'm 78, always, but nothing new"   There were no vitals filed for this visit.    PRECAUTIONS: Fall  FALLS: Has patient fallen in last 6 months? Yes. 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    OBJECTIVE:  Note: Objective measures were completed at Evaluation unless otherwise noted.  DIAGNOSTIC FINDINGS: MRI brain 09/26/23: MRI brain: IMPRESSION: 1. 2 mm acute/subacute infarct within the left thalamus. 2. Mild-to-moderate chronic small vessel ischemic changes within the cerebral white matter. 3. Chronic pontomedullary parenchymal microhemorrhage posteriorly. 4. Mild generalized parenchymal atrophy. 5. Minor paranasal sinus disease. 6. Trace fluid within the bilateral mastoid air cells.  COGNITION: Overall cognitive status: Within functional limits for tasks assessed   Cervical ROM:   WNL   GAIT: Gait pattern: WFL Distance walked: Clinic distances  Assistive device utilized: None Level of assistance: Modified independence Comments: No overt unsteadiness noted, pt reports wide BOS when feeling unsteady    VESTIBULAR ASSESSMENT:  GENERAL OBSERVATION: Ambulates in independently with no AD    SYMPTOM BEHAVIOR:  Subjective history: See above   Non-Vestibular symptoms: headaches and migraine symptoms  Type of dizziness: Imbalance (Disequilibrium), Spinning/Vertigo, and Unsteady with head/body turns  Frequency: Almost daily unsteady episodes where feels like in a canoe and needs to walk with wide BOS   Duration: Depends   Aggravating factors: Induced by position change: rolling to the right, rolling to the left, and rolling too fast in the bed and Induced by motion: turning head quickly and feels like brain feels like it is slow when catching up (feels like this a lot, but does not go into a vertigo spell)  Relieving factors:  Symptoms get better as the day goes on   Progression of symptoms: better  OCULOMOTOR EXAM:  Ocular Alignment: normal  Ocular ROM: No Limitations  Spontaneous Nystagmus: absent  Gaze-Induced Nystagmus: absent  Smooth Pursuits: intact and at left lateral in horizontal direction, reported seeing 2 tips of pen (when it was closer)   Saccades:  intact  Convergence/Divergence: 9 cm   VESTIBULAR - OCULAR REFLEX:   Slow VOR: Normal  VOR Cancellation: Normal  Head-Impulse Test: HIT Right: negative HIT Left: positive  Dynamic Visual Acuity: Static: Line 8 Dynamic: Line 4   POSITIONAL TESTING: Right Dix-Hallpike: no nystagmus Left Dix-Hallpike: no nystagmus Right Roll Test: no nystagmus Left Roll Test: no nystagmus  MOTION SENSITIVITY:  Motion Sensitivity Quotient Intensity: 0 = none, 1 = Lightheaded, 2 = Mild, 3 = Moderate, 4 = Severe, 5 = Vomiting  Intensity  1. Sitting to supine 0  2. Supine to L side 0  3. Supine to R side 0  4. Supine to sitting 0  5. L Hallpike-Dix 0  6. Up from L  0  7. R Hallpike-Dix 0  8. Up from R  0  9. Sitting, head tipped to L knee   10. Head up from L knee   11. Sitting, head tipped to R knee   12. Head up from R knee   13. Sitting head turns x5   14.Sitting head nods x5   15. In stance, 180 turn to L    16. In stance, 180 turn to R                                                                                                                                   TREATMENT DATE:   Therapeutic Activity:   DHI: 18, mild handicap in regards to dizziness   Dynamic Visual Acuity: Static: Line 8 Dynamic: Line 3 3 line difference   Reviewed HEP with pt consistently performing and has no questions at this time (see HEP section below)  PATIENT EDUCATION: Education details: Results of goals, D/C from PT, continue HEP  Person educated: Patient Education method: Medical illustrator Education comprehension: verbalized understanding, returned demonstration, and verbal cues required  HOME EXERCISE PROGRAM: Standing VOR  x30 seconds - progressed to busy background and faster speed of movement  Adding in CW/CCW circles with ball for gaze stabilizaiton   Access Code: [email or web address] URL: [email or web address] Date: 12/15/2023 Prepared by: Jonathan Neighbor  Exercises - Standing Balance with Eyes Closed on Foam  - 1-2 x daily - 5 x weekly - 3 sets - 30 hold - Wide Stance with Eyes Closed and Head Rotation on Foam Pad  - 1-2 x daily - 5 x weekly - 2 sets - 10 reps - Wide Stance with Eyes Closed and Head Nods on Foam Pad  - 1-2 x daily - 5 x weekly - 2 sets - 10 reps Can also add in diagonal head movements  - Walking with Head Rotation  - 1-2 x daily - 5 x weekly - 3 sets - Tandem Walking with Counter Support  - 1 x daily - 5 x weekly - 3 sets   PHYSICAL THERAPY DISCHARGE SUMMARY  Visits from Start of Care: 6  Current functional level related to goals / functional outcomes: See LTGs/Clinical Assessment Statement   Remaining deficits: Intermittent dizziness    Education / Equipment: HEP   Patient agrees to discharge. Patient goals were met. Patient is being discharged due to meeting the stated rehab goals. And pt pleased with current functional level and progress    GOALS: Goals reviewed with patient? Yes  SHORT TERM GOALS: ALL STGS = LTGS   LONG TERM GOALS: Target date: 12/08/2023  Pt will be independent with final vestibular HEP in order to build upon functional gains made in therapy.; Baseline:  Goal status: IN PROGRESS  2.  Pt will improve DVA to 2 line difference or less in order to demo improved VOR.  Baseline: 4 line difference   4 line difference (4/3) Goal status: NOT MET  3.  Pt will improve condition 4 of mCTSIB to at least 20 seconds to demo improved vestibular input for balance.  Baseline: 30 seconds  Goal status: MET  4.  SOT to be assessed with goal written.  Baseline: all above normal, goal not needed  Goal status: N/A  ON-GOING LTGS FOR 8 WEEK POC:  LONG TERM GOALS: Target date: 01/09/24  Pt will be independent with final vestibular HEP in order to build upon functional gains made in therapy.; Baseline: pt consistent with HEP  Goal status: MET  2.  Pt will improve DVA to 2 line difference  or less in order to demo improved VOR.  Baseline: 4 line difference   4 line difference (4/3)  3 line difference (4/25) Goal status: NOT MET  3. Pt will decr DHI score to an 18 or less in order to demo decr disability in regards to dizziness.  Baseline: 26, mild handicap in regards to dizziness       18, mild handicap  Goal status: MET    ASSESSMENT:  CLINICAL IMPRESSION: Pt returns to PT after taking a break for a few weeks to work on his exercises at home. Pt reports that he is pleased with his progress with therapy and is consistent with his HEP and is ready for D/C. Pt has met LTGs #1 and #3. Pt improved score on DHI indicating improvement with dizziness. Pt improved DVA to 3 line difference (previously 4 lines), but not quite to goal level. Pt is ready for D/C based on progress.    OBJECTIVE IMPAIRMENTS: Abnormal gait, decreased balance, and dizziness.   ACTIVITY LIMITATIONS: locomotion level  PARTICIPATION LIMITATIONS:  N/A  PERSONAL FACTORS: Age, Behavior pattern, Past/current experiences, Time since onset of injury/illness/exacerbation, and 1-2 comorbidities: A fib (on Eliquis ), CKD, Mitral valve repair 08/29/2023, R total ankle replacement 2024, R quad tendon repair 2023, vestibular migraines  are also affecting patient's functional outcome.   REHAB POTENTIAL: Good  CLINICAL DECISION MAKING: Stable/uncomplicated  EVALUATION COMPLEXITY: Low   PLAN:  PT FREQUENCY: 1x/week  PT DURATION: 8 weeks  PLANNED INTERVENTIONS: 97164- PT Re-evaluation, 97110-Therapeutic exercises, 97530- Therapeutic activity, [id number]- Neuromuscular re-education, 97535- Self Care, 78295- Manual therapy, Patient/Family education, Balance training, and Vestibular training  PLAN FOR NEXT SESSION: D/C   Seabron Cypress, PT, DPT 01/06/2024, 8:24 AM

## 2024-01-06 NOTE — Progress Notes (Signed)
 ADVANCED HF CLINIC NOTE   PCP: Jeannine Milroy., MD Cardiology: Dr. Mitzie Anda  Chief Complaint: Mitral Regurgitation Follow-up  HPI: 78 y.o. with history of narcolepsy and laryngopharyngeal reflux, mitral regurgitation and atrial fibrillation.  In early 2020, he developed dyspnea, a sensation that he was "breathing through a straw." He had an extensive workup in 2020 with a normal ETT, normal PFTs, and normal high resolution CT chest.  Ultimately, he was diagnosed with laryngopharyngeal reflux. Symptoms were improved considerably with Protonix  + Zegerid.    With symptoms of LPR controlled, he did not have any exertional dyspnea or chest pain. In 11/21, however, he cut back on Protonix  and Zegerid, and dyspnea returned.  In addition, on 11/31/21, he developed tachypalpitations.  His Apple Watch showed atrial fibrillation. He brought the tracings to show me, they were definitely atrial fibrillation. This lasted for a few hours when resolved.  In hindsight, in 4/20, he had a very similar episode of tachypalpitations that was likely atrial fibrillation as well.  He has increased his Protonix  and Zegerid again and dyspnea has resolved.  He had atrial fibrillation again on 08/30/20 documented by Apple Watch.  He does not have OSA symptoms and is followed by Dr. Albertina Hugger for narcolepsy, which is controlled.    Echo 12/21, EF 55-60%, mild mitral valve prolapse with probably mild MR.    Coronary CTA was done in 3/22.  This was a difficult study but calcium  score 0 and no evidence for significant disease.  CT neck showed asymmetric right vallecula enlargement.  He saw ENT with examination that showed no evidence for malignancy.  He had total ankle replacement in 5/22.   Patient had diverticulitis in 9/22, treated as outpatient.   Patient tore his quadriceps tendon in 8/23 and had recent surgery to repair it.  He had a run of atrial fibrillation lasting 3-4 hours about 3 days after surgery.    Echo 8/23  EF 55%, normal RV, moderate MR with posterior leaflet prolapse.    Zio 6/24 showed no definite atrial fibrillation noted. Sleep study 5/24 showed mild sleep apnea.    Echo in 9/24 showed EF 50-55% with low normal RV function, there was mitral valve prolapse with highly eccentric mitral regurgitation that is likely severe.   L/RHC (12/24) showed minimal nonobstructive CAD; RA 3, PA 18/6 (13), CO/CI (Fick) 4.18/2.25  Admitted 12/17-12/20/24 for mitral valve repair, MAZE, LAA clip, and closure of PFO with Dr. Honey Lusty.   Post-op echo in 3/25 showed EF 45-50%, normal RV, s/p MV repair with trivial MR and no mitral stenosis.   Patient returns for followup of mitral regurgitation and atrial fibrillation.  He has had trouble with migraines post-op MV repair, this is now getting better.  He is working with neurology and doing neuro rehab.  No exertional dyspnea or chest pain.  Using recumbent bike, lifting weights.  Went to [employer] with grandchildren and walked 8 miles one day. He stopped Toprol  XL on his own.  No lightheadedness or palpitations.   EKG (personally reviewed): NSR, right superior axis  Labs (9/21): K 4.7, creatinine 1.42, TSH normal, hgb 14.2 Labs (12/21): BNP 17 Labs (2/22): LDL 141 Labs (5/22): K 4.5, creatinine 1.3, hgb 11.1 Labs (8/22): LDL 108 Labs (10/22): creatinine 1.29, hgb 14.5 Labs (8/23): K 3.8, creatinine 1.13 Labs (4/24): K 4.5, SCr 1.32 Labs (8/24): K 4.3, creatinine 1.1, LDL 99, TGs 50, hgb 14 Labs(12/24): K 4.5, creatinine 1.08  PMH: 1. HAV in 1985  2. Narcolepsy 3. Laryngopharyngeal reflux 4. Atrial fibrillation: Paroxysmal.  - Zio 6/24: no definite a fib noted - Sleep study 5/24: mild sleep apnea 5. ETT (10/20): 11.6 METS, no ECG changes => normal study.  6. PFTs (10/20): Normal.  7. High resolution CT chest: Interstitial lung disease.  8. Mitral regurgitation: Echo (12/21) with EF 55-60%, mild mitral valve prolapse with probable mild MR - Echo  (8/23): EF 55%, normal RV, moderate MR with posterior leaflet prolapse.  - Echo (9/24): 50-55% with low normal RV function, there is mitral valve prolapse with highly eccentric mitral regurgitation that is likely severe.  - 12/24 s/p Mitral valve repair with LAA clip, PFO closure.  - Echo (3/25): EF 45-50%, normal RV, s/p MV repair with trivial MR and no mitral stenosis.  9. Coronary CTA (3/22): Calcium  score 0, no evidence for significant CAD.  - LHC (12/24): minimal nonobstructive CAD.  10. S/p total ankle replacement in 5/22.  11. Diverticulitis 9/22 12. 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   FH: No atrial fibrillation.  Father had mitral regurgitation.   ROS: All systems reviewed and negative except as per HPI.   Current Outpatient Medications  Medication Sig Dispense Refill   acetaminophen  (TYLENOL ) 325 MG tablet Take 325 mg by mouth every 6 (six) hours as needed for moderate pain.     apixaban  (ELIQUIS ) 5 MG TABS tablet Take 1 tablet (5 mg total) by mouth 2 (two) times daily. 180 tablet 3   CALCIUM -MAGNESIUM -ZINC PO Take 1 tablet by mouth 3 (three) times a week.     fluticasone  (FLONASE ) 50 MCG/ACT nasal spray Place 1 spray into both nostrils daily as needed for allergies or rhinitis.     Galcanezumab -gnlm (EMGALITY ) 120 MG/ML SOAJ Inject 120 mg into the skin every 30 (thirty) days.     Multiple Vitamin (MULTIVITAMIN) capsule Take 1 capsule by mouth 3 (three) times a week.     multivitamin-lutein (OCUVITE-LUTEIN) CAPS capsule Take 1 capsule by mouth daily.     Omeprazole-Sodium Bicarbonate  (ZEGERID) 20-1100 MG CAPS capsule Take 1-2 capsules by mouth at bedtime.     pantoprazole  (PROTONIX ) 40 MG tablet Take 20 mg by mouth daily.     polyethylene glycol (MIRALAX  / GLYCOLAX ) 17 g packet Take 17 g by mouth See admin instructions. Take 17 g daily every night except skip dose on Sundays     Probiotic Product (ALIGN) 4 MG CAPS Take 4  mg by mouth 3 (three) times a week.     Rimegepant Sulfate  (NURTEC) 75 MG TBDP Take 1 tablet (75 mg total) by mouth as needed. 4 tablet 0   sildenafil (REVATIO) 20 MG tablet Take 40 mg by mouth daily as needed (ED).     Sodium Oxybate  500 MG/ML SOLN Take 2 grams at first dose and 2-3 hours later take 3 grams. 120 mL 3   spironolactone  (ALDACTONE ) 25 MG tablet Take 0.5 tablets (12.5 mg total) by mouth daily. 45 tablet 3   valACYclovir  (VALTREX ) 1000 MG tablet Take 500 mg by mouth every Monday, Wednesday, and Friday.     VITAMIN D  PO Take 1 tablet by mouth daily.     zaleplon  (SONATA ) 10 MG capsule Take 10 mg by mouth as needed (when flying overseas).     metoprolol  succinate (TOPROL -XL) 25 MG 24 hr tablet Take 0.5 tablets (12.5 mg total) by mouth daily. 45 tablet 3   No current facility-administered medications for this encounter.  BP 110/70   Pulse 84   Wt 76.3 kg (168 lb 3.2 oz)   SpO2 98%   BMI 26.34 kg/m   Filed Weights   01/05/24 1159  Weight: 76.3 kg (168 lb 3.2 oz)   General: NAD Neck: No JVD, no thyromegaly or thyroid  nodule.  Lungs: Clear to auscultation bilaterally with normal respiratory effort. CV: Nondisplaced PMI.  Heart regular S1/S2, no S3/S4, no murmur.  No peripheral edema.  No carotid bruit.  Normal pedal pulses.  Abdomen: Soft, nontender, no hepatosplenomegaly, no distention.  Skin: Intact without lesions or rashes.  Neurologic: Alert and oriented x 3.  Psych: Normal affect. Extremities: No clubbing or cyanosis.  HEENT: Normal.   Assessment/plan: 1. Atrial fibrillation: Paroxysmal.  CHADSVASC 1 (age).  Only mild OSA on sleep study (followed by Dr. Albertina Hugger already for narcolepsy).  Not a heavy drinker.  Weight is ideal.  Echo in 9/24 showed severe MR with mitral valve prolapse.  The mitral valve disease may be driving this atrial fibrillation. Now s/p MAZE and LAA clip 12/24.  NSR today.  - On Eliquis  5 mg bid 2. HF with intermediate range EF: Echo post-MV  repair showed some drop in EF, likely related to underlying damage from long-standing mitral regurgitation.  Post-op echo in 3/25 showed EF 45-50%, normal RV, s/p MV repair with trivial MR and no mitral stenosis.  - Restart Toprol  XL 12.5 daily.  - Add low dose spironolactone  12.5 mg daily.  BMET/BNP today, BMET in 10 days.  - Repeat the echo in 6 months.  3. Mitral valve prolapse/MR: Father had mitral regurgitation. Moderate on echo in 8/23. Echo 9/24 echo showed EF in the 50-55% range with mitral valve prolapse and probably severe highly eccentric MR. TEE 12/24 showed severe MR with flail P1 and highly eccentric jet with small PFO. [facility] 12/24 with minimal CAD and normal filling pressure. He is now s/p MV repair + MAZE + LAA clip and PFO closure on 08/30/23 (indication for repairing MR is EF < 60% and paroxysmal atrial fibrillation).   Follow up 3 months.  I spent 32 minutes reviewing records, interviewing/examining patient, and managing orders.   Peder Bourdon, MD 01/06/2024

## 2024-01-09 ENCOUNTER — Encounter: Payer: Self-pay | Admitting: Neurology

## 2024-01-13 ENCOUNTER — Telehealth (HOSPITAL_COMMUNITY): Payer: Self-pay

## 2024-01-13 ENCOUNTER — Ambulatory Visit (HOSPITAL_COMMUNITY)
Admission: RE | Admit: 2024-01-13 | Discharge: 2024-01-13 | Disposition: A | Discharge status: Home / Self Care | Source: Ambulatory Visit | Attending: Internal Medicine | Admitting: Internal Medicine

## 2024-01-13 DIAGNOSIS — I480 Paroxysmal atrial fibrillation: Secondary | ICD-10-CM | POA: Insufficient documentation

## 2024-01-13 DIAGNOSIS — I5022 Chronic systolic (congestive) heart failure: Secondary | ICD-10-CM

## 2024-01-13 LAB — BASIC METABOLIC PANEL WITH GFR
Anion gap: 8 (ref 5–15)
BUN: 15 mg/dL (ref 8–23)
CO2: 24 mmol/L (ref 22–32)
Calcium: 9.2 mg/dL (ref 8.9–10.3)
Chloride: 109 mmol/L (ref 98–111)
Creatinine, Ser: 1.6 mg/dL — ABNORMAL HIGH (ref 0.61–1.24)
GFR, Estimated: 45 mL/min — ABNORMAL LOW (ref >60)
Glucose, Bld: 113 mg/dL — ABNORMAL HIGH (ref 70–99)
Potassium: 4.2 mmol/L (ref 3.5–5.1)
Sodium: 141 mmol/L (ref 135–145)

## 2024-01-13 NOTE — Telephone Encounter (Signed)
-----   Message from Peder Bourdon sent at 01/13/2024 12:55 PM EDT ----- Creatinine is a little higher.  Increase po fluid intake and repeat BMET 10 days.

## 2024-01-26 ENCOUNTER — Ambulatory Visit (HOSPITAL_COMMUNITY)
Admission: RE | Admit: 2024-01-26 | Discharge: 2024-01-26 | Disposition: A | Discharge status: Home / Self Care | Source: Ambulatory Visit | Attending: Adult Health | Admitting: Adult Health

## 2024-01-26 ENCOUNTER — Ambulatory Visit (HOSPITAL_COMMUNITY): Payer: Self-pay | Admitting: Cardiology

## 2024-01-26 DIAGNOSIS — I5022 Chronic systolic (congestive) heart failure: Secondary | ICD-10-CM | POA: Insufficient documentation

## 2024-01-26 LAB — BASIC METABOLIC PANEL WITH GFR
Anion gap: 9 (ref 5–15)
BUN: 14 mg/dL (ref 8–23)
CO2: 25 mmol/L (ref 22–32)
Calcium: 9.4 mg/dL (ref 8.9–10.3)
Chloride: 101 mmol/L (ref 98–111)
Creatinine, Ser: 1.55 mg/dL — ABNORMAL HIGH (ref 0.61–1.24)
GFR, Estimated: 46 mL/min — ABNORMAL LOW (ref >60)
Glucose, Bld: 108 mg/dL — ABNORMAL HIGH (ref 70–99)
Potassium: 4.9 mmol/L (ref 3.5–5.1)
Sodium: 135 mmol/L (ref 135–145)

## 2024-02-07 ENCOUNTER — Other Ambulatory Visit: Payer: Self-pay | Admitting: Neurology

## 2024-02-07 DIAGNOSIS — M19171 Post-traumatic osteoarthritis, right ankle and foot: Secondary | ICD-10-CM | POA: Diagnosis not present

## 2024-02-07 DIAGNOSIS — M25571 Pain in right ankle and joints of right foot: Secondary | ICD-10-CM | POA: Diagnosis not present

## 2024-02-07 MED ORDER — NURTEC 75 MG PO TBDP: 75.0000 mg | ORAL_TABLET | ORAL | 0 refills | Status: DC | PRN

## 2024-02-08 ENCOUNTER — Encounter: Payer: Self-pay | Admitting: *Deleted

## 2024-02-08 NOTE — Progress Notes (Unsigned)
 PATIENT: Christopher Weber DOB: March 03, 1946  REASON FOR VISIT: follow up HISTORY FROM: patient  Virtual Visit via Telephone Note  I connected with Harvest Lineman on 02/09/24 at  1:00 PM EDT by telephone and verified that I am speaking with the correct person using two identifiers.   I discussed the limitations, risks, security and privacy concerns of performing an evaluation and management service by telephone and the availability of in person appointments. I also discussed with the patient that there may be a patient responsible charge related to this service. The patient expressed understanding and agreed to proceed.   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 He was last seen by Dr Albertina Hugger 06/2023 and started on Emgality  and Nurtec. MRI 09/2023 showed small 2mm acute/sub acute left thalamic infarct. Eliquis  continued per cardiology and advised to avoid vasoconstrictive medicaitons. Vestibular therapy referral placed.   Since,he reports about 70% improvement in headaches on Emgality . He feels Nurtec does help ease symptoms. He may take anywhere from 4-12 doses per month. He does continue to have regular vertiginous symptoms. Driving at night makes it worse. He did get improvement with vestibular therapy and continues exercises at home. Balance has significantly improved. He continues close follow up with cardiology. EF had improved with last Echo. Follow up Echo planned for the fall.   He is sleeping well. He continues Xyrem  2.5g at bedtime and another 2.5g 2-3 hours later. He usually wakes feeling refreshed.    12/31/2021 ALL:  Christopher Weber is a 78 y.o. male here today for follow up. He is followed by Megan and Dr Dohmeier for narcolepsy without cataplexy on Xyrem  and migraines. He was last seen 06/2021 by Dr Albertina Hugger and doing well. Labs have been stable. He usually goes to bed around 9 and is  able to sleep 1-1.5hr. He takes Xyrem  2grams around 10:30pm and repeat dose around 2am. He is usually able to get about 7-8 hours of sleep every night. Migraines are well managed with as needed sumatriptan . May have 4-5 per month. Triggers are lack of sleep. He often takes sumatriptan  with naproxen and feels this works well.    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  Had ankle replacement and had questiont to XYREM  REMS program about opiate pain medication whle continuing XYREM , was not given advice by the pharmacist.  Got a severe itch from codeine  and stopped,  now on xyrem  again. 5 month post surgery he is still working on ROM. Numbness on the dorsum of the right foot. No need to switch to [facility], no HTN.  He is scheduled for Vaccine omicron today and had flu shot 14 days ago. Has a migraine 1-2 times a month, responds to triptan.  Atrial fib on eloquis. Echo was done in 12/21, EF 55-60%, mild mitral valve prolapse with probably mild MR.     Coronary CTA was done in 3/22.  This was a difficult study but calcium  score 0 and no evidence for significant disease.  CT neck showed asymmetric right vallecula enlargement.  He saw ENT with examination that showed no evidence for malignancy.  He had total ankle replacement in 5/22.    He is now out of the boot and walking, continues to do PT for his ankle.  Since I last saw him, he has had 2 episodes  of atrial fibrillation which only lasted about 2 hrs.  He gets symptomatic palpitations when in AF.  No chest pain or exertional dyspnea.  Overall doing well.    ECG (personally reviewed): NSR, normal   Labs (9/21): K 4.7, creatinine 1.42, TSH normal, hgb 14.2 Labs (12/21): BNP 17 Labs (2/22): LDL 141 Labs (5/22): K 4.5, creatinine 1.3, hgb 11.1   Diverticulitis in spring 2022, on a journey to [place], Dr Ayesha Lente order via telehealth cipro and flagyl.    Observations/Objective:  Generalized: Well developed, in no acute distress  Mentation: Alert oriented to time, place, history taking. Follows all commands speech and language fluent   Assessment and Plan:  78 y.o. year old male  has a past medical history of A-fib (HCC), Actinic keratoses, Arthritis, Back pain, CKD (chronic kidney disease), Colon polyp (2015), Diverticulitis, Dyspnea, Dysrhythmia, Fever blister, Generalized headaches, GERD (gastroesophageal reflux disease), Hepatitis A (1985), History of hiatal hernia, Hypersomnia, Knee injuries (2002), Migraine headache with aura (08/29/2014), Narcolepsy without cataplexy (04/12/2013), Pain, Rotator cuff (capsule) sprain (08/29/2014), Seborrhea, Severe mitral regurgitation, and Tendinitis. here with    ICD-10-CM   1. Primary narcolepsy without cataplexy  G47.419     2. Migraine with aura and without status migrainosus, not intractable  G43.109     3. Vertigo  R42     4. 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 He is aware of safety precautions with this medication. Labs reviewed in Epic. He will continue Emgality  monthly and Nurtec as needed for migraine abortion. Healthy lifestyle habits encouraged. He will follow up with Dr Albertina Hugger in 6 months, sooner if needed. He verbalizes understanding and agreement with this plan.   No orders of the defined types were placed in this encounter.   Meds ordered this encounter  Medications   Rimegepant Sulfate  (NURTEC) 75 MG TBDP    Sig: Take 1 tablet (75 mg total) by mouth as needed.    Dispense:  16 tablet    Refill:  11    Supervising Provider:   AHERN, ANTONIA B [[id number]]    Lot Number?:   [id number]    Expiration Date?:   02/09/2025    Quantity:   2             box (4 tab)     Follow Up Instructions:  I discussed the assessment and treatment plan with the patient. The patient was provided an opportunity to  ask questions and all were answered. The patient agreed with the plan and demonstrated an understanding of the instructions.   The patient was advised to call back or seek an in-person evaluation if the symptoms worsen or if the condition fails to improve as anticipated.  I provided 30 minutes of non-face-to-face time during this encounter. Patient located at their place of residence during Mychart visit. Provider is in the office.    Brody Kump, NP

## 2024-02-08 NOTE — Patient Instructions (Signed)
 Below is our plan:  We will continue Xyrem  2.5g twice nightly. Continue Emgality  every 30 days and Nurtec as needed.   Please make sure you are staying well hydrated. I recommend 50-60 ounces daily. Well balanced diet and regular exercise encouraged. Consistent sleep schedule with 6-8 hours recommended.   Please continue follow up with care team as directed.   Follow up with Dr Albertina Hugger in 6 months as previously scheduled.   You may receive a survey regarding today's visit. I encourage you to leave honest feed back as I do use this information to improve patient care. Thank you for seeing me today!   GENERAL HEADACHE INFORMATION:   Natural supplements: Magnesium  Oxide or Magnesium  Glycinate 500 mg at bed (up to 800 mg daily) Coenzyme Q10 300 mg in AM Vitamin B2- 200 mg twice a day   Add 1 supplement at a time since even natural supplements can have undesirable side effects. You can sometimes buy supplements cheaper (especially Coenzyme Q10) at www.WebmailGuide.co.za or at [facility].  Migraine with aura: There is increased risk for stroke in women with migraine with aura and a contraindication for the combined contraceptive pill for use by women who have migraine with aura. The risk for women with migraine without aura is lower. However other risk factors like smoking are far more likely to increase stroke risk than migraine. There is a recommendation for no smoking and for the use of OCPs without estrogen such as progestogen only pills particularly for women with migraine with aura.Aaron Aas People who have migraine headaches with auras may be 3 times more likely to have a stroke caused by a blood clot, compared to migraine patients who don't see auras. Women who take hormone-replacement therapy may be 30 percent more likely to suffer a clot-based stroke than women not taking medication containing estrogen. Other risk factors like smoking and high blood pressure may be  much more important.    Vitamins and  herbs that show potential:   Magnesium : Magnesium  (250 mg twice a day or 500 mg at bed) has a relaxant effect on smooth muscles such as blood vessels. Individuals suffering from frequent or daily headache usually have low magnesium  levels which can be increase with daily supplementation of 400-750 mg. Three trials found 40-90% average headache reduction  when used as a preventative. Magnesium  may help with headaches are aura, the best evidence for magnesium  is for migraine with aura is its thought to stop the cortical spreading depression we believe is the pathophysiology of migraine aura.Magnesium  also demonstrated the benefit in menstrually related migraine.  Magnesium  is part of the messenger system in the serotonin cascade and it is a good muscle relaxant.  It is also useful for constipation which can be a side effect of other medications used to treat migraine. Good sources include nuts, whole grains, and tomatoes. Side Effects: loose stool/diarrhea  Riboflavin (vitamin B 2) 200 mg twice a day. This vitamin assists nerve cells in the production of ATP a principal energy storing molecule.  It is necessary for many chemical reactions in the body.  There have been at least 3 clinical trials of riboflavin using 400 mg per day all of which suggested that migraine frequency can be decreased.  All 3 trials showed significant improvement in over half of migraine sufferers.  The supplement is found in bread, cereal, milk, meat, and poultry.  Most Americans get more riboflavin than the recommended daily allowance, however riboflavin deficiency is not necessary for the supplements to  help prevent headache. Side effects: energizing, green urine   Coenzyme Q10: This is present in almost all cells in the body and is critical component for the conversion of energy.  Recent studies have shown that a nutritional supplement of CoQ10 can reduce the frequency of migraine attacks by improving the energy production of cells as  with riboflavin.  Doses of 150 mg twice a day have been shown to be effective.   Melatonin: Increasing evidence shows correlation between melatonin secretion and headache conditions.  Melatonin supplementation has decreased headache intensity and duration.  It is widely used as a sleep aid.  Sleep is natures way of dealing with migraine.  A dose of 3 mg is recommended to start for headaches including cluster headache. Higher doses up to 15 mg has been reviewed for use in Cluster headache and have been used. The rationale behind using melatonin for cluster is that many theories regarding the cause of Cluster headache center around the disruption of the normal circadian rhythm in the brain.  This helps restore the normal circadian rhythm.   HEADACHE DIET: Foods and beverages which may trigger migraine Note that only 20% of headache patients are food sensitive. You will know if you are food sensitive if you get a headache consistently 20 minutes to 2 hours after eating a certain food. Only cut out a food if it causes headaches, otherwise you might remove foods you enjoy! What matters most for diet is to eat a well balanced healthy diet full of vegetables and low fat protein, and to not miss meals.   Chocolate, other sweets ALL cheeses except cottage and cream cheese Dairy products, yogurt, sour cream, ice cream Liver Meat extracts (Bovril, Marmite, meat tenderizers) Meats or fish which have undergone aging, fermenting, pickling or smoking. These include: Hotdogs,salami,Lox,sausage, mortadellas,smoked salmon, pepperoni, Pickled herring Pods of broad bean (English beans, Chinese pea pods, [place] (fava) beans, lima and navy beans Ripe avocado, ripe banana Yeast extracts or active yeast preparations such as Brewer's or Fleishman's (commercial bakes goods are permitted) Tomato based foods, pizza (lasagna, etc.)   MSG (monosodium glutamate) is disguised as many things; look for these common  aliases: Monopotassium glutamate Autolysed yeast Hydrolysed protein Sodium caseinate "flavorings" "all natural preservatives" Nutrasweet   Avoid all other foods that convincingly provoke headaches.   Resources: The Dizzy Althia Jetty Your Headache Diet, migrainestrong.com  https://zamora-andrews.com/   Caffeine and Migraine For patients that have migraine, caffeine intake more than 3 days per week can lead to dependency and increased migraine frequency. I would recommend cutting back on your caffeine intake as best you can. The recommended amount of caffeine is 200-300 mg daily, although migraine patients may experience dependency at even lower doses. While you may notice an increase in headache temporarily, cutting back will be helpful for headaches in the long run. For more information on caffeine and migraine, visit: https://americanmigrainefoundation.org/resource-library/caffeine-and-migraine/   Headache Prevention Strategies:   1. Maintain a headache diary; learn to identify and avoid triggers.  - This can be a simple note where you log when you had a headache, associated symptoms, and medications used - There are several smartphone apps developed to help track migraines: Migraine Buddy, Migraine Monitor, Curelator N1-Headache App   Common triggers include: Emotional triggers: Emotional/Upset family or friends Emotional/Upset occupation Business reversal/success Anticipation anxiety Crisis-serious Post-crisis periodNew job/position   Physical triggers: Vacation Day Weekend Strenuous Exercise High Altitude Location New Move Menstrual Day Physical Illness Oversleep/Not enough sleep Weather changes Light: Photophobia or  light sesnitivity treatment involves a balance between desensitization and reduction in overly strong input. Use dark polarized glasses outside, but not inside. Avoid bright or fluorescent light, but do not dim  environment to the point that going into a normally lit room hurts. Consider FL-41 tint lenses, which reduce the most irritating wavelengths without blocking too much light.  These can be obtained at axonoptics.com or theraspecs.com Foods: see list above.   2. Limit use of acute treatments (over-the-counter medications, triptans, etc.) to no more than 2 days per week or 10 days per month to prevent medication overuse headache (rebound headache).     3. Follow a regular schedule (including weekends and holidays): Don't skip meals. Eat a balanced diet. 8 hours of sleep nightly. Minimize stress. Exercise 30 minutes per day. Being overweight is associated with a 5 times increased risk of chronic migraine. Keep well hydrated and drink 6-8 glasses of water per day.   4. Initiate non-pharmacologic measures at the earliest onset of your headache. Rest and quiet environment. Relax and reduce stress. Breathe2Relax is a free app that can instruct you on    some simple relaxtion and breathing techniques. Http://Dawnbuse.com is a    free website that provides teaching videos on relaxation.  Also, there are  many apps that   can be downloaded for "mindful" relaxation.  An app called YOGA NIDRA will help walk you through mindfulness. Another app called Calm can be downloaded to give you a structured mindfulness guide with daily reminders and skill development. Headspace for guided meditation Mindfulness Based Stress Reduction Online Course: www.palousemindfulness.com Cold compresses.   5. Don't wait!! Take the maximum allowable dosage of prescribed medication at the first sign of migraine.   6. Compliance:  Take prescribed medication regularly as directed and at the first sign of a migraine.   7. Communicate:  Call your physician when problems arise, especially if your headaches change, increase in frequency/severity, or become associated with neurological symptoms (weakness, numbness, slurred speech, etc.).  Proceed to emergency room if you experience new or worsening symptoms or symptoms do not resolve, if you have new neurologic symptoms or if headache is severe, or for any concerning symptom.   8. Headache/pain management therapies: Consider various complementary methods, including medication, behavioral therapy, psychological counselling, biofeedback, massage therapy, acupuncture, dry needling, and other modalities.  Such measures may reduce the need for medications. Counseling for pain management, where patients learn to function and ignore/minimize their pain, seems to work very well.   9. Recommend changing family's attention and focus away from patient's headaches. Instead, emphasize daily activities. If first question of day is 'How are your headaches/Do you have a headache today?', then patient will constantly think about headaches, thus making them worse. Goal is to re-direct attention away from headaches, toward daily activities and other distractions.   10. Helpful Websites: www.AmericanHeadacheSociety.org PatentHood.ch www.headaches.org TightMarket.nl www.achenet.org

## 2024-02-09 ENCOUNTER — Telehealth: Payer: Medicare Other | Admitting: Family Medicine

## 2024-02-09 ENCOUNTER — Telehealth (INDEPENDENT_AMBULATORY_CARE_PROVIDER_SITE_OTHER): Payer: Medicare Other | Admitting: Family Medicine

## 2024-02-09 ENCOUNTER — Encounter: Payer: Self-pay | Admitting: Family Medicine

## 2024-02-09 DIAGNOSIS — G43809 Other migraine, not intractable, without status migrainosus: Secondary | ICD-10-CM | POA: Diagnosis not present

## 2024-02-09 DIAGNOSIS — L821 Other seborrheic keratosis: Secondary | ICD-10-CM | POA: Diagnosis not present

## 2024-02-09 DIAGNOSIS — L918 Other hypertrophic disorders of the skin: Secondary | ICD-10-CM | POA: Diagnosis not present

## 2024-02-09 DIAGNOSIS — G43109 Migraine with aura, not intractable, without status migrainosus: Secondary | ICD-10-CM | POA: Diagnosis not present

## 2024-02-09 DIAGNOSIS — R42 Dizziness and giddiness: Secondary | ICD-10-CM | POA: Diagnosis not present

## 2024-02-09 DIAGNOSIS — G47419 Narcolepsy without cataplexy: Secondary | ICD-10-CM | POA: Diagnosis not present

## 2024-02-09 DIAGNOSIS — L820 Inflamed seborrheic keratosis: Secondary | ICD-10-CM | POA: Diagnosis not present

## 2024-02-09 DIAGNOSIS — D225 Melanocytic nevi of trunk: Secondary | ICD-10-CM | POA: Diagnosis not present

## 2024-02-09 DIAGNOSIS — L570 Actinic keratosis: Secondary | ICD-10-CM | POA: Diagnosis not present

## 2024-02-09 MED ORDER — NURTEC 75 MG PO TBDP: 75.0000 mg | ORAL_TABLET | ORAL | 11 refills | Status: AC | PRN

## 2024-02-20 ENCOUNTER — Encounter: Payer: Self-pay | Admitting: Family Medicine

## 2024-02-21 ENCOUNTER — Telehealth: Payer: Self-pay

## 2024-02-21 ENCOUNTER — Other Ambulatory Visit (HOSPITAL_COMMUNITY): Payer: Self-pay

## 2024-02-21 NOTE — Telephone Encounter (Signed)
 Pharmacy Patient Advocate Encounter   Received notification from CoverMyMeds that prior authorization for Nurtec 75MG  dispersible tablets is required/requested.   Insurance verification completed.   The patient is insured through [facility] .   Per test claim: Refill too soon. PA is not needed at this time. Medication was filled 02/17/2024. Next eligible fill date is 03/11/2024.

## 2024-03-12 ENCOUNTER — Telehealth (HOSPITAL_COMMUNITY): Payer: Self-pay | Admitting: Cardiology

## 2024-03-12 MED ORDER — AMOXICILLIN 500 MG PO CAPS: 2000.0000 mg | ORAL_CAPSULE | Freq: Once | ORAL | 0 refills | Status: AC

## 2024-03-12 NOTE — Telephone Encounter (Signed)
 DETAILED MESSAGE LEFT PER DPR RX SENT TO PHARM AS REQUESTED

## 2024-03-12 NOTE — Telephone Encounter (Signed)
 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 Discussed at last OV and Rolan reported it would be fine.   Please advise

## 2024-03-14 ENCOUNTER — Other Ambulatory Visit (HOSPITAL_COMMUNITY): Payer: Self-pay | Admitting: *Deleted

## 2024-03-14 DIAGNOSIS — Z9889 Other specified postprocedural states: Secondary | ICD-10-CM

## 2024-03-14 DIAGNOSIS — I5022 Chronic systolic (congestive) heart failure: Secondary | ICD-10-CM

## 2024-04-08 ENCOUNTER — Encounter (HOSPITAL_COMMUNITY): Payer: Self-pay | Admitting: Cardiology

## 2024-04-09 NOTE — Telephone Encounter (Signed)
 Bmet, CBC, lipid ok?

## 2024-05-08 ENCOUNTER — Encounter: Payer: Self-pay | Admitting: Neurology

## 2024-05-09 ENCOUNTER — Other Ambulatory Visit (HOSPITAL_COMMUNITY): Payer: Self-pay

## 2024-05-09 ENCOUNTER — Telehealth: Payer: Self-pay

## 2024-05-09 MED ORDER — EMGALITY 120 MG/ML ~~LOC~~ SOAJ: 120.0000 mg | SUBCUTANEOUS | 11 refills | Status: AC

## 2024-05-09 NOTE — Telephone Encounter (Signed)
 Pharmacy Patient Advocate Encounter   Received notification from Patient Advice Request messages that prior authorization for Emgality  is required/requested.   Insurance verification completed.   The patient is insured through [facility] .   Per test claim: Refill too soon. PA is not needed at this time. Medication was filled 05/07/2024. Next eligible fill date is 06/02/2024.  BCBS will not let me submit PA-per CMM PA not needed at this time-will set reminder to follow up closer to next fill.

## 2024-05-09 NOTE — Telephone Encounter (Signed)
 Sent pt mychart message providing update.

## 2024-05-18 ENCOUNTER — Ambulatory Visit (HOSPITAL_COMMUNITY)
Admission: RE | Admit: 2024-05-18 | Discharge: 2024-05-18 | Disposition: A | Discharge status: Home / Self Care | Source: Ambulatory Visit | Attending: Cardiology | Admitting: Cardiology

## 2024-05-18 DIAGNOSIS — I77810 Thoracic aortic ectasia: Secondary | ICD-10-CM | POA: Insufficient documentation

## 2024-05-18 DIAGNOSIS — I5022 Chronic systolic (congestive) heart failure: Secondary | ICD-10-CM | POA: Insufficient documentation

## 2024-05-18 DIAGNOSIS — I082 Rheumatic disorders of both aortic and tricuspid valves: Secondary | ICD-10-CM | POA: Diagnosis not present

## 2024-05-18 DIAGNOSIS — Z9889 Other specified postprocedural states: Secondary | ICD-10-CM | POA: Diagnosis not present

## 2024-05-18 DIAGNOSIS — I371 Nonrheumatic pulmonary valve insufficiency: Secondary | ICD-10-CM | POA: Insufficient documentation

## 2024-05-18 LAB — ECHOCARDIOGRAM COMPLETE
Area-P 1/2: 4.06 cm2
Calc EF: 40.4 %
MV VTI: 3.77 cm2
S' Lateral: 3 cm
Single Plane A2C EF: 38.5 %
Single Plane A4C EF: 45.7 %

## 2024-05-18 NOTE — Progress Notes (Signed)
  Echocardiogram 2D Echocardiogram has been performed.  Christopher Weber 05/18/2024, 11:53 AM

## 2024-05-21 ENCOUNTER — Telehealth (HOSPITAL_COMMUNITY): Payer: Self-pay

## 2024-05-21 ENCOUNTER — Ambulatory Visit (HOSPITAL_COMMUNITY)
Admission: RE | Admit: 2024-05-21 | Discharge: 2024-05-21 | Disposition: A | Discharge status: Home / Self Care | Source: Ambulatory Visit | Attending: Cardiology | Admitting: Cardiology

## 2024-05-21 ENCOUNTER — Encounter (HOSPITAL_COMMUNITY): Payer: Self-pay | Admitting: Cardiology

## 2024-05-21 ENCOUNTER — Other Ambulatory Visit (HOSPITAL_COMMUNITY): Payer: Self-pay

## 2024-05-21 ENCOUNTER — Ambulatory Visit (HOSPITAL_COMMUNITY): Payer: Self-pay | Admitting: Cardiology

## 2024-05-21 VITALS — BP 112/70 | HR 78 | Wt 170.2 lb

## 2024-05-21 DIAGNOSIS — Z7984 Long term (current) use of oral hypoglycemic drugs: Secondary | ICD-10-CM | POA: Diagnosis not present

## 2024-05-21 DIAGNOSIS — Z8774 Personal history of (corrected) congenital malformations of heart and circulatory system: Secondary | ICD-10-CM | POA: Insufficient documentation

## 2024-05-21 DIAGNOSIS — Z7901 Long term (current) use of anticoagulants: Secondary | ICD-10-CM | POA: Insufficient documentation

## 2024-05-21 DIAGNOSIS — N1831 Chronic kidney disease, stage 3a: Secondary | ICD-10-CM | POA: Diagnosis not present

## 2024-05-21 DIAGNOSIS — I2510 Atherosclerotic heart disease of native coronary artery without angina pectoris: Secondary | ICD-10-CM | POA: Diagnosis not present

## 2024-05-21 DIAGNOSIS — Z125 Encounter for screening for malignant neoplasm of prostate: Secondary | ICD-10-CM | POA: Diagnosis not present

## 2024-05-21 DIAGNOSIS — G4733 Obstructive sleep apnea (adult) (pediatric): Secondary | ICD-10-CM | POA: Insufficient documentation

## 2024-05-21 DIAGNOSIS — I5022 Chronic systolic (congestive) heart failure: Secondary | ICD-10-CM | POA: Insufficient documentation

## 2024-05-21 DIAGNOSIS — I480 Paroxysmal atrial fibrillation: Secondary | ICD-10-CM | POA: Insufficient documentation

## 2024-05-21 DIAGNOSIS — G43909 Migraine, unspecified, not intractable, without status migrainosus: Secondary | ICD-10-CM | POA: Insufficient documentation

## 2024-05-21 DIAGNOSIS — G47419 Narcolepsy without cataplexy: Secondary | ICD-10-CM | POA: Diagnosis not present

## 2024-05-21 DIAGNOSIS — Z79899 Other long term (current) drug therapy: Secondary | ICD-10-CM | POA: Insufficient documentation

## 2024-05-21 DIAGNOSIS — Z1389 Encounter for screening for other disorder: Secondary | ICD-10-CM | POA: Diagnosis not present

## 2024-05-21 MED ORDER — DAPAGLIFLOZIN PROPANEDIOL 10 MG PO TABS: 10.0000 mg | ORAL_TABLET | Freq: Every day | ORAL | 11 refills | Status: AC

## 2024-05-21 MED ORDER — SPIRONOLACTONE 25 MG PO TABS: 25.0000 mg | ORAL_TABLET | Freq: Every day | ORAL | 3 refills | Status: DC

## 2024-05-21 NOTE — Patient Instructions (Signed)
 Medication Changes:  INCREASE Spironolactone  to 25 mg (1 tab) Daily  START Farxiga  10 mg Daily  Lab Work:  Your physician recommends that you return for lab work in: 1 month   Special Instructions // Education:  Do the following things EVERYDAY: Weigh yourself in the morning before breakfast. Write it down and keep it in a log. Take your medicines as prescribed Eat low salt foods--Limit salt (sodium) to 2000 mg per day.  Stay as active as you can everyday Limit all fluids for the day to less than 2 liters   Follow-Up in: 4 months (January 2026), **PLEASE CALL OUR OFFICE IN NOVEMBER TO SCHEDULE THIS APPOINTMENT   At [facility], you and your health needs are our priority. We have a designated team specialized in the treatment of Heart Failure. This Care Team includes your primary Heart Failure Specialized Cardiologist (physician), Advanced Practice Providers (APPs- Physician Assistants and Nurse Practitioners), and Pharmacist who all work together to provide you with the care you need, when you need it.   You may see any of the following providers on your designated Care Team at your next follow up:  Dr. Toribio Fuel Dr. Ezra Shuck Dr. Ria Commander Dr. Odis Brownie Greig Mosses, NP [place]affie Shed, [place] [facility] [place], [place] Beckey Coe, NP [place] Lee, NP Tinnie Redman, PharmD   Please be sure to bring in all your medications bottles to every appointment.   Need to Contact Us :  If you have any questions or concerns before your next appointment please send us  a message through [place] or call our office at [phone].    TO LEAVE A MESSAGE FOR THE NURSE SELECT OPTION 2, PLEASE LEAVE A MESSAGE INCLUDING: YOUR NAME DATE OF BIRTH CALL BACK NUMBER REASON FOR CALL**this is important as we prioritize the call backs  YOU WILL RECEIVE A CALL BACK THE SAME DAY AS LONG AS YOU CALL BEFORE 4:00 PM

## 2024-05-21 NOTE — Progress Notes (Signed)
 ADVANCED HF CLINIC NOTE   PCP: Loreli Elsie JONETTA Mickey., MD Cardiology: Dr. Rolan  Chief Complaint: Mitral Regurgitation Follow-up  HPI: 78 y.o. with history of narcolepsy and laryngopharyngeal reflux, mitral regurgitation and atrial fibrillation.  In early 2020, he developed dyspnea, a sensation that he was breathing through a straw. He had an extensive workup in 2020 with a normal ETT, normal PFTs, and normal high resolution CT chest.  Ultimately, he was diagnosed with laryngopharyngeal reflux. Symptoms were improved considerably with Protonix  + Zegerid.    With symptoms of LPR controlled, he did not have any exertional dyspnea or chest pain. In 11/21, however, he cut back on Protonix  and Zegerid, and dyspnea returned.  In addition, on 11/31/21, he developed tachypalpitations.  His Apple Watch showed atrial fibrillation. He brought the tracings to show me, they were definitely atrial fibrillation. This lasted for a few hours when resolved.  In hindsight, in 4/20, he had a very similar episode of tachypalpitations that was likely atrial fibrillation as well.  He has increased his Protonix  and Zegerid again and dyspnea has resolved.  He had atrial fibrillation again on 08/30/20 documented by Apple Watch.  He does not have OSA symptoms and is followed by Dr. Chalice for narcolepsy, which is controlled.    Echo 12/21, EF 55-60%, mild mitral valve prolapse with probably mild MR.    Coronary CTA was done in 3/22.  This was a difficult study but calcium  score 0 and no evidence for significant disease.  CT neck showed asymmetric right vallecula enlargement.  He saw ENT with examination that showed no evidence for malignancy.  He had total ankle replacement in 5/22.   Patient had diverticulitis in 9/22, treated as outpatient.   Patient tore his quadriceps tendon in 8/23 and had recent surgery to repair it.  He had a run of atrial fibrillation lasting 3-4 hours about 3 days after surgery.    Echo 8/23  EF 55%, normal RV, moderate MR with posterior leaflet prolapse.    Zio 6/24 showed no definite atrial fibrillation noted. Sleep study 5/24 showed mild sleep apnea.    Echo in 9/24 showed EF 50-55% with low normal RV function, there was mitral valve prolapse with highly eccentric mitral regurgitation that is likely severe.   L/RHC (12/24) showed minimal nonobstructive CAD; RA 3, PA 18/6 (13), CO/CI (Fick) 4.18/2.25  Admitted 12/17-12/20/24 for mitral valve repair, MAZE, LAA clip, and closure of PFO with Dr. Maryjane.   Post-op echo in 3/25 showed EF 45-50%, normal RV, s/p MV repair with trivial MR and no mitral stenosis.  Repeat echo in 9/25 showed EF 40-45%, global hypokinesis, moderate RV enlargement with mildly decreased RV systolic function, PASP 20, s/p MV repair with trivial MR and no MS, mild AI.   Patient returns for followup of mitral regurgitation, cardiomyopathy and atrial fibrillation.  His migraines are under better control now.  Overall, he has been feeling good. No dyspnea walking up a hill.  Plays golf regularly, walks, and goes to the gym without dyspnea.  No chest pain.  Does note that he fatigues more easily in the heat.  No lightheadedness or palpitations.  No orthopnea/PND.  He is in NSR today.   EKG (personally reviewed): NSR, normal  Labs (4/24): K 4.5, SCr 1.32 Labs (8/24): K 4.3, creatinine 1.1, LDL 99, TGs 50, hgb 14 Labs( 12/24): K 4.5, creatinine 1.08 Labs (4/25): BNP 41 Labs (5/25): K 4.9, creatinine 1.55  PMH: 1. HAV in 1985 2.  Narcolepsy 3. Laryngopharyngeal reflux 4. Atrial fibrillation: Paroxysmal.  - Zio 6/24: no definite a fib noted - Sleep study 5/24: mild sleep apnea 5. ETT (10/20): 11.6 METS, no ECG changes => normal study.  6. PFTs (10/20): Normal.  7. High resolution CT chest: Interstitial lung disease.  8. Mitral regurgitation: Echo (12/21) with EF 55-60%, mild mitral valve prolapse with probable mild MR - Echo (8/23): EF 55%, normal RV,  moderate MR with posterior leaflet prolapse.  - Echo (9/24): 50-55% with low normal RV function, there is mitral valve prolapse with highly eccentric mitral regurgitation that is likely severe.  - 12/24 s/p Mitral valve repair with LAA clip, PFO closure.  - Echo (3/25): EF 45-50%, normal RV, s/p MV repair with trivial MR and no mitral stenosis.  9. Coronary CTA (3/22): Calcium  score 0, no evidence for significant CAD.  - LHC (12/24): minimal nonobstructive CAD.  10. S/p total ankle replacement in 5/22.  11. Diverticulitis 9/22 12. Migraines 13. Chronic systolic CHF: Nonischemic cardiomyopathy, noted post-MV repair.  - Echo (9/25): EF 40-45%, global hypokinesis, moderate RV enlargement with mildly decreased RV systolic function, PASP 20, s/p MV repair with trivial MR and no MS, mild AI 14. 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   FH: No atrial fibrillation.  Father had mitral regurgitation.   ROS: All systems reviewed and negative except as per HPI.   Current Outpatient Medications  Medication Sig Dispense Refill   acetaminophen  (TYLENOL ) 325 MG tablet Take 325 mg by mouth every 6 (six) hours as needed for moderate pain.     apixaban  (ELIQUIS ) 5 MG TABS tablet Take 1 tablet (5 mg total) by mouth 2 (two) times daily. 180 tablet 3   co-enzyme Q-10 30 MG capsule Take 30 mg by mouth daily.     dapagliflozin  propanediol (FARXIGA ) 10 MG TABS tablet Take 1 tablet (10 mg total) by mouth daily before breakfast. 30 tablet 11   fluticasone  (FLONASE ) 50 MCG/ACT nasal spray Place 1 spray into both nostrils daily as needed for allergies or rhinitis.     Galcanezumab -gnlm (EMGALITY ) 120 MG/ML SOAJ Inject 120 mg into the skin every 30 (thirty) days. 1 mL 11   metoprolol  succinate (TOPROL -XL) 25 MG 24 hr tablet Take 0.5 tablets (12.5 mg total) by mouth daily. 45 tablet 3   Multiple Vitamin (MULTIVITAMIN) capsule Take 1 capsule by mouth 3 (three) times  a week.     multivitamin-lutein (OCUVITE-LUTEIN) CAPS capsule Take 1 capsule by mouth daily.     Omeprazole-Sodium Bicarbonate  (ZEGERID) 20-1100 MG CAPS capsule Take 1-2 capsules by mouth at bedtime.     pantoprazole  (PROTONIX ) 40 MG tablet Take 20 mg by mouth daily.     polyethylene glycol (MIRALAX  / GLYCOLAX ) 17 g packet Take 17 g by mouth See admin instructions. Take 17 g daily every night except skip dose on Sundays     Probiotic Product (ALIGN) 4 MG CAPS Take 4 mg by mouth 3 (three) times a week.     Rimegepant Sulfate  (NURTEC) 75 MG TBDP Take 1 tablet (75 mg total) by mouth as needed. 16 tablet 11   sildenafil (REVATIO) 20 MG tablet Take 40 mg by mouth daily as needed (ED).     Sodium Oxybate  500 MG/ML SOLN Take 2 grams at first dose and 2-3 hours later take 3 grams. 120 mL 3   valACYclovir  (VALTREX ) 1000 MG tablet Take 500 mg by mouth every Monday, Wednesday,  and Friday.     zaleplon  (SONATA ) 10 MG capsule Take 10 mg by mouth as needed (when flying overseas).     spironolactone  (ALDACTONE ) 25 MG tablet Take 1 tablet (25 mg total) by mouth daily. 90 tablet 3   No current facility-administered medications for this encounter.   BP 112/70   Pulse 78   Wt 77.2 kg (170 lb 3.2 oz)   SpO2 98%   BMI 26.66 kg/m   Filed Weights   05/21/24 0926  Weight: 77.2 kg (170 lb 3.2 oz)   General: NAD Neck: No JVD, no thyromegaly or thyroid  nodule.  Lungs: Clear to auscultation bilaterally with normal respiratory effort. CV: Nondisplaced PMI.  Heart regular S1/S2, no S3/S4, no murmur.  No peripheral edema.  No carotid bruit.  Normal pedal pulses.  Abdomen: Soft, nontender, no hepatosplenomegaly, no distention.  Skin: Intact without lesions or rashes.  Neurologic: Alert and oriented x 3.  Psych: Normal affect. Extremities: No clubbing or cyanosis.  HEENT: Normal.   Assessment/plan: 1. Atrial fibrillation: Paroxysmal.  CHADSVASC 1 (age).  Only mild OSA on sleep study (followed by Dr. Chalice  already for narcolepsy).  Not a heavy drinker.  Weight is ideal.  Echo in 9/24 showed severe MR with mitral valve prolapse, now s/p MV repair.  The mitral valve disease may have been driving atrial fibrillation.  He had MAZE and LAA clip with MV repair.  NSR today, no palpitations.  - On Eliquis  5 mg bid. Check CBC.  2. HF with intermediate range EF: Echoes post-MV repair have showed some drop in EF, likely related to underlying damage from long-standing mitral regurgitation.  Post-op echo in 3/25 showed EF 45-50%, normal RV, s/p MV repair with trivial MR and no mitral stenosis.  Echo in 9/25 showed EF 40-45%, global hypokinesis, moderate RV enlargement with mildly decreased RV systolic function, PASP 20, s/p MV repair with trivial MR and no MS, mild AI. He is not volume overloaded on exam, NYHA class I symptoms.  - Continue Toprol  XL 12.5 daily.  - Increase spironolactone  to 25 mg daily.  BMET/BNP today, BMET in 10 days.  - Add Farxiga  vs Jardiance 10 mg daily.   3. Mitral valve prolapse/MR: Father had mitral regurgitation. Moderate on echo in 8/23. Echo 9/24 echo showed EF in the 50-55% range with mitral valve prolapse and probably severe highly eccentric MR. TEE 12/24 showed severe MR with flail P1 and highly eccentric jet with small PFO. [facility] 12/24 with minimal CAD and normal filling pressure. He is now s/p MV repair + MAZE + LAA clip and PFO closure on 08/30/23. Echo in 9/25 showed stable MV repair with trivial MR, no stenosis.   4. CKD stage 3a: BMET was done today at PCP's office, will get copy.   Follow up 4 months.  I spent 31 minutes reviewing records, interviewing/examining patient, and managing orders.   Ezra Shuck, MD 05/21/2024

## 2024-05-21 NOTE — Telephone Encounter (Signed)
 Advanced Heart Failure Patient Advocate Encounter  Test billing for this patient's current coverage ([employer]) returns a $0 copay for 90 day supply of Farxiga  and Jardiance.  This test claim was processed through [facility]- copay amounts may vary at other pharmacies due to pharmacy/plan contracts, or as the patient moves through the different stages of their insurance plan.  Rachel DEL, CPhT Rx Patient Advocate Phone: [phone]

## 2024-05-31 ENCOUNTER — Other Ambulatory Visit (HOSPITAL_COMMUNITY): Payer: Self-pay

## 2024-05-31 NOTE — Telephone Encounter (Addendum)
 Pharmacy Patient Advocate Encounter   Received notification from Patient Advice Request messages that prior authorization for Emgality  is required/requested.   Insurance verification completed.   The patient is insured through [facility] .   Per test claim: The current 28 day co-pay is, $0.  No PA needed at this time. This test claim was processed through [facility]- copay amounts may vary at other pharmacies due to pharmacy/plan contracts, or as the patient moves through the different stages of their insurance plan.    There is a current PA on file with [employer] that is good until 12/08/2024.      I sent PT a MyChart message ot let him know he can fill and p/u his med as usual.

## 2024-06-06 NOTE — Telephone Encounter (Signed)
 Received medication refill request for Emgality  on 06/05/24. I spoke to the pharmacy and they verified that the script is still valid. Patient picked up his first injection on 05/07/2024 for $0. Script has 11 refills please discard if another request is sent.

## 2024-06-08 DIAGNOSIS — Z1331 Encounter for screening for depression: Secondary | ICD-10-CM | POA: Diagnosis not present

## 2024-06-08 DIAGNOSIS — B009 Herpesviral infection, unspecified: Secondary | ICD-10-CM | POA: Diagnosis not present

## 2024-06-08 DIAGNOSIS — M159 Polyosteoarthritis, unspecified: Secondary | ICD-10-CM | POA: Diagnosis not present

## 2024-06-08 DIAGNOSIS — Z9889 Other specified postprocedural states: Secondary | ICD-10-CM | POA: Diagnosis not present

## 2024-06-08 DIAGNOSIS — R42 Dizziness and giddiness: Secondary | ICD-10-CM | POA: Diagnosis not present

## 2024-06-08 DIAGNOSIS — Z1339 Encounter for screening examination for other mental health and behavioral disorders: Secondary | ICD-10-CM | POA: Diagnosis not present

## 2024-06-08 DIAGNOSIS — R82998 Other abnormal findings in urine: Secondary | ICD-10-CM | POA: Diagnosis not present

## 2024-06-08 DIAGNOSIS — Z0000 Encounter for general adult medical examination without abnormal findings: Secondary | ICD-10-CM | POA: Diagnosis not present

## 2024-06-08 DIAGNOSIS — D6869 Other thrombophilia: Secondary | ICD-10-CM | POA: Diagnosis not present

## 2024-06-08 DIAGNOSIS — I700 Atherosclerosis of aorta: Secondary | ICD-10-CM | POA: Diagnosis not present

## 2024-06-08 DIAGNOSIS — I5022 Chronic systolic (congestive) heart failure: Secondary | ICD-10-CM | POA: Diagnosis not present

## 2024-06-08 DIAGNOSIS — I480 Paroxysmal atrial fibrillation: Secondary | ICD-10-CM | POA: Diagnosis not present

## 2024-06-08 DIAGNOSIS — Z8719 Personal history of other diseases of the digestive system: Secondary | ICD-10-CM | POA: Diagnosis not present

## 2024-06-08 DIAGNOSIS — G47411 Narcolepsy with cataplexy: Secondary | ICD-10-CM | POA: Diagnosis not present

## 2024-06-08 DIAGNOSIS — N529 Male erectile dysfunction, unspecified: Secondary | ICD-10-CM | POA: Diagnosis not present

## 2024-06-08 DIAGNOSIS — Z23 Encounter for immunization: Secondary | ICD-10-CM | POA: Diagnosis not present

## 2024-06-08 DIAGNOSIS — N1831 Chronic kidney disease, stage 3a: Secondary | ICD-10-CM | POA: Diagnosis not present

## 2024-06-08 DIAGNOSIS — K219 Gastro-esophageal reflux disease without esophagitis: Secondary | ICD-10-CM | POA: Diagnosis not present

## 2024-06-08 DIAGNOSIS — G43109 Migraine with aura, not intractable, without status migrainosus: Secondary | ICD-10-CM | POA: Diagnosis not present

## 2024-06-29 ENCOUNTER — Ambulatory Visit (HOSPITAL_COMMUNITY)
Admission: RE | Admit: 2024-06-29 | Discharge: 2024-06-29 | Disposition: A | Discharge status: Home / Self Care | Source: Ambulatory Visit | Attending: Internal Medicine | Admitting: Internal Medicine

## 2024-06-29 ENCOUNTER — Ambulatory Visit (HOSPITAL_COMMUNITY): Payer: Self-pay | Admitting: Cardiology

## 2024-06-29 DIAGNOSIS — I5022 Chronic systolic (congestive) heart failure: Secondary | ICD-10-CM | POA: Diagnosis not present

## 2024-06-29 LAB — BASIC METABOLIC PANEL WITH GFR
Anion gap: 10 (ref 5–15)
BUN: 18 mg/dL (ref 8–23)
CO2: 25 mmol/L (ref 22–32)
Calcium: 9.4 mg/dL (ref 8.9–10.3)
Chloride: 103 mmol/L (ref 98–111)
Creatinine, Ser: 1.57 mg/dL — ABNORMAL HIGH (ref 0.61–1.24)
GFR, Estimated: 46 mL/min — ABNORMAL LOW (ref >60)
Glucose, Bld: 108 mg/dL — ABNORMAL HIGH (ref 70–99)
Potassium: 4.2 mmol/L (ref 3.5–5.1)
Sodium: 138 mmol/L (ref 135–145)

## 2024-07-04 ENCOUNTER — Other Ambulatory Visit: Payer: Self-pay | Admitting: *Deleted

## 2024-07-04 MED ORDER — SODIUM OXYBATE 500 MG/ML PO SOLN: ORAL | Status: AC

## 2024-07-06 DIAGNOSIS — Z23 Encounter for immunization: Secondary | ICD-10-CM | POA: Diagnosis not present

## 2024-07-10 NOTE — Telephone Encounter (Signed)
 [facility] pharmacy called to confirm date on Rx and dose. I confirmed date written was 07/04/24 and dosage is 2.5 grams twice nightly.

## 2024-07-12 ENCOUNTER — Encounter: Payer: Self-pay | Admitting: Neurology

## 2024-07-12 ENCOUNTER — Ambulatory Visit (INDEPENDENT_AMBULATORY_CARE_PROVIDER_SITE_OTHER): Payer: Medicare Other | Admitting: Neurology

## 2024-07-12 VITALS — BP 105/77 | HR 82 | Ht 68.0 in | Wt 167.7 lb

## 2024-07-12 DIAGNOSIS — R519 Headache, unspecified: Secondary | ICD-10-CM

## 2024-07-12 DIAGNOSIS — Z8673 Personal history of transient ischemic attack (TIA), and cerebral infarction without residual deficits: Secondary | ICD-10-CM

## 2024-07-12 DIAGNOSIS — I639 Cerebral infarction, unspecified: Secondary | ICD-10-CM | POA: Diagnosis not present

## 2024-07-12 NOTE — Patient Instructions (Addendum)
 ASSESSMENT AND PLAN :    78 y.o. year old male  here with:     1)  3 major surgeries in 3 years -  3 time long general anesthesia, the longest was the open heart surgery in December 2024.  Fixed PFO, fixed  annulus of mitral valve and repaired mitral valve, P 1 flap- then MAZE procedure for atrial fib. He had post OP psychosis.        2) I believe his migraine is related to a peri-surgical event and change in brain perfusion,  most likely affecting the occipital lobe . I ordered an EEG, with all maneuvers ( photic and Hv ) . Repeat MRI and MRA brain,  I may then order a PET scan for metabolic PET.      3) no change in treatment of narcolepsy needed.   Scotoma  Scotoma refers to an area of decreased or absent eyesight within a field of vision. This is sometimes called a blind spot. It may appear as a dark or gray area of vision loss (visual field defect), and it may be surrounded by a ring or an area of blurred vision. There are two main types of scotoma: Central scotoma. In this type, the blind spot is in the middle of the field of vision. A central scotoma may be caused by a problem with the main nerve for vision (optic nerve). It may also be caused by a problem with the cells in the back of the eye (retina) that collect images and send them to the brain through the optic nerve. Or, it may be caused by a common, age-related eye disease that affects the retina (macular degeneration). Peripheral scotoma. In this type, the blind spot is at the edge of the field of vision. A peripheral scotoma is often caused by a problem with the retina outside the center of vision. Other causes of scotoma include: Glaucoma. Stroke. Migraine aura. A blood clot in the vessels of the eye. Multiple sclerosis. Conditions that interfere with how the retina works. To find the cause, you may need to see a health care provider who specializes in eye conditions (ophthalmologist). Your eye doctor may: Test your  vision, eye movements, and peripheral vision. Shine a light into your eye to make sure that your pupils react normally. Check your eye with a type of microscope that has a bright light (slit lamp). Your eye doctor may dilate your eye. Measure the pressure in your eye. Follow these instructions at home: Take over-the-counter and prescription medicines only as told by your health care provider. Do not drive or use machinery unless your health care provider approves. Work with a biomedical scientist as told by your health care provider. When you are reading or doing other activities that involve small objects or small text, use good lighting and magnifying devices. Do not use any products that contain nicotine or tobacco. These products include cigarettes, chewing tobacco, and vaping devices, such as e-cigarettes. If you need help quitting, ask your health care provider. Pay attention to changes in your symptoms. Tell your health care provider about them or any new symptoms. Keep all follow-up visits. This is important. Contact a health care provider if: Your condition changes. Your symptoms get worse. You have eye pain. Get help right away if you have:  Sudden loss of vision. Any symptoms of a stroke. BE FAST is an easy way to remember the main warning signs of a stroke: B - Balance. Signs are dizziness, sudden  trouble walking, or loss of balance. E - Eyes. Signs are trouble seeing or a sudden change in vision. F - Face. Signs are sudden weakness or numbness of the face, or the face or eyelid drooping on one side. A - Arms. Signs are weakness or numbness in an arm. This happens suddenly and usually on one side of the body. S - Speech. Signs are sudden trouble speaking, slurred speech, or trouble understanding what people say. T - Time. Time to call emergency services. Write down what time symptoms started. Other signs of a stroke, such as: A sudden, severe headache with no known cause. Nausea  or vomiting. Seizure. These symptoms may represent a serious problem that is an emergency. Do not wait to see if the symptoms will go away. Get medical help right away. Call your local emergency services (911 in the [place] Do not drive yourself to the hospital. Summary Scotoma, sometimes called a blind spot, refers to an area of decreased or absent eyesight within a field of vision. To find the cause of your scotoma, you may need to see an ophthalmologist. Get help right away if you have a sudden loss of vision, a sudden headache, or any warning signs or symptoms of a stroke. This information is not intended to replace advice given to you by your health care provider. Make sure you discuss any questions you have with your health care provider. Document Revised: 11/12/2020 Document Reviewed: 11/12/2020 Elsevier Patient Education  2024 [employer].

## 2024-07-12 NOTE — Progress Notes (Signed)
 Provider:  Dedra Gores, Weber  Primary Care Physician:  Christopher Weber., Weber [address] [place] [address]     Referring Provider: Loreli Elsie JONETTA Weber., Weber [address] [place],  [place] [address]          Chief Complaint according to patient   Patient presents with:                HISTORY OF PRESENT ILLNESS:  Christopher Weber is a 78 y.o. male patient who is here for revisit 07/12/2024 for  his migraines, starting after heart surgery 08-26-2023, and relentless, In January he described a 10/10 intensity 09-2023, now intensity 3 / 10 .  There are additional problems, , symptoms of lightheaded, balance impaired, vertigo ( and triggered by fast moving objects or rapid eye movements head movements) an he has visual Aura  (  squiggles, zig-zag lines, and only in  right lower visual field ), it disti orts the  visio spatial perceptions, affects his reading ability without photophoia.   He  reads with one eye closed.   Severe vertigo has happened about 4-5 times  since January, sudden onset severe vertigo - ending with him vomiting- and passing within 30 minutes.   He has benefited form vestibular rehab,  he had prednisone  in march and time has helped as well. His therapist noted  his eyes were not tracking well, -   Emgality  30 days , Nurtec for acute intervention.  Imitrex  still works, but we are heart  concerned.  He started on magnesium - thrionate upon recommendation of Christopher Weber , and it has helped.  He still starts every day feeling a little off kilter,   into the first 2 hours in AM  not often going to progress into a migraine.     His heart EF is 45% since surgery.  Last echo in September 2025.   Primary narcolepsy is controlled, took Xyrem  for years, 8 hours of sleep. Now he sleeps  2- 4 hours without xywav first, then takes his dose ( 1 rst dose ) and often now sleeps another 5-6 hours.    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  He is doing well, has become a grandparent in the recent years, still plays tennis and loves to travel with [name], his wife.  He has developed severe MV regurgitation. He  caught Covid on his flight back to the [place] .      Chief concern according to patient :  In May we went to the [place], [place] and [place], walking all the time, no SOB- then things changed;  He has had a medical exam and was found to have a now severe murmur- 18 months ago , he has had a much lower regurgitation on Echo than now . He developed severe MV regurgitation, with little symptoms, but putting him at risk for pulmonary HTN, and needs urgent treatment now.    He reports 4 migraines a months.  He has a slight dizziness in AM, vestibular migraine- some with aura: zig-zag lines are his visual aura, dominating the right lateral vision.  Some times he has no headache to follow.  Uses Imitrex .  I  like to replace with ubrelvy- nurtec for his cardiac condition.   Review of Systems: Out of a complete 14 system review, the patient complains of only the following symptoms, and all other reviewed systems are negative.:   SLEEPINESS ?  How likely are you to doze in the following situations: 0 = not likely, 1 = slight chance, 2 = moderate chance, 3 = high chance  Sitting and Reading? Watching Television? Sitting inactive in a public place (theater or meeting)? Lying down in the afternoon when circumstances permit? Sitting and talking to someone? Sitting quietly after lunch without alcohol ? In a car, while stopped for a few minutes in traffic? As a passenger in a car for an hour without a break?  7/ 24 points   FSS endorsed at 11/ 63 points.      A fib and  migraine       Social History   Socioeconomic History   Marital status: Married     Spouse name: Christopher Weber   Number of children: 2   Years of education: College   Highest education level: Not on file  Occupational History    Employer: Christopher Weber  Tobacco Use   Smoking status: Never   Smokeless tobacco: Never  Vaping Use   Vaping status: Never Used  Substance and Sexual Activity   Alcohol  use: Yes    Alcohol /week: 4.0 standard drinks of alcohol     Types: 2 Glasses of wine, 2 Cans of beer per week    Comment: 1 glass of wine or beer 3-4 times a week   Drug use: No   Sexual activity: Not on file  Other Topics Concern   Not on file  Social History Narrative   Patient is married ([place]) and lives at home with his wife.   Patient has two adult children.   Patient is working full-time.   Patient has a naval architect, Photographer.   Patient is right-handed.   Patient drinks two cups of coffee in the morning.   Social Drivers of Corporate Investment Banker Strain: Not on file  Food Insecurity: No Food Insecurity (08/30/2023)   Hunger Vital Sign    Worried About Running Out of Food in the Last Year: Never true    Ran Out of Food in the Last Year: Never true  Transportation Needs: No Transportation Needs (08/30/2023)   PRAPARE - Administrator, Civil Service (Medical): No    Lack of Transportation (Non-Medical): No  Physical Activity: Not on file  Stress: Not on file  Social Connections: Not on file    Family History  Problem Relation Age of Onset   Cancer Mother        breast,colon,skin   Heart disease Mother    Heart disease Father 24   Hypertension Father    Prostate cancer Father    Migraines Sister    CVA Maternal Grandmother    Microcephaly Maternal Grandmother    Microcephaly Paternal Grandfather     Past Medical History:  Diagnosis Date   A-fib (HCC)    on Eliquis    Actinic keratoses    Arthritis    Back pain    low   CKD (chronic kidney disease)    Colon polyp 2015   Diverticulitis    Dyspnea    on exertion    Dysrhythmia    A. Fib   Fever blister    episodically   Generalized headaches    GERD (gastroesophageal reflux disease)  Hepatitis A 1985   History of hiatal hernia    Hypersomnia    Knee injuries 2002   left   Migraine headache with aura 08/29/2014   Narcolepsy without cataplexy 04/12/2013    MSLT with  MSL 4.4 minutes.    Pain    facial trigemonal   Rotator cuff (capsule) sprain 08/29/2014   Seborrhea    chronic   Severe mitral regurgitation    Tendinitis    left middle finger    Past Surgical History:  Procedure Laterality Date   CLIPPING OF ATRIAL APPENDAGE  08/29/2023   Procedure: CLIPPING OF ATRIAL APPENDAGE USING MEDTRONIC 5OMM CLIP[;  Surgeon: Maryjane Mt, Weber;  Location: [facility] OR;  Service: Open Heart Surgery;;   COLONOSCOPY  2011-last   every 5 years   FINGER SURGERY  2009/2010   trigger finger of left hand     HERNIA REPAIR  09/13/1961   right inguinal    LASIK  1998 or 99   MAZE N/A 08/29/2023   Procedure: MAZE;  Surgeon: Maryjane Mt, Weber;  Location: [facility] OR;  Service: Open Heart Surgery;  Laterality: N/A;   MITRAL VALVE REPAIR N/A 08/29/2023   Procedure: MITRAL VALVE REPAIR USING SIMULUS ANNULOPLASTY BAND;  Surgeon: Maryjane Mt, Weber;  Location: [facility] OR;  Service: Open Heart Surgery;  Laterality: N/A;   QUADRICEPS TENDON REPAIR Right 04/2022   REPAIR OF PATENT FORAMEN OVALE  08/29/2023   Procedure: CLOSURE OF PATENT FORAMEN OVALE;  Surgeon: Maryjane Mt, Weber;  Location: [facility] OR;  Service: Open Heart Surgery;;   RIGHT/LEFT HEART CATH AND CORONARY ANGIOGRAPHY N/A 08/19/2023   Procedure: RIGHT/LEFT HEART CATH AND CORONARY ANGIOGRAPHY;  Surgeon: Rolan Ezra RAMAN, Weber;  [facility] CV LAB;  Service: Cardiovascular;  Laterality: N/A;   TEE WITHOUT CARDIOVERSION N/A 06/24/2023   Procedure: TRANSESOPHAGEAL ECHOCARDIOGRAM;  Surgeon: Rolan Ezra RAMAN, Weber;  Location: [facility] INVAS[facility] CV LAB;  Service: Cardiovascular;  Laterality: N/A;   TEE WITHOUT CARDIOVERSION  N/A 08/29/2023   Procedure: TRANSESOPHAGEAL ECHOCARDIOGRAM;  Surgeon: Maryjane Mt, Weber;  Location: [facility] OR;  Service: Open Heart Surgery;  Laterality: N/A;   TONSILLECTOMY AND ADENOIDECTOMY  09/13/1958   TOTAL ANKLE REPLACEMENT Right 2022   TRIGGER FINGER RELEASE  08/31/2012   Procedure: MINOR RELEASE TRIGGER FINGER/A-1 PULLEY;  Surgeon: Lamar LULLA Leonor Weber., Weber;  Location: [facility];  Service: Orthopedics;  Laterality: Right;  right long finger   UPPER GASTROINTESTINAL ENDOSCOPY     VASECTOMY       Current Outpatient Medications on File Prior to Visit  Medication Sig Dispense Refill   acetaminophen  (TYLENOL ) 325 MG tablet Take 325 mg by mouth every 6 (six) hours as needed for moderate pain.     apixaban  (ELIQUIS ) 5 MG TABS tablet Take 1 tablet (5 mg total) by mouth 2 (two) times daily. 180 tablet 3   co-enzyme Q-10 30 MG capsule Take 30 mg by mouth daily.     dapagliflozin  propanediol (FARXIGA ) 10 MG TABS tablet Take 1 tablet (10 mg total) by mouth daily before breakfast. 30 tablet 11   fluticasone  (FLONASE ) 50 MCG/ACT nasal spray Place 1 spray into both nostrils daily as needed for allergies or rhinitis.     Galcanezumab -gnlm (EMGALITY ) 120 MG/ML SOAJ Inject 120 mg into the skin every 30 (thirty) days. 1 mL 11   metoprolol  succinate (TOPROL -XL) 25 MG 24 hr tablet Take 0.5 tablets (12.5 mg total) by mouth daily. 45 tablet 3   Multiple Vitamin (MULTIVITAMIN) capsule  Take 1 capsule by mouth 3 (three) times a week.     multivitamin-lutein (OCUVITE-LUTEIN) CAPS capsule Take 1 capsule by mouth daily.     Omeprazole-Sodium Bicarbonate  (ZEGERID) 20-1100 MG CAPS capsule Take 1-2 capsules by mouth at bedtime.     pantoprazole  (PROTONIX ) 40 MG tablet Take 20 mg by mouth daily.     polyethylene glycol (MIRALAX  / GLYCOLAX ) 17 g packet Take 17 g by mouth See admin instructions. Take 17 g daily every night except skip dose on "Sundays     Probiotic Product (ALIGN) 4 MG CAPS Take 4 mg by mouth  3 (three) times a week.     Rimegepant Sulfate (NURTEC) 75 MG TBDP Take 1 tablet (75 mg total) by mouth as needed. 16 tablet 11   sildenafil (REVATIO) 20 MG tablet Take 40 mg by mouth daily as needed (ED).     Sodium Oxybate 500 MG/ML SOLN 2.5 first  dose 2.5 second  dose 5 = grams nightly     spironolactone (ALDACTONE) 25 MG tablet Take 1 tablet (25 mg total) by mouth daily. 90 tablet 3   valACYclovir (VALTREX) 1000 MG tablet Take 500 mg by mouth every Monday, Wednesday, and Friday.     zaleplon (SONATA) 10 MG capsule Take 10 mg by mouth as needed (when flying overseas).     No current facility-administered medications on file prior to visit.    Allergies  Allergen Reactions   Azithromycin Photosensitivity   Tetracyclines & Related     photosensitivity - can take medication but just must avoid being out in the sun    Doxycycline Photosensitivity   Dulcolax Stool Softener [Dss] Nausea And Vomiting   Oxycodone Rash     ECHOCARDIOGRAM COMPLETE [[id number]] Collected: 05/18/24 1105  Order Status: Completed Updated: 05/18/24 1403   S' Lateral 3.00 cm    Single Plane A4C EF 45.7 %    Single Plane A2C EF 38.5 %    Calc EF 40.4 %    Area-P 1/2 4.06 cm2    MV VTI 3.77 cm2    Est EF 40 - 45%  Narrative:        ECHOCARDIOGRAM REPORT       Patient Name:   Johnathyn J Howerter Date of Exam: 05/18/2024 Medical Rec #:  [id number]       Height:       67.0 in Accession #:    [id number]      Weight:       168.2 lb Date of Birth:  08/05/1946      BSA:          1.879 m Patient Age:    77 years        BP:           114/78 mmHg Patient Gender: M               HR:           74"  bpm. Exam Location:  Outpatient    DIAGNOSTIC DATA (LABS, IMAGING, TESTING) - I reviewed patient records, labs, notes, testing and imaging myself where available.  Lab Results  Component Value Date   WBC 5.4 01/05/2024   HGB 15.1 01/05/2024   HCT 43.3 01/05/2024   MCV 87.5 01/05/2024   PLT 238 01/05/2024       Component Value Date/Time   NA 138 06/29/2024 0834   NA 139 12/30/2022 1043   K 4.2 06/29/2024 0834   CL 103 06/29/2024 0834  CO2 25 06/29/2024 0834   GLUCOSE 108 (H) 06/29/2024 0834   BUN 18 06/29/2024 0834   BUN 16 12/30/2022 1043   CREATININE 1.57 (H) 06/29/2024 0834   CALCIUM  9.4 06/29/2024 0834   PROT 6.9 08/25/2023 1230   PROT 6.9 12/30/2022 1043   ALBUMIN  4.0 08/25/2023 1230   ALBUMIN  4.5 12/30/2022 1043   AST 27 08/25/2023 1230   ALT 22 08/25/2023 1230   ALKPHOS 38 08/25/2023 1230   BILITOT 0.8 08/25/2023 1230   BILITOT 0.5 12/30/2022 1043   GFRNONAA 46 (L) 06/29/2024 0834   GFRAA 57 (L) 06/05/2020 1511   Lab Results  Component Value Date   CHOL 193 11/11/2022   HDL 52 11/11/2022   LDLCALC 121 (H) 11/11/2022   TRIG 98 11/11/2022   CHOLHDL 3.7 11/11/2022   Lab Results  Component Value Date   HGBA1C 5.3 08/25/2023   No results found for: VITAMINB12 No results found for: TSH  PHYSICAL EXAM:  Vitals:   07/12/24 1039  BP: 105/77  Pulse: 82   No data found. Body mass index is 25.5 kg/m.   Wt Readings from Last 3 Encounters:  07/12/24 167 lb 11.2 oz (76.1 kg)  05/21/24 170 lb 3.2 oz (77.2 kg)  01/05/24 168 lb 3.2 oz (76.3 kg)     Ht Readings from Last 3 Encounters:  07/12/24 5' 8 (1.727 m)  10/20/23 5' 7 (1.702 m)  09/27/23 5' 7 (1.702 m)      General: The patient is awake, alert and appears not in acute distress and groomed. Head: Normocephalic, atraumatic.  Neck is supple. Mallampati 2,  neck circumference: inches .   Nasal airflow  patent.   Overbite  seen.  Dental status: biological  Well developed, in no acute distress.  Head: normocephalic and atraumatic,. Oropharynx benign  Neck: Supple,   Musculoskeletal: No deformity  Skin no rash or edema Neurological examination    Mentation: Alert oriented to time, place, history taking. Attention span and concentration appropriate. Recent and remote memory intact.  Follows all  commands speech and language fluent.    Cranial nerve : No change in taste and smell,  Pupils were equal round reactive to light extraocular movements were full, visual field were full on confrontational test.  Visual peripheral fields are affected by  streams of lights floaters.  No papilloedema but signs of vitreous detachment.   Facial sensation and strength were normal.   hearing impaired - to soft voices. bilaterally.  Uvula tongue midline. head turning and shoulder shrug were normal and symmetric. Tongue protrusion into cheek strength was normal. Motor: normal bulk and tone, full strength in the BUE, BLE,  Sensory: normal and symmetric to light touch,  Coordination: finger-nose-finger, heel-to-shin bilaterally, no dysmetria Gait and Station: Rising up from seated position without assistance, normal stance,  moderate stride, good arm swing, smooth turning, able to perform tiptoe, and heel walking without difficulty. Tandem gait is steady.   ASSESSMENT AND PLAN :   78 y.o. year old male  here with:    1)  3 major surgeries in 3 years -  3 time long general anesthesia, the longest was the open heart surgery in December 2024.  Fixed PFO, fixed  annulus of mitral valve and repaired mitral valve, P 1 flap- then MAZE procedure for atrial fib. He had post OP psychosis.     2) I believe his migraine is related to a peri-surgical event and change in brain perfusion,  most likely affecting the  occipital lobe . I ordered an EEG, with all maneuvers ( photic and Hv ) . Repeat MRI and MRA brain,  I may then order a PET scan for metabolic PET.    3) no change in treatment of narcolepsy needed.   Continue with MAGNESIUm  L - thrionate       I would like to thank Christopher Weber., Weber and Christopher Weber., Weber [address] [place],  [place] [address] for allowing me to meet with this pleasant patient.   Sleep Clinic Patients are generally offered input on sleep hygiene, life style  changes and how to improve compliance with medical treatment where applicable. Review and reiteration of good sleep hygiene measures is offered to any sleep clinic patient, be it in the first consultation or with any follow up visits.    Any patient with sleepiness should be cautioned not to drive, work at heights, or operate dangerous or heavy equipment when feeling tired or sleepy.      The patient will be seen in follow-up in the sleep clinic at [facility] for discussion of test results, sleep related symptoms and treatment compliance review, further management strategies, etc.   The referring provider will be notified of the test results.   The patient's condition requires frequent monitoring and adjustments in the treatment plan, reflecting the ongoing complexity of care.  This provider is the continuing focal point for all needed services for this condition.  After spending a total time of  40  minutes face to face and time for  history taking, physical and neurologic examination, review of laboratory studies,  personal review of imaging studies, reports and results of other testing and review of referral information / records as far as provided in visit,   Electronically signed by: Christopher Gores, Weber 07/12/2024 10:59 AM  [name] Board certified by The [employer] of Sleep Medicine and Diplomate of the [employer] of Sleep Medicine. Board certified In Neurology through the ABPN, Fellow of the [employer] of Neu[employer].

## 2024-07-15 ENCOUNTER — Encounter: Payer: Self-pay | Admitting: Neurology

## 2024-07-17 ENCOUNTER — Telehealth: Payer: Self-pay | Admitting: Pharmacist

## 2024-07-17 NOTE — Telephone Encounter (Signed)
 Pharmacy Patient Advocate Encounter   Received notification from CoverMyMeds that prior authorization for Nurtec 75MG  dispersible tablets is required/requested.   Insurance verification completed.   The patient is insured through [facility].   Per test claim: PA required; PA submitted to above mentioned insurance via CoverMyMeds Key/confirmation #/EOC  [id number] Status is pending

## 2024-07-17 NOTE — Telephone Encounter (Signed)
 Pharmacy Patient Advocate Encounter  Received notification from [facility] that Prior Authorization for Nurtec has been APPROVED from 07/17/2024 to 07/17/2025   PA #/Case ID/Reference #: PA Case ID #: [id number]

## 2024-07-17 NOTE — Telephone Encounter (Signed)
 Pharmacy Patient Advocate Encounter  Received notification from [facility] that Prior Authorization for NURTEC 75 MG PO TBDP has been APPROVED from 07/17/2024 to 07/17/2025   PA #/Case ID/Reference #: [id number]

## 2024-07-18 ENCOUNTER — Other Ambulatory Visit: Payer: Self-pay | Admitting: Medical Genetics

## 2024-07-18 DIAGNOSIS — Z006 Encounter for examination for normal comparison and control in clinical research program: Secondary | ICD-10-CM

## 2024-07-19 ENCOUNTER — Ambulatory Visit (INDEPENDENT_AMBULATORY_CARE_PROVIDER_SITE_OTHER): Admitting: Neurology

## 2024-07-19 DIAGNOSIS — Z8673 Personal history of transient ischemic attack (TIA), and cerebral infarction without residual deficits: Secondary | ICD-10-CM

## 2024-07-19 DIAGNOSIS — I639 Cerebral infarction, unspecified: Secondary | ICD-10-CM

## 2024-07-19 DIAGNOSIS — R569 Unspecified convulsions: Secondary | ICD-10-CM

## 2024-07-19 DIAGNOSIS — R519 Headache, unspecified: Secondary | ICD-10-CM

## 2024-07-23 ENCOUNTER — Ambulatory Visit (HOSPITAL_COMMUNITY)
Admission: RE | Admit: 2024-07-23 | Discharge: 2024-07-23 | Disposition: A | Discharge status: Home / Self Care | Source: Ambulatory Visit | Attending: Neurology | Admitting: Neurology

## 2024-07-23 ENCOUNTER — Ambulatory Visit (HOSPITAL_COMMUNITY)
Admission: RE | Admit: 2024-07-23 | Discharge: 2024-07-23 | Disposition: A | Source: Ambulatory Visit | Attending: Neurology | Admitting: Neurology

## 2024-07-23 DIAGNOSIS — Z8673 Personal history of transient ischemic attack (TIA), and cerebral infarction without residual deficits: Secondary | ICD-10-CM | POA: Diagnosis not present

## 2024-07-23 DIAGNOSIS — R519 Headache, unspecified: Secondary | ICD-10-CM | POA: Insufficient documentation

## 2024-07-23 DIAGNOSIS — I639 Cerebral infarction, unspecified: Secondary | ICD-10-CM

## 2024-07-23 DIAGNOSIS — G43909 Migraine, unspecified, not intractable, without status migrainosus: Secondary | ICD-10-CM | POA: Diagnosis not present

## 2024-07-23 MED ORDER — GADOBUTROL 1 MMOL/ML IV SOLN
7.5000 mL | Freq: Once | INTRAVENOUS | Status: AC | PRN
  Administered 2024-07-23: 7.5 mL via INTRAVENOUS

## 2024-07-24 NOTE — Procedures (Signed)
    History:  78 year old with paroxysmal events concerning for seizures  EEG classification: Awake and drowsy  Duration: 26 minutes   Technical aspects: This EEG study was done with scalp electrodes positioned according to the 10-20 International system of electrode placement. Electrical activity was reviewed with band pass filter of 1-70Hz , sensitivity of 7 uV/mm, display speed of 56mm/sec with a 60Hz  notched filter applied as appropriate. EEG data were recorded continuously and digitally stored.   Description of the recording: The background rhythms of this recording consists of a fairly well modulated medium amplitude alpha rhythm of 9 Hz that is reactive to eye opening and closure. Present in the anterior head region is a 15-20 Hz beta activity. Photic stimulation was performed, did not show any abnormalities. Hyperventilation was also performed, did not show any abnormalities. Drowsiness was manifested by background fragmentation. No abnormal epileptiform discharges seen during this recording. There was no focal slowing. There were no electrographic seizure identified.   Abnormality: None   Impression: This is a normal awake and drowsy EEG. No evidence of interictal epileptiform discharges. Normal EEGs, however, do not rule out epilepsy.    Aamori Mcmasters, MD [name]

## 2024-07-25 ENCOUNTER — Ambulatory Visit: Payer: Self-pay | Admitting: Neurology

## 2024-07-25 DIAGNOSIS — I480 Paroxysmal atrial fibrillation: Secondary | ICD-10-CM

## 2024-07-25 DIAGNOSIS — G43809 Other migraine, not intractable, without status migrainosus: Secondary | ICD-10-CM

## 2024-07-25 DIAGNOSIS — G47411 Narcolepsy with cataplexy: Secondary | ICD-10-CM

## 2024-07-25 DIAGNOSIS — G43109 Migraine with aura, not intractable, without status migrainosus: Secondary | ICD-10-CM

## 2024-07-25 DIAGNOSIS — Z8673 Personal history of transient ischemic attack (TIA), and cerebral infarction without residual deficits: Secondary | ICD-10-CM

## 2024-07-25 DIAGNOSIS — R42 Dizziness and giddiness: Secondary | ICD-10-CM

## 2024-08-10 LAB — GENECONNECT MOLECULAR SCREEN: Genetic Analysis Overall Interpretation: NEGATIVE

## 2024-08-12 ENCOUNTER — Other Ambulatory Visit (HOSPITAL_COMMUNITY): Payer: Self-pay | Admitting: Cardiology

## 2024-08-15 NOTE — Addendum Note (Signed)
 Addended by: CHALICE SAUNAS on: 08/15/2024 03:55 PM   Modules accepted: Orders

## 2024-08-16 ENCOUNTER — Encounter (HOSPITAL_COMMUNITY): Payer: Self-pay | Admitting: Cardiology

## 2024-08-30 ENCOUNTER — Ambulatory Visit: Payer: Self-pay | Admitting: Neurology

## 2024-08-30 ENCOUNTER — Encounter (HOSPITAL_COMMUNITY)
Admission: RE | Admit: 2024-08-30 | Discharge: 2024-08-30 | Disposition: A | Discharge status: Home / Self Care | Source: Ambulatory Visit | Attending: Neurology | Admitting: Neurology

## 2024-08-30 DIAGNOSIS — R4189 Other symptoms and signs involving cognitive functions and awareness: Secondary | ICD-10-CM | POA: Diagnosis present

## 2024-08-30 DIAGNOSIS — G43809 Other migraine, not intractable, without status migrainosus: Secondary | ICD-10-CM | POA: Diagnosis present

## 2024-08-30 DIAGNOSIS — Z8673 Personal history of transient ischemic attack (TIA), and cerebral infarction without residual deficits: Secondary | ICD-10-CM | POA: Insufficient documentation

## 2024-08-30 DIAGNOSIS — G47411 Narcolepsy with cataplexy: Secondary | ICD-10-CM | POA: Diagnosis present

## 2024-08-30 DIAGNOSIS — I480 Paroxysmal atrial fibrillation: Secondary | ICD-10-CM | POA: Diagnosis present

## 2024-08-30 DIAGNOSIS — R42 Dizziness and giddiness: Secondary | ICD-10-CM | POA: Diagnosis present

## 2024-08-30 DIAGNOSIS — G43109 Migraine with aura, not intractable, without status migrainosus: Secondary | ICD-10-CM | POA: Insufficient documentation

## 2024-08-30 LAB — GLUCOSE, CAPILLARY: Glucose-Capillary: 110 mg/dL — ABNORMAL HIGH (ref 70–99)

## 2024-08-30 MED ORDER — FLUDEOXYGLUCOSE F - 18 (FDG) INJECTION
9.9000 | Freq: Once | INTRAVENOUS | Status: AC | PRN
  Administered 2024-08-30: 11:00:00 9.9 via INTRAVENOUS

## 2024-08-30 NOTE — Telephone Encounter (Addendum)
 Spoke to patient gave NM PET Metabolic Brain   results Pt expressed understanding and thanked me for calling . Pt aware results were sent  to PCP

## 2024-08-30 NOTE — Telephone Encounter (Signed)
-----   Message from Dedra Gores, MD sent at 08/30/2024  1:50 PM EST ----- We are not looking for a dementia process but were looking for hippocampal hypometabolism- and this is not  present. This means a good result,   no indication of hypoperfusion damage to the short term memory center

## 2024-09-17 ENCOUNTER — Ambulatory Visit (HOSPITAL_COMMUNITY): Admitting: Cardiology

## 2024-10-05 ENCOUNTER — Ambulatory Visit (HOSPITAL_COMMUNITY)
Admission: RE | Admit: 2024-10-05 | Discharge: 2024-10-05 | Disposition: A | Source: Ambulatory Visit | Attending: Cardiology | Admitting: Cardiology

## 2024-10-05 ENCOUNTER — Ambulatory Visit (HOSPITAL_COMMUNITY): Payer: Self-pay | Admitting: Cardiology

## 2024-10-05 VITALS — BP 102/60 | HR 73 | Wt 172.4 lb

## 2024-10-05 DIAGNOSIS — I429 Cardiomyopathy, unspecified: Secondary | ICD-10-CM | POA: Insufficient documentation

## 2024-10-05 DIAGNOSIS — Z7901 Long term (current) use of anticoagulants: Secondary | ICD-10-CM | POA: Diagnosis not present

## 2024-10-05 DIAGNOSIS — Z8774 Personal history of (corrected) congenital malformations of heart and circulatory system: Secondary | ICD-10-CM | POA: Diagnosis not present

## 2024-10-05 DIAGNOSIS — Z7984 Long term (current) use of oral hypoglycemic drugs: Secondary | ICD-10-CM | POA: Insufficient documentation

## 2024-10-05 DIAGNOSIS — I480 Paroxysmal atrial fibrillation: Secondary | ICD-10-CM | POA: Diagnosis present

## 2024-10-05 DIAGNOSIS — N1831 Chronic kidney disease, stage 3a: Secondary | ICD-10-CM | POA: Diagnosis not present

## 2024-10-05 DIAGNOSIS — I517 Cardiomegaly: Secondary | ICD-10-CM | POA: Insufficient documentation

## 2024-10-05 DIAGNOSIS — G4733 Obstructive sleep apnea (adult) (pediatric): Secondary | ICD-10-CM | POA: Diagnosis not present

## 2024-10-05 DIAGNOSIS — I059 Rheumatic mitral valve disease, unspecified: Secondary | ICD-10-CM | POA: Insufficient documentation

## 2024-10-05 DIAGNOSIS — G47419 Narcolepsy without cataplexy: Secondary | ICD-10-CM | POA: Diagnosis not present

## 2024-10-05 DIAGNOSIS — K219 Gastro-esophageal reflux disease without esophagitis: Secondary | ICD-10-CM | POA: Diagnosis not present

## 2024-10-05 DIAGNOSIS — I5022 Chronic systolic (congestive) heart failure: Secondary | ICD-10-CM | POA: Insufficient documentation

## 2024-10-05 DIAGNOSIS — Z79899 Other long term (current) drug therapy: Secondary | ICD-10-CM | POA: Diagnosis not present

## 2024-10-05 DIAGNOSIS — I2510 Atherosclerotic heart disease of native coronary artery without angina pectoris: Secondary | ICD-10-CM | POA: Insufficient documentation

## 2024-10-05 LAB — CBC
HCT: 43.9 % (ref 39.0–52.0)
Hemoglobin: 15.2 g/dL (ref 13.0–17.0)
MCH: 31.2 pg (ref 26.0–34.0)
MCHC: 34.6 g/dL (ref 30.0–36.0)
MCV: 90.1 fL (ref 80.0–100.0)
Platelets: 197 K/uL (ref 150–400)
RBC: 4.87 MIL/uL (ref 4.22–5.81)
RDW: 13.1 % (ref 11.5–15.5)
WBC: 8.3 K/uL (ref 4.0–10.5)
nRBC: 0 % (ref 0.0–0.2)

## 2024-10-05 LAB — LIPID PANEL
Cholesterol: 172 mg/dL (ref 0–200)
HDL: 52 mg/dL
LDL Cholesterol: 110 mg/dL — ABNORMAL HIGH (ref 0–99)
Total CHOL/HDL Ratio: 3.3 ratio
Triglycerides: 49 mg/dL
VLDL: 10 mg/dL (ref 0–40)

## 2024-10-05 LAB — PRO BRAIN NATRIURETIC PEPTIDE: Pro Brain Natriuretic Peptide: 185 pg/mL

## 2024-10-05 LAB — BASIC METABOLIC PANEL WITH GFR
Anion gap: 10 (ref 5–15)
BUN: 21 mg/dL (ref 8–23)
CO2: 26 mmol/L (ref 22–32)
Calcium: 9.2 mg/dL (ref 8.9–10.3)
Chloride: 102 mmol/L (ref 98–111)
Creatinine, Ser: 1.42 mg/dL — ABNORMAL HIGH (ref 0.61–1.24)
GFR, Estimated: 51 mL/min — ABNORMAL LOW
Glucose, Bld: 98 mg/dL (ref 70–99)
Potassium: 4.7 mmol/L (ref 3.5–5.1)
Sodium: 138 mmol/L (ref 135–145)

## 2024-10-05 MED ORDER — SPIRONOLACTONE 25 MG PO TABS: 25.0000 mg | ORAL_TABLET | Freq: Every evening | ORAL | Status: AC

## 2024-10-05 MED ORDER — METOPROLOL SUCCINATE ER 25 MG PO TB24: 12.5000 mg | ORAL_TABLET | Freq: Every evening | ORAL | Status: AC

## 2024-10-05 NOTE — Patient Instructions (Signed)
 CHANGE Spironolactone  and Toprol  to nightly.  Labs done today, your results will be available in MyChart, we will contact you for abnormal readings.  Your physician has requested that you have an echocardiogram. Echocardiography is a painless test that uses sound waves to create images of your heart. It provides your doctor with information about the size and shape of your heart and how well your hearts chambers and valves are working. This procedure takes approximately one hour. There are no restrictions for this procedure. Please do NOT wear cologne, perfume, aftershave, or lotions (deodorant is allowed). Please arrive 15 minutes prior to your appointment time.  Please note: We ask at that you not bring children with you during ultrasound (echo/ vascular) testing. Due to room size and safety concerns, children are not allowed in the ultrasound rooms during exams. Our front office staff cannot provide observation of children in our lobby area while testing is being conducted. An adult accompanying a patient to their appointment will only be allowed in the ultrasound room at the discretion of the ultrasound technician under special circumstances. We apologize for any inconvenience.  Your physician recommends that you schedule a follow-up appointment in: 4 months.  If you have any questions or concerns before your next appointment please send us  a message through [facility] or call our office at [phone].    TO LEAVE A MESSAGE FOR THE NURSE SELECT OPTION 2, PLEASE LEAVE A MESSAGE INCLUDING: YOUR NAME DATE OF BIRTH CALL BACK NUMBER REASON FOR CALL**this is important as we prioritize the call backs  YOU WILL RECEIVE A CALL BACK THE SAME DAY AS LONG AS YOU CALL BEFORE 4:00 PM  At the [facility], you and your health needs are our priority. As part of our continuing mission to provide you with exceptional heart care, we have created designated Provider Care Teams. These Care  Teams include your primary Cardiologist (physician) and Advanced Practice Providers (APPs- Physician Assistants and Nurse Practitioners) who all work together to provide you with the care you need, when you need it.   You may see any of the following providers on your designated Care Team at your next follow up: Dr Toribio Fuel Dr Ezra Shuck Dr. Morene Brownie Greig [place], NP Caffie Shed, [place] [facility] [place], [place] Beckey Coe, NP Jordan Lee, NP Ellouise Class, NP Tinnie Redman, PharmD Jaun Bash, PharmD   Please be sure to bring in all your medications bottles to every appointment.    Thank you for choosing [facility]

## 2024-10-05 NOTE — Progress Notes (Signed)
 "  ADVANCED HF CLINIC NOTE   PCP: Loreli Elsie JONETTA Mickey., MD Cardiology: Dr. Rolan  Chief Complaint: Mitral Regurgitation Follow-up  HPI: 79 y.o. with history of narcolepsy and laryngopharyngeal reflux, mitral regurgitation and atrial fibrillation.  In early 2020, he developed dyspnea, a sensation that he was breathing through a straw. He had an extensive workup in 2020 with a normal ETT, normal PFTs, and normal high resolution CT chest.  Ultimately, he was diagnosed with laryngopharyngeal reflux. Symptoms were improved considerably with Protonix  + Zegerid.    With symptoms of LPR controlled, he did not have any exertional dyspnea or chest pain. In 11/21, however, he cut back on Protonix  and Zegerid, and dyspnea returned.  In addition, on 11/31/21, he developed tachypalpitations.  His Apple Watch showed atrial fibrillation. He brought the tracings to show me, they were definitely atrial fibrillation. This lasted for a few hours when resolved.  In hindsight, in 4/20, he had a very similar episode of tachypalpitations that was likely atrial fibrillation as well.  He has increased his Protonix  and Zegerid again and dyspnea has resolved.  He had atrial fibrillation again on 08/30/20 documented by Apple Watch.  He does not have OSA symptoms and is followed by Dr. Chalice for narcolepsy, which is controlled.    Echo 12/21, EF 55-60%, mild mitral valve prolapse with probably mild MR.    Coronary CTA was done in 3/22.  This was a difficult study but calcium  score 0 and no evidence for significant disease.  CT neck showed asymmetric right vallecula enlargement.  He saw ENT with examination that showed no evidence for malignancy.  He had total ankle replacement in 5/22.   Patient had diverticulitis in 9/22, treated as outpatient.   Patient tore his quadriceps tendon in 8/23 and had recent surgery to repair it.  He had a run of atrial fibrillation lasting 3-4 hours about 3 days after surgery.    Echo 8/23  EF 55%, normal RV, moderate MR with posterior leaflet prolapse.    Zio 6/24 showed no definite atrial fibrillation noted. Sleep study 5/24 showed mild sleep apnea.    Echo in 9/24 showed EF 50-55% with low normal RV function, there was mitral valve prolapse with highly eccentric mitral regurgitation that is likely severe.   L/RHC (12/24) showed minimal nonobstructive CAD; RA 3, PA 18/6 (13), CO/CI (Fick) 4.18/2.25  Admitted 12/17-12/20/24 for mitral valve repair, MAZE, LAA clip, and closure of PFO with Dr. Maryjane.   Post-op echo in 3/25 showed EF 45-50%, normal RV, s/p MV repair with trivial MR and no mitral stenosis.  Repeat echo in 9/25 showed EF 40-45%, global hypokinesis, moderate RV enlargement with mildly decreased RV systolic function, PASP 20, s/p MV repair with trivial MR and no MS, mild AI.   Patient returns for followup of mitral regurgitation, cardiomyopathy and atrial fibrillation.  He is doing well in general.  Stamina is not as good as before MV repair, he gets tired after working about 1.5 hrs in the yard and will have to rest.  Still working regularly.  Works out in gym 4 days/week lifting weights and using treadmill and recumbent bike. No significant exertional dyspnea and no chest pain.  Weight stable. No palpitations.   EKG (personally reviewed): NSR, normal  Labs (4/24): K 4.5, SCr 1.32 Labs (8/24): K 4.3, creatinine 1.1, LDL 99, TGs 50, hgb 14 Labs( 12/24): K 4.5, creatinine 1.08 Labs (4/25): BNP 41 Labs (5/25): K 4.9, creatinine 1.55 Labs (10/25): K  4.2, creatinine 1.57  PMH: 1. HAV in 1985 2. Narcolepsy 3. Laryngopharyngeal reflux 4. Atrial fibrillation: Paroxysmal.  - Zio 6/24: no definite a fib noted - Sleep study 5/24: mild sleep apnea 5. ETT (10/20): 11.6 METS, no ECG changes => normal study.  6. PFTs (10/20): Normal.  7. High resolution CT chest: Interstitial lung disease.  8. Mitral regurgitation: Echo (12/21) with EF 55-60%, mild mitral valve prolapse  with probable mild MR - Echo (8/23): EF 55%, normal RV, moderate MR with posterior leaflet prolapse.  - Echo (9/24): 50-55% with low normal RV function, there is mitral valve prolapse with highly eccentric mitral regurgitation that is likely severe.  - 12/24 s/p Mitral valve repair with LAA clip, PFO closure.  - Echo (3/25): EF 45-50%, normal RV, s/p MV repair with trivial MR and no mitral stenosis.  9. Coronary CTA (3/22): Calcium  score 0, no evidence for significant CAD.  - LHC (12/24): minimal nonobstructive CAD.  10. S/p total ankle replacement in 5/22.  11. Diverticulitis 9/22 12. Migraines: He has vestibular migraines, followed by Dr. Chalice.  - MRI brain 11/25: No CVA 13. Chronic systolic CHF: Nonischemic cardiomyopathy, noted post-MV repair.  - Echo (9/25): EF 40-45%, global hypokinesis, moderate RV enlargement with mildly decreased RV systolic function, PASP 20, s/p MV repair with trivial MR and no MS, mild AI 14. 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   FH: No atrial fibrillation.  Father had mitral regurgitation.   ROS: All systems reviewed and negative except as per HPI.   Current Outpatient Medications  Medication Sig Dispense Refill   acetaminophen  (TYLENOL ) 325 MG tablet Take 325 mg by mouth every 6 (six) hours as needed for moderate pain.     Cetirizine HCl (ZYRTEC PO) Take 1 tablet by mouth daily.     co-enzyme Q-10 30 MG capsule Take 30 mg by mouth daily.     dapagliflozin  propanediol (FARXIGA ) 10 MG TABS tablet Take 1 tablet (10 mg total) by mouth daily before breakfast. 30 tablet 11   ELIQUIS  5 MG TABS tablet TAKE 1 TABLET(5 MG) BY MOUTH TWICE DAILY 180 tablet 3   fluticasone  (FLONASE ) 50 MCG/ACT nasal spray Place 1 spray into both nostrils daily as needed for allergies or rhinitis.     Galcanezumab -gnlm (EMGALITY ) 120 MG/ML SOAJ Inject 120 mg into the skin every 30 (thirty) days. 1 mL 11   Multiple Vitamin  (MULTIVITAMIN) capsule Take 1 capsule by mouth 3 (three) times a week.     multivitamin-lutein (OCUVITE-LUTEIN) CAPS capsule Take 1 capsule by mouth daily.     Omeprazole-Sodium Bicarbonate  (ZEGERID) 20-1100 MG CAPS capsule Take 1-2 capsules by mouth at bedtime.     pantoprazole  (PROTONIX ) 40 MG tablet Take 20 mg by mouth daily.     polyethylene glycol (MIRALAX  / GLYCOLAX ) 17 g packet Take 17 g by mouth See admin instructions. Take 17 g daily every night except skip dose on Sundays     Probiotic Product (ALIGN) 4 MG CAPS Take 4 mg by mouth 3 (three) times a week.     Rimegepant Sulfate  (NURTEC) 75 MG TBDP Take 1 tablet (75 mg total) by mouth as needed. 16 tablet 11   sildenafil (REVATIO) 20 MG tablet Take 40 mg by mouth daily as needed (ED).     Sodium Oxybate  500 MG/ML SOLN 2.5 first  dose 2.5 second  dose 5 = grams nightly     valACYclovir  (VALTREX ) 1000  MG tablet Take 500 mg by mouth every Monday, Wednesday, and Friday.     zaleplon  (SONATA ) 10 MG capsule Take 10 mg by mouth as needed (when flying overseas).     metoprolol  succinate (TOPROL -XL) 25 MG 24 hr tablet Take 0.5 tablets (12.5 mg total) by mouth at bedtime.     spironolactone  (ALDACTONE ) 25 MG tablet Take 1 tablet (25 mg total) by mouth at bedtime.     No current facility-administered medications for this encounter.   BP 102/60   Pulse 73   Wt 78.2 kg (172 lb 6.4 oz)   SpO2 97%   BMI 26.21 kg/m   Filed Weights   10/05/24 0921  Weight: 78.2 kg (172 lb 6.4 oz)  General: NAD Neck: No JVD, no thyromegaly or thyroid  nodule.  Lungs: Clear to auscultation bilaterally with normal respiratory effort. CV: Nondisplaced PMI.  Heart regular S1/S2, no S3/S4, no murmur.  No peripheral edema.  No carotid bruit.  Normal pedal pulses.  Abdomen: Soft, nontender, no hepatosplenomegaly, no distention.  Skin: Intact without lesions or rashes.  Neurologic: Alert and oriented x 3.  Psych: Normal affect. Extremities: No clubbing or cyanosis.   HEENT: Normal.   Assessment/plan: 1. Atrial fibrillation: Paroxysmal.  CHADSVASC 1 (age).  Only mild OSA on sleep study (followed by Dr. Chalice already for narcolepsy).  Not a heavy drinker.  Weight is ideal.  Echo in 9/24 showed severe MR with mitral valve prolapse, now s/p MV repair.  The mitral valve disease may have been driving atrial fibrillation.  He had MAZE and LAA clip with MV repair.  NSR today, no palpitations.  - On Eliquis  5 mg bid. Check CBC.  2. HF with intermediate range EF: Echoes post-MV repair have showed some drop in EF, likely related to underlying damage from long-standing mitral regurgitation.  Post-op echo in 3/25 showed EF 45-50%, normal RV, s/p MV repair with trivial MR and no mitral stenosis.  Echo in 9/25 showed EF 40-45%, global hypokinesis, moderate RV enlargement with mildly decreased RV systolic function, PASP 20, s/p MV repair with trivial MR and no MS, mild AI. He is not volume overloaded on exam, NYHA class I symptoms.  - Continue Toprol  XL 12.5 daily and spironolactone  25 daily.  I will have him take these at bedtime to see if that helps with his stamina.  - Continue Farxiga  10 mg daily.  - Repeat echo in 3/36.  If EF remains low, will aim to start valsartan.  I do not think that his BP would tolerate Entresto (tends to run low).  - BMET/BNP today.  3. Mitral valve prolapse/MR: Father had mitral regurgitation. Moderate on echo in 8/23. Echo 9/24 echo showed EF in the 50-55% range with mitral valve prolapse and probably severe highly eccentric MR. TEE 12/24 showed severe MR with flail P1 and highly eccentric jet with small PFO. [facility] 12/24 with minimal CAD and normal filling pressure. He is now s/p MV repair + MAZE + LAA clip and PFO closure on 08/30/23. Echo in 9/25 showed stable MV repair with trivial MR, no stenosis.   - Repeat echo as above.  - Antibiotics with dental work.  4. CKD stage 3a: BMET today.   Follow up 4 months.  I spent 32 minutes reviewing  records, interviewing/examining patient, and managing orders.   Ezra Shuck, MD 10/05/2024 "

## 2024-12-13 ENCOUNTER — Ambulatory Visit: Admitting: Neurology

## 2025-01-24 ENCOUNTER — Other Ambulatory Visit (HOSPITAL_COMMUNITY)

## 2025-01-24 ENCOUNTER — Ambulatory Visit (HOSPITAL_COMMUNITY): Admitting: Cardiology

## 2025-02-01 ENCOUNTER — Other Ambulatory Visit (HOSPITAL_COMMUNITY)

## 2025-02-01 ENCOUNTER — Ambulatory Visit (HOSPITAL_COMMUNITY): Admitting: Cardiology
